# Patient Record
Sex: Female | Born: 1950 | Race: Black or African American | Hispanic: No | Smoking: Never smoker
Health system: Southern US, Community
[De-identification: ages and names within clinical notes are randomized; demographics above are authoritative.]

## PROBLEM LIST (undated history)

## (undated) DIAGNOSIS — I10 Essential (primary) hypertension: Secondary | ICD-10-CM

## (undated) DIAGNOSIS — K219 Gastro-esophageal reflux disease without esophagitis: Secondary | ICD-10-CM

## (undated) DIAGNOSIS — E210 Primary hyperparathyroidism: Secondary | ICD-10-CM

## (undated) DIAGNOSIS — H269 Unspecified cataract: Secondary | ICD-10-CM

## (undated) DIAGNOSIS — M810 Age-related osteoporosis without current pathological fracture: Secondary | ICD-10-CM

## (undated) DIAGNOSIS — C801 Malignant (primary) neoplasm, unspecified: Secondary | ICD-10-CM

## (undated) DIAGNOSIS — G459 Transient cerebral ischemic attack, unspecified: Secondary | ICD-10-CM

## (undated) HISTORY — DX: Essential (primary) hypertension: I10

## (undated) HISTORY — PX: ABDOMINAL HYSTERECTOMY: SHX81

## (undated) HISTORY — DX: Gastro-esophageal reflux disease without esophagitis: K21.9

## (undated) HISTORY — DX: Unspecified cataract: H26.9

---

## 1989-09-01 HISTORY — PX: PARTIAL HYSTERECTOMY: SHX80

## 1997-09-01 DIAGNOSIS — C801 Malignant (primary) neoplasm, unspecified: Secondary | ICD-10-CM

## 1997-09-01 HISTORY — PX: BREAST LUMPECTOMY: SHX2

## 1997-09-01 HISTORY — DX: Malignant (primary) neoplasm, unspecified: C80.1

## 2000-12-26 DIAGNOSIS — G459 Transient cerebral ischemic attack, unspecified: Secondary | ICD-10-CM

## 2000-12-26 HISTORY — DX: Transient cerebral ischemic attack, unspecified: G45.9

## 2006-08-10 ENCOUNTER — Ambulatory Visit (HOSPITAL_COMMUNITY): Admission: RE | Admit: 2006-08-10 | Discharge: 2006-08-10 | Payer: Self-pay | Admitting: Ophthalmology

## 2006-08-17 ENCOUNTER — Ambulatory Visit (HOSPITAL_COMMUNITY): Admission: RE | Admit: 2006-08-17 | Discharge: 2006-08-17 | Payer: Self-pay | Admitting: Ophthalmology

## 2013-03-03 ENCOUNTER — Encounter (HOSPITAL_COMMUNITY): Payer: Self-pay

## 2013-03-03 ENCOUNTER — Ambulatory Visit (HOSPITAL_COMMUNITY)
Admission: RE | Admit: 2013-03-03 | Discharge: 2013-03-03 | Disposition: A | Discharge status: Home / Self Care | Payer: 59 | Source: Ambulatory Visit | Attending: Endocrinology | Admitting: Endocrinology

## 2013-03-03 HISTORY — DX: Age-related osteoporosis without current pathological fracture: M81.0

## 2013-03-03 HISTORY — DX: Malignant (primary) neoplasm, unspecified: C80.1

## 2013-03-03 HISTORY — DX: Transient cerebral ischemic attack, unspecified: G45.9

## 2013-03-03 MED ORDER — SODIUM CHLORIDE 0.9 % IV SOLN
Freq: Once | INTRAVENOUS | Status: AC
  Administered 2013-03-03: 14:00:00 via INTRAVENOUS

## 2013-03-03 MED ORDER — ZOLEDRONIC ACID 4 MG/5ML IV CONC
4.0000 mg | Freq: Once | INTRAVENOUS | Status: AC
  Administered 2013-03-03: 4 mg via INTRAVENOUS
  Filled 2013-03-03: qty 5

## 2013-03-09 ENCOUNTER — Other Ambulatory Visit (HOSPITAL_COMMUNITY): Payer: Self-pay | Admitting: Endocrinology

## 2013-03-11 ENCOUNTER — Emergency Department (HOSPITAL_COMMUNITY)
Admission: EM | Admit: 2013-03-11 | Discharge: 2013-03-11 | Disposition: A | Discharge status: Home / Self Care | Payer: 59 | Source: Home / Self Care | Attending: Emergency Medicine | Admitting: Emergency Medicine

## 2013-03-11 ENCOUNTER — Encounter (HOSPITAL_COMMUNITY): Payer: Self-pay | Admitting: Emergency Medicine

## 2013-03-11 DIAGNOSIS — M79609 Pain in unspecified limb: Secondary | ICD-10-CM

## 2013-03-11 DIAGNOSIS — T50905A Adverse effect of unspecified drugs, medicaments and biological substances, initial encounter: Secondary | ICD-10-CM

## 2013-03-11 DIAGNOSIS — M79605 Pain in left leg: Secondary | ICD-10-CM

## 2013-03-11 DIAGNOSIS — T887XXA Unspecified adverse effect of drug or medicament, initial encounter: Secondary | ICD-10-CM

## 2013-03-11 DIAGNOSIS — M79604 Pain in right leg: Secondary | ICD-10-CM

## 2013-03-11 LAB — MAGNESIUM: Magnesium: 2.6 mg/dL — ABNORMAL HIGH (ref 1.5–2.5)

## 2013-03-11 MED ORDER — ACETAMINOPHEN-CODEINE #3 300-30 MG PO TABS: 1.0000 | ORAL_TABLET | Freq: Four times a day (QID) | ORAL | Status: DC | PRN

## 2013-03-11 NOTE — ED Notes (Signed)
Pt reports pain that starts on left shin and radiates to thigh; has noticed a bump/raise on shin that she's concerned about ... Also has similar sxs on right side but are mild... Reports she was given Zoledronic Acid IV for high calcium on 03/03/13 and was told she would be experiencing joint pain; she's been taking ibup w/temp relief and tyle w/no relief... She is alert w/no signs of acute distress.

## 2013-03-11 NOTE — ED Notes (Signed)
Phone number verified.

## 2013-03-11 NOTE — ED Provider Notes (Addendum)
History    CSN: [id number] Arrival date & time 03/11/13  1910  First MD Initiated Contact with Patient 03/11/13 1930     Chief Complaint  Patient presents with  . Leg Pain   (Consider location/radiation/quality/duration/timing/severity/associated sxs/prior Treatment) HPI Comments: Patient presents urgent care this evening complaining of ongoing muscular pains that have somewhat subsided since Monday with some residual pain on her right lower leg but predominantly hurting the most in her left lower leg. Patient received IV Zoledronic for Hypercalcemia this past Monday. She was told by both the nurse and her Dr. that she was going to probably experience some significant pain. She has taken some ibuprofen some partial relief most of the other areas that have been hurting have improved except for her legs and at this point most pronounced in her left lower extremity.   Patient denies any swelling, any recent trauma or injury.  " I also noticed a little bump ( nontender ) on the anterior aspect of my left lower leg." (Patient points to pretibial region).   Patient is a 62 y.o. female presenting with leg pain. The history is provided by the patient.  Leg Pain Location:  Leg Injury: no   Leg location:  R leg and L leg Pain details:    Quality:  Aching and shooting   Severity:  Moderate   Onset quality:  Sudden   Duration:  5 days   Timing:  Constant Chronicity:  New Dislocation: no   Worsened by:  Bearing weight and activity Associated symptoms: no back pain, no decreased ROM, no fever, no muscle weakness, no numbness, no stiffness, no swelling and no tingling   Risk factors: recent illness    Past Medical History  Diagnosis Date  . 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 TIA (transient ischemic attack)   . Osteoporosis    Past Surgical History  Procedure Laterality Date  . Partial hysterectomy  1991  . Breast lumpectomy  1999    left  . Abdominal hysterectomy     History reviewed.  No pertinent family history. History  Substance Use Topics  . Smoking status: Never Smoker   . Smokeless tobacco: Not on file  . Alcohol Use: No   OB History   Grav Para Term Preterm Abortions TAB SAB Ect Mult Living                 Review of Systems  Constitutional: Negative for fever and chills.  Musculoskeletal: Positive for myalgias and arthralgias. Negative for back pain, joint swelling and stiffness.  Skin: Negative for color change, rash and wound.  Neurological: Negative for dizziness, facial asymmetry and numbness.  Hematological: Negative for adenopathy. Does not bruise/bleed easily.    Allergies  Review of patient's allergies indicates no known allergies.  Home Medications   Current Outpatient Rx  Name  Route  Sig  Dispense  Refill  . Ascorbic Acid (VITAMIN C) 1000 MG tablet   Oral   Take 1,000 mg by mouth daily.         . B Complex-C (B-COMPLEX WITH VITAMIN C) tablet   Oral   Take 1 tablet by mouth daily.         Marland Kitchen BIOTIN 5000 PO               . cholecalciferol (VITAMIN D) 1000 UNITS tablet   Oral   Take 2,000 Units by mouth daily.         . vitamin  B-12 (CYANOCOBALAMIN) 250 MCG tablet   Oral   Take 250 mcg by mouth daily.         . vitamin E (VITAMIN E) 400 UNIT capsule   Oral   Take 400 Units by mouth daily.         Marland Kitchen acetaminophen-codeine (TYLENOL #3) 300-30 MG per tablet   Oral   Take 1-2 tablets by mouth every 6 (six) hours as needed for pain.   15 tablet   0    BP 168/84  Pulse 103  Temp(Src) 99.8 F (37.7 C) (Oral)  Resp 16  SpO2 100% Physical Exam  Nursing note and vitals reviewed. Constitutional: She appears well-developed and well-nourished.  Non-toxic appearance. She does not have a sickly appearance. She does not appear ill. No distress.  Musculoskeletal: She exhibits no edema and no tenderness.       Legs: Neurological: She is alert.  Skin: No rash noted. No erythema.    ED Course  Procedures (including  critical care time) Labs Reviewed  MAGNESIUM - Abnormal; Notable for the following:    Magnesium 2.6 (*)    All other components within normal limits   No results found. 1. Leg pain, left   2. Bilateral leg pain   3. Medication side effect, initial encounter     Renal function and rest of electrolytes within normal Calcium 1.43 Magnesium 2.6 MDM  Bilateral lower extremity pain- in the setting of subsiding myalgias/arthralgias after administration of Zoledronic acid-   Current symptoms and exam were most consistent with a adverse event related to the recently administered Zoledronic Acid- have discussed with patient, that this is the most likely explanation for her ongoing discomfort.  Tonight we are screening her for hypomagnesemia- or other electrolyte disturbances including/ excluding a renal function, disturbance.   Have encouraged patient to continue with ibuprofen- and Tylenol #3 for pain management and to contact her physician if pain continues to persist to further discuss pain management.   Have also instructed patient about symptoms that should warrant her evaluation in the emergency department such as swelling, redness fevers or unilateral lower extremity swelling-we discussed symptoms of a deep vein thrombosis. Patient agrees with treatment plan and followup care with her PCP or the emergency department if symptoms worsen or new symptoms.  Patient has been otherwise if she will be contacted if abnormal test results  Jimmie Molly, MD 03/11/13 1610  Jimmie Molly, MD 03/11/13 2123

## 2013-03-15 LAB — POCT I-STAT, CHEM 8
BUN: 24 mg/dL — ABNORMAL HIGH (ref 6–23)
Creatinine, Ser: 1 mg/dL (ref 0.50–1.10)
HCT: 42 % (ref 36.0–46.0)

## 2013-03-16 ENCOUNTER — Encounter (HOSPITAL_COMMUNITY)
Admission: RE | Admit: 2013-03-16 | Discharge: 2013-03-16 | Disposition: A | Discharge status: Home / Self Care | Payer: 59 | Source: Ambulatory Visit | Attending: Endocrinology | Admitting: Endocrinology

## 2013-03-16 ENCOUNTER — Encounter (HOSPITAL_COMMUNITY): Payer: Self-pay

## 2013-03-16 MED ORDER — TECHNETIUM TC 99M SESTAMIBI GENERIC - CARDIOLITE
25.0000 | Freq: Once | INTRAVENOUS | Status: AC | PRN
  Administered 2013-03-16: 25 via INTRAVENOUS

## 2013-04-22 ENCOUNTER — Encounter (INDEPENDENT_AMBULATORY_CARE_PROVIDER_SITE_OTHER): Payer: Self-pay | Admitting: Surgery

## 2013-04-22 ENCOUNTER — Ambulatory Visit (INDEPENDENT_AMBULATORY_CARE_PROVIDER_SITE_OTHER): Payer: 59 | Admitting: Surgery

## 2013-04-22 VITALS — BP 128/76 | HR 64 | Temp 96.7°F | Resp 14 | Ht 64.0 in | Wt 143.6 lb

## 2013-04-22 DIAGNOSIS — E210 Primary hyperparathyroidism: Secondary | ICD-10-CM | POA: Insufficient documentation

## 2013-04-22 NOTE — Patient Instructions (Signed)

## 2013-04-22 NOTE — Progress Notes (Signed)
General Surgery [facility]  Chief Complaint  Patient presents with  . New Evaluation    primary hyperparathyroidism - referral from Dr. Dorisann Frames    HISTORY: Patient is a 62 year old female referred by her endocrinologist for significant hypercalcemia do to a suspected left sided parathyroid adenoma. Patient had been noted with borderline hypercalcemia for the past 3 years. She presented with extremely high levels greater than 13. She required hospitalization and was treated with intravenous Zometa. Patient underwent nuclear medicine parathyroid scan which localized a left superior parathyroid adenoma. Intact PTH level is elevated at 85.  Patient has no prior history of head or neck surgery. There is no family history of endocrine disease. There is no family history of endocrine neoplasm.  Symptomatically the patient has been diagnosed with osteoporosis and has experienced fatigue.  Past Medical History  Diagnosis Date  . 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 TIA (transient ischemic attack)   . Osteoporosis     Current Outpatient Prescriptions  Medication Sig Dispense Refill  . Ascorbic Acid (VITAMIN C) 1000 MG tablet Take 1,000 mg by mouth daily.      . B Complex-C (B-COMPLEX WITH VITAMIN C) tablet Take 1 tablet by mouth daily.      Marland Kitchen BIOTIN 5000 PO       . cholecalciferol (VITAMIN D) 1000 UNITS tablet Take 2,000 Units by mouth daily.      . vitamin B-12 (CYANOCOBALAMIN) 250 MCG tablet Take 250 mcg by mouth daily.      . vitamin E (VITAMIN E) 400 UNIT capsule Take 400 Units by mouth daily.       No current facility-administered medications for this visit.    No Known Allergies  Family History  Problem Relation Age of Onset  . Cancer Father     prostate    History   Social History  . Marital Status: Divorced    Spouse Name: N/A    Number of Children: N/A  . Years of Education: N/A   Social History Main Topics  . Smoking status: Never Smoker   .  Smokeless tobacco: Never Used  . Alcohol Use: No  . Drug Use: No  . Sexual Activity: None   Other Topics Concern  . None   Social History Narrative  . None    REVIEW OF SYSTEMS - PERTINENT POSITIVES ONLY: Denies tremor. Denies palpitation. Denies compressive symptoms. Notes chronic fatigue. History of osteoporosis. History of bone and joint pain.  EXAM: Filed Vitals:   04/22/13 0926  BP: 128/76  Pulse: 64  Temp: 96.7 F (35.9 C)  Resp: 14    HEENT: normocephalic; pupils equal and reactive; sclerae clear; dentition good; mucous membranes moist NECK:  Palpable soft nodule left thyroid bed, approximately 2 cm; right thyroid bed without palpable abnormality; symmetric on extension; no palpable anterior or posterior cervical lymphadenopathy; no supraclavicular masses; no tenderness CHEST: clear to auscultation bilaterally without rales, rhonchi, or wheezes CARDIAC: regular rate and rhythm without significant murmur; peripheral pulses are full EXT:  non-tender without edema; no deformity NEURO: no gross focal deficits; no sign of tremor   LABORATORY RESULTS: See [facility] (CHL-Epic) for most recent results  RADIOLOGY RESULTS: See [facility] (CHL-Epic) for most recent results  IMPRESSION: Primary hyperparathyroidism, 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 We discussed parathyroid surgery. I have recommended that she undergo parathyroidectomy as an outpatient minimally invasive procedure. We have discussed  the risk and benefits of the procedure. We have discussed the possibility of a second gland adenoma. We have discussed her hospital stay and her postoperative recovery and return to work. She understands and wishes to proceed. I have provided her with written literature to review at home regarding her procedure.  The risks and benefits of the procedure have been discussed at length with the patient.  The patient  understands the proposed procedure, potential alternative treatments, and the course of recovery to be expected.  All of the patient's questions have been answered at this time.  The patient wishes to proceed with surgery.  Velora Heckler, MD, FACS General & Endocrine Surgery [facility]  Primary Care Physician: Alva Garnet., MD

## 2013-04-26 ENCOUNTER — Encounter (HOSPITAL_COMMUNITY): Payer: Self-pay | Admitting: Pharmacy Technician

## 2013-04-29 ENCOUNTER — Encounter (HOSPITAL_COMMUNITY)
Admission: RE | Admit: 2013-04-29 | Discharge: 2013-04-29 | Disposition: A | Discharge status: Home / Self Care | Payer: 59 | Source: Ambulatory Visit | Attending: Surgery | Admitting: Surgery

## 2013-04-29 ENCOUNTER — Encounter (HOSPITAL_COMMUNITY): Payer: Self-pay

## 2013-04-29 DIAGNOSIS — Z01812 Encounter for preprocedural laboratory examination: Secondary | ICD-10-CM | POA: Insufficient documentation

## 2013-04-29 DIAGNOSIS — E210 Primary hyperparathyroidism: Secondary | ICD-10-CM | POA: Insufficient documentation

## 2013-04-29 DIAGNOSIS — Z01810 Encounter for preprocedural cardiovascular examination: Secondary | ICD-10-CM | POA: Insufficient documentation

## 2013-04-29 HISTORY — DX: Primary hyperparathyroidism: E21.0

## 2013-04-29 LAB — CBC
HCT: 41.8 % (ref 36.0–46.0)
Hemoglobin: 14.3 g/dL (ref 12.0–15.0)
MCHC: 34.2 g/dL (ref 30.0–36.0)
MCV: 87.8 fL (ref 78.0–100.0)
Platelets: 185 10*3/uL (ref 150–400)
RDW: 14.6 % (ref 11.5–15.5)

## 2013-04-29 LAB — BASIC METABOLIC PANEL
BUN: 30 mg/dL — ABNORMAL HIGH (ref 6–23)
Calcium: 12.4 mg/dL — ABNORMAL HIGH (ref 8.4–10.5)
Creatinine, Ser: 0.69 mg/dL (ref 0.50–1.10)
Glucose, Bld: 81 mg/dL (ref 70–99)
Sodium: 141 mEq/L (ref 135–145)

## 2013-04-29 NOTE — Pre-Procedure Instructions (Signed)
EKG WAS DONE TODAY PREOP AT [facility]. 

## 2013-04-29 NOTE — Pre-Procedure Instructions (Signed)
PT'S PREOP BUN 30 - BMET REPORT ROUTED TO DR. GERKIN'S EPIC IN [place].

## 2013-04-29 NOTE — Patient Instructions (Signed)
YOUR SURGERY IS SCHEDULED AT [facility]  ON:  Friday  September 5th  REPORT TO [place] SHORT STAY CENTER AT:  11:30 AM      PHONE # FOR SHORT STAY IS [phone]  DO NOT EAT  ANYTHING AFTER MIDNIGHT THE NIGHT BEFORE YOUR SURGERY.  NO FOOD, NO CHEWING GUM, NO MINTS, NO CANDIES, NO CHEWING TOBACCO. YOU MAY HAVE CLEAR LIQUIDS TO DRINK FROM MIDNIGHT UNTIL 7:30 AM DAY OF SURGERY - LIKE WATER.   NOTHING TO DRINK AFTER 7:30 AM DAY OF SURGERY  PLEASE TAKE THE FOLLOWING MEDICATIONS THE AM OF YOUR SURGERY WITH A FEW SIPS OF WATER:  NO MEDS TO TAKE.    DO NOT BRING VALUABLES, MONEY, CREDIT CARDS.  DO NOT WEAR JEWELRY, MAKE-UP, NAIL POLISH AND NO METAL PINS OR CLIPS IN YOUR HAIR. CONTACT LENS, DENTURES / PARTIALS, GLASSES SHOULD NOT BE WORN TO SURGERY AND IN MOST CASES-HEARING AIDS WILL NEED TO BE REMOVED.  BRING YOUR GLASSES CASE, ANY EQUIPMENT NEEDED FOR YOUR CONTACT LENS. FOR PATIENTS ADMITTED TO THE HOSPITAL--CHECK OUT TIME THE DAY OF DISCHARGE IS 11:00 AM.  ALL INPATIENT ROOMS ARE PRIVATE - WITH BATHROOM, TELEPHONE, TELEVISION AND WIFI INTERNET.  IF YOU ARE BEING DISCHARGED THE SAME DAY OF YOUR SURGERY--YOU CAN NOT DRIVE YOURSELF HOME--AND SHOULD NOT GO HOME ALONE BY TAXI OR BUS.  NO DRIVING OR OPERATING MACHINERY FOR 24 HOURS FOLLOWING ANESTHESIA / PAIN MEDICATIONS.  PLEASE MAKE ARRANGEMENTS FOR SOMEONE TO BE WITH YOU AT HOME THE FIRST 24 HOURS AFTER SURGERY. RESPONSIBLE DRIVER'S NAME  JACKIE CLARKE  - PT'S SISTER                                               PHONE #  [phone]                              FAILURE TO FOLLOW THESE INSTRUCTIONS MAY RESULT IN THE CANCELLATION OF YOUR SURGERY.   PATIENT SIGNATURE_________________________________

## 2013-05-06 ENCOUNTER — Encounter (HOSPITAL_COMMUNITY): Payer: Self-pay | Admitting: *Deleted

## 2013-05-06 ENCOUNTER — Ambulatory Visit (HOSPITAL_COMMUNITY)
Admission: RE | Admit: 2013-05-06 | Discharge: 2013-05-06 | Disposition: A | Discharge status: Home / Self Care | Payer: 59 | Source: Ambulatory Visit | Attending: Surgery | Admitting: Surgery

## 2013-05-06 ENCOUNTER — Ambulatory Visit (HOSPITAL_COMMUNITY): Payer: 59 | Admitting: Anesthesiology

## 2013-05-06 ENCOUNTER — Encounter (HOSPITAL_COMMUNITY): Payer: Self-pay | Admitting: Anesthesiology

## 2013-05-06 ENCOUNTER — Encounter (HOSPITAL_COMMUNITY)
Admission: RE | Disposition: A | Discharge status: Home / Self Care | Payer: Self-pay | Source: Ambulatory Visit | Attending: Surgery

## 2013-05-06 DIAGNOSIS — E210 Primary hyperparathyroidism: Secondary | ICD-10-CM | POA: Diagnosis present

## 2013-05-06 DIAGNOSIS — D351 Benign neoplasm of parathyroid gland: Secondary | ICD-10-CM | POA: Insufficient documentation

## 2013-05-06 DIAGNOSIS — G459 Transient cerebral ischemic attack, unspecified: Secondary | ICD-10-CM | POA: Insufficient documentation

## 2013-05-06 DIAGNOSIS — M810 Age-related osteoporosis without current pathological fracture: Secondary | ICD-10-CM | POA: Insufficient documentation

## 2013-05-06 HISTORY — PX: PARATHYROIDECTOMY: SHX19

## 2013-05-06 SURGERY — PARATHYROIDECTOMY
Anesthesia: General | Site: Neck | Wound class: Clean

## 2013-05-06 MED ORDER — FENTANYL CITRATE 0.05 MG/ML IJ SOLN
INTRAMUSCULAR | Status: DC | PRN
  Administered 2013-05-06 (×2): 100 ug via INTRAVENOUS
  Administered 2013-05-06: 50 ug via INTRAVENOUS

## 2013-05-06 MED ORDER — LACTATED RINGERS IV SOLN
INTRAVENOUS | Status: DC
  Administered 2013-05-06: 1000 mL via INTRAVENOUS

## 2013-05-06 MED ORDER — ROCURONIUM BROMIDE 100 MG/10ML IV SOLN
INTRAVENOUS | Status: DC | PRN
  Administered 2013-05-06: 50 mg via INTRAVENOUS

## 2013-05-06 MED ORDER — 0.9 % SODIUM CHLORIDE (POUR BTL) OPTIME
TOPICAL | Status: DC | PRN
  Administered 2013-05-06: 1000 mL

## 2013-05-06 MED ORDER — ONDANSETRON HCL 4 MG/2ML IJ SOLN
INTRAMUSCULAR | Status: DC | PRN
  Administered 2013-05-06: 4 mg via INTRAVENOUS

## 2013-05-06 MED ORDER — HYDROCODONE-ACETAMINOPHEN 5-325 MG PO TABS: 1.0000 | ORAL_TABLET | ORAL | Status: DC | PRN

## 2013-05-06 MED ORDER — PROMETHAZINE HCL 25 MG/ML IJ SOLN
6.2500 mg | INTRAMUSCULAR | Status: DC | PRN
  Administered 2013-05-06: 6.25 mg via INTRAVENOUS
  Filled 2013-05-06: qty 1

## 2013-05-06 MED ORDER — BUPIVACAINE HCL (PF) 0.5 % IJ SOLN
INTRAMUSCULAR | Status: AC
  Filled 2013-05-06: qty 30

## 2013-05-06 MED ORDER — LIDOCAINE HCL (PF) 2 % IJ SOLN
INTRAMUSCULAR | Status: DC | PRN
  Administered 2013-05-06: 75 mg

## 2013-05-06 MED ORDER — NEOSTIGMINE METHYLSULFATE 1 MG/ML IJ SOLN
INTRAMUSCULAR | Status: DC | PRN
  Administered 2013-05-06: 4 mg via INTRAVENOUS

## 2013-05-06 MED ORDER — MIDAZOLAM HCL 5 MG/5ML IJ SOLN
INTRAMUSCULAR | Status: DC | PRN
  Administered 2013-05-06: 2 mg via INTRAVENOUS

## 2013-05-06 MED ORDER — PROPOFOL 10 MG/ML IV BOLUS
INTRAVENOUS | Status: DC | PRN
  Administered 2013-05-06: 150 mg via INTRAVENOUS

## 2013-05-06 MED ORDER — GLYCOPYRROLATE 0.2 MG/ML IJ SOLN
INTRAMUSCULAR | Status: DC | PRN
  Administered 2013-05-06: 0.6 mg via INTRAVENOUS

## 2013-05-06 MED ORDER — CEFAZOLIN SODIUM-DEXTROSE 2-3 GM-% IV SOLR
2.0000 g | INTRAVENOUS | Status: AC
  Administered 2013-05-06: 2 g via INTRAVENOUS

## 2013-05-06 MED ORDER — HYDROMORPHONE HCL PF 1 MG/ML IJ SOLN
INTRAMUSCULAR | Status: AC
  Filled 2013-05-06: qty 1

## 2013-05-06 MED ORDER — BUPIVACAINE HCL (PF) 0.5 % IJ SOLN
INTRAMUSCULAR | Status: DC | PRN
  Administered 2013-05-06: 10 mL

## 2013-05-06 MED ORDER — CEFAZOLIN SODIUM-DEXTROSE 2-3 GM-% IV SOLR
INTRAVENOUS | Status: AC
  Filled 2013-05-06: qty 50

## 2013-05-06 MED ORDER — HYDROMORPHONE HCL PF 1 MG/ML IJ SOLN
0.2500 mg | INTRAMUSCULAR | Status: DC | PRN
  Administered 2013-05-06 (×2): 0.5 mg via INTRAVENOUS

## 2013-05-06 SURGICAL SUPPLY — 38 items
APL SKNCLS STERI-STRIP NONHPOA (GAUZE/BANDAGES/DRESSINGS) ×1
ATTRACTOMAT 16X20 MAGNETIC DRP (DRAPES) ×2 IMPLANT
BENZOIN TINCTURE PRP APPL 2/3 (GAUZE/BANDAGES/DRESSINGS) ×2 IMPLANT
BLADE HEX COATED 2.75 (ELECTRODE) ×2 IMPLANT
BLADE SURG 15 STRL LF DISP TIS (BLADE) ×1 IMPLANT
BLADE SURG 15 STRL SS (BLADE) ×2
CANISTER SUCTION 2500CC (MISCELLANEOUS) ×2 IMPLANT
CHLORAPREP W/TINT 10.5 ML (MISCELLANEOUS) ×2 IMPLANT
CLIP TI MEDIUM 6 (CLIP) ×4 IMPLANT
CLIP TI WIDE RED SMALL 6 (CLIP) ×4 IMPLANT
CLOTH BEACON ORANGE TIMEOUT ST (SAFETY) ×2 IMPLANT
DRAPE PED LAPAROTOMY (DRAPES) ×2 IMPLANT
DRESSING SURGICEL FIBRLLR 1X2 (HEMOSTASIS) ×1 IMPLANT
DRSG SURGICEL FIBRILLAR 1X2 (HEMOSTASIS) ×2
ELECT REM PT RETURN 9FT ADLT (ELECTROSURGICAL) ×2
ELECTRODE REM PT RTRN 9FT ADLT (ELECTROSURGICAL) ×1 IMPLANT
GAUZE SPONGE 4X4 16PLY XRAY LF (GAUZE/BANDAGES/DRESSINGS) ×2 IMPLANT
GLOVE SURG ORTHO 8.0 STRL STRW (GLOVE) ×2 IMPLANT
GOWN STRL REIN XL XLG (GOWN DISPOSABLE) ×6 IMPLANT
KIT BASIN OR (CUSTOM PROCEDURE TRAY) ×2 IMPLANT
NDL HYPO 25X1 1.5 SAFETY (NEEDLE) ×1 IMPLANT
NEEDLE HYPO 25X1 1.5 SAFETY (NEEDLE) ×2 IMPLANT
NS IRRIG 1000ML POUR BTL (IV SOLUTION) ×1 IMPLANT
PACK BASIC VI WITH GOWN DISP (CUSTOM PROCEDURE TRAY) ×2 IMPLANT
PENCIL BUTTON HOLSTER BLD 10FT (ELECTRODE) ×2 IMPLANT
STAPLER VISISTAT 35W (STAPLE) ×1 IMPLANT
STRIP CLOSURE SKIN 1/2X4 (GAUZE/BANDAGES/DRESSINGS) ×2 IMPLANT
SUT MNCRL AB 4-0 PS2 18 (SUTURE) ×2 IMPLANT
SUT SILK 2 0 (SUTURE)
SUT SILK 2-0 18XBRD TIE 12 (SUTURE) IMPLANT
SUT SILK 3 0 (SUTURE)
SUT SILK 3-0 18XBRD TIE 12 (SUTURE) IMPLANT
SUT VIC AB 3-0 SH 18 (SUTURE) ×2 IMPLANT
SYR BULB IRRIGATION 50ML (SYRINGE) ×2 IMPLANT
SYR CONTROL 10ML LL (SYRINGE) ×2 IMPLANT
TOWEL OR 17X26 10 PK STRL BLUE (TOWEL DISPOSABLE) ×2 IMPLANT
TOWEL OR NON WOVEN STRL DISP B (DISPOSABLE) ×2 IMPLANT
YANKAUER SUCT BULB TIP 10FT TU (MISCELLANEOUS) ×2 IMPLANT

## 2013-05-06 NOTE — Op Note (Signed)
OPERATIVE REPORT - PARATHYROIDECTOMY  Preoperative diagnosis: Primary hyperparathyroidism  Postop diagnosis: Same  Procedure: Left superior minimally invasive parathyroidectomy  Surgeon:  Velora Heckler, MD, FACS  Anesthesia: Gen. endotracheal  Estimated blood loss: Minimal  Preparation: ChloraPrep  Indications: Patient is a 62 yo BF with elevated calcium and PTH levels.  Nuclear med scan localizes to the left superior position.  Procedure: Patient was prepared in the holding area. He was brought to operating room and placed in a supine position on the operating room table. Following administration of general anesthesia, the patient was positioned and then prepped and draped in the usual strict aseptic fashion. After ascertaining that an adequate level of anesthesia been achieved, a neck incision was made with a #15 blade. Dissection was carried through subcutaneous tissues and platysma. Hemostasis was obtained with the electrocautery. Skin flaps were developed circumferentially and a Weitlander retractor was placed for exposure.  Strap muscles were incised in the midline. Strap muscles were reflected exposing the thyroid lobe. A markedly enlarged parathyroid gland was encountered in the left superior position.  It was gently mobilized. Vascular structures were divided between small and medium ligaclips. The parathyroid gland was completely excised. It was submitted to pathology where frozen section confirmed hypercellular parathyroid tissue consistent with adenoma weighing over 5 grams.  Neck was irrigated with warm saline and good hemostasis was noted. Fibrillar was placed in the operative field. Strap muscles were reapproximated in the midline with interrupted 3-0 Vicryl sutures. Platysma was closed with interrupted 3-0 Vicryl sutures. Skin was closed with a running 4-0 Monocryl subcuticular suture. Marcaine was infiltrated circumferentially. Wound was washed and dried and benzoin and  Steri-Strips were applied. Sterile gauze dressings were applied. Patient was awakened from anesthesia and brought to the recovery room. The patient tolerated the procedure well.   Velora Heckler, MD, FACS General & Endocrine Surgery [facility]

## 2013-05-06 NOTE — Interval H&P Note (Signed)
History and Physical Interval Note:  05/06/2013 1:19 PM  Adrienne Lopez  has presented today for surgery, with the diagnosis of Primary hyperparathyroidism.  The various methods of treatment have been discussed with the patient and family. After consideration of risks, benefits and other options for treatment, the patient has consented to    Procedure(s): PARATHYROIDECTOMY (N/A) as a surgical intervention .    The patient's history has been reviewed, patient examined, no change in status, stable for surgery.  I have reviewed the patient's chart and labs.  Questions were answered to the patient's satisfaction.    Velora Heckler, MD, [facility] Office: [phone]    Yu Cragun Judie Petit

## 2013-05-06 NOTE — Anesthesia Postprocedure Evaluation (Signed)
  Anesthesia Post-op Note  Patient: Adrienne Lopez  Procedure(s) Performed: Procedure(s) (LRB): PARATHYROIDECTOMY (N/A)  Patient Location: PACU  Anesthesia Type: General  Level of Consciousness: awake and alert   Airway and Oxygen Therapy: Patient Spontanous Breathing  Post-op Pain: mild  Post-op Assessment: Post-op Vital signs reviewed, Patient's Cardiovascular Status Stable, Respiratory Function Stable, Patent Airway and No signs of Nausea or vomiting  Last Vitals:  Filed Vitals:   05/06/13 1545  BP: 145/78  Pulse: 52  Temp: 36.4 C  Resp: 11    Post-op Vital Signs: stable   Complications: No apparent anesthesia complications

## 2013-05-06 NOTE — Transfer of Care (Signed)
Immediate Anesthesia Transfer of Care Note  Patient: Adrienne Lopez  Procedure(s) Performed: Procedure(s): PARATHYROIDECTOMY (N/A)  Patient Location: PACU  Anesthesia Type:General  Level of Consciousness: awake, sedated, patient cooperative and responds to stimulation  Airway & Oxygen Therapy: Patient Spontanous Breathing and Patient connected to face mask oxygen  Post-op Assessment: Report given to PACU RN and Post -op Vital signs reviewed and stable  Post vital signs: Reviewed and stable  Complications: No apparent anesthesia complications

## 2013-05-06 NOTE — H&P (View-Only) (Signed)
General Surgery - [facility]  Chief Complaint  Patient presents with  . New Evaluation    primary hyperparathyroidism - referral from Dr. Bindubal Balan    HISTORY: Patient is a 62-year-old female referred by her endocrinologist for significant hypercalcemia do to a suspected left sided parathyroid adenoma. Patient had been noted with borderline hypercalcemia for the past 3 years. She presented with extremely high levels greater than 13. She required hospitalization and was treated with intravenous Zometa. Patient underwent nuclear medicine parathyroid scan which localized a left superior parathyroid adenoma. Intact PTH level is elevated at 85.  Patient has no prior history of head or neck surgery. There is no family history of endocrine disease. There is no family history of endocrine neoplasm.  Symptomatically the patient has been diagnosed with osteoporosis and has experienced fatigue.  Past Medical History  Diagnosis Date  . 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 TIA (transient ischemic attack)   . Osteoporosis     Current Outpatient Prescriptions  Medication Sig Dispense Refill  . Ascorbic Acid (VITAMIN C) 1000 MG tablet Take 1,000 mg by mouth daily.      . B Complex-C (B-COMPLEX WITH VITAMIN C) tablet Take 1 tablet by mouth daily.      . BIOTIN 5000 PO       . cholecalciferol (VITAMIN D) 1000 UNITS tablet Take 2,000 Units by mouth daily.      . vitamin B-12 (CYANOCOBALAMIN) 250 MCG tablet Take 250 mcg by mouth daily.      . vitamin E (VITAMIN E) 400 UNIT capsule Take 400 Units by mouth daily.       No current facility-administered medications for this visit.    No Known Allergies  Family History  Problem Relation Age of Onset  . Cancer Father     prostate    History   Social History  . Marital Status: Divorced    Spouse Name: N/A    Number of Children: N/A  . Years of Education: N/A   Social History Main Topics  . Smoking status: Never Smoker   .  Smokeless tobacco: Never Used  . Alcohol Use: No  . Drug Use: No  . Sexual Activity: None   Other Topics Concern  . None   Social History Narrative  . None    REVIEW OF SYSTEMS - PERTINENT POSITIVES ONLY: Denies tremor. Denies palpitation. Denies compressive symptoms. Notes chronic fatigue. History of osteoporosis. History of bone and joint pain.  EXAM: Filed Vitals:   04/22/13 0926  BP: 128/76  Pulse: 64  Temp: 96.7 F (35.9 C)  Resp: 14    HEENT: normocephalic; pupils equal and reactive; sclerae clear; dentition good; mucous membranes moist NECK:  Palpable soft nodule left thyroid bed, approximately 2 cm; right thyroid bed without palpable abnormality; symmetric on extension; no palpable anterior or posterior cervical lymphadenopathy; no supraclavicular masses; no tenderness CHEST: clear to auscultation bilaterally without rales, rhonchi, or wheezes CARDIAC: regular rate and rhythm without significant murmur; peripheral pulses are full EXT:  non-tender without edema; no deformity NEURO: no gross focal deficits; no sign of tremor   LABORATORY RESULTS: See [facility] (CHL-Epic) for most recent results  RADIOLOGY RESULTS: See [facility] (CHL-Epic) for most recent results  IMPRESSION: Primary hyperparathyroidism, 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 We discussed parathyroid surgery. I have recommended that she undergo parathyroidectomy as an outpatient minimally invasive procedure. We have discussed   the risk and benefits of the procedure. We have discussed the possibility of a second gland adenoma. We have discussed her hospital stay and her postoperative recovery and return to work. She understands and wishes to proceed. I have provided her with written literature to review at home regarding her procedure.  The risks and benefits of the procedure have been discussed at length with the patient.  The patient  understands the proposed procedure, potential alternative treatments, and the course of recovery to be expected.  All of the patient's questions have been answered at this time.  The patient wishes to proceed with surgery.  Mame Twombly M. Jenalee Trevizo, MD, FACS General & Endocrine Surgery [facility]  Primary Care Physician: SHELTON,KIMBERLY R., MD   

## 2013-05-06 NOTE — Anesthesia Preprocedure Evaluation (Signed)
Anesthesia Evaluation  Patient identified by MRN, date of birth, ID band Patient awake    Reviewed: Allergy & Precautions, H&P , NPO status , Patient's Chart, lab work & pertinent test results  Airway Mallampati: II TM Distance: >3 FB Neck ROM: Full    Dental no notable dental hx.    Pulmonary neg pulmonary ROS,  breath sounds clear to auscultation  Pulmonary exam normal       Cardiovascular Exercise Tolerance: Good negative cardio ROS  Rhythm:Regular Rate:Normal     Neuro/Psych TIAnegative psych ROS   GI/Hepatic negative GI ROS, Neg liver ROS,   Endo/Other  negative endocrine ROS  Renal/GU negative Renal ROS  negative genitourinary   Musculoskeletal negative musculoskeletal ROS (+)   Abdominal   Peds negative pediatric ROS (+)  Hematology negative hematology ROS (+)   Anesthesia Other Findings   Reproductive/Obstetrics negative OB ROS                           Anesthesia Physical Anesthesia Plan  ASA: II  Anesthesia Plan: General   Post-op Pain Management:    Induction: Intravenous  Airway Management Planned: Oral ETT  Additional Equipment:   Intra-op Plan:   Post-operative Plan: Extubation in OR  Informed Consent: I have reviewed the patients History and Physical, chart, labs and discussed the procedure including the risks, benefits and alternatives for the proposed anesthesia with the patient or authorized representative who has indicated his/her understanding and acceptance.   Dental advisory given  Plan Discussed with: CRNA  Anesthesia Plan Comments:         Anesthesia Quick Evaluation

## 2013-05-09 ENCOUNTER — Encounter (HOSPITAL_COMMUNITY): Payer: Self-pay | Admitting: Surgery

## 2013-05-12 ENCOUNTER — Telehealth (INDEPENDENT_AMBULATORY_CARE_PROVIDER_SITE_OTHER): Payer: Self-pay

## 2013-05-12 NOTE — Telephone Encounter (Signed)
LMOM for pt to call. Pt will need labs prior to 9-23 ov. Lab slip mailed to pt for [facility] on 09-19.

## 2013-05-12 NOTE — Telephone Encounter (Signed)
Patient called back and was told that she will have a letter mailed to her home to tell her to do lab on 05-20-13 before her office visit on 05-24-13

## 2013-05-24 ENCOUNTER — Ambulatory Visit (INDEPENDENT_AMBULATORY_CARE_PROVIDER_SITE_OTHER): Payer: 59 | Admitting: Surgery

## 2013-05-24 ENCOUNTER — Encounter (INDEPENDENT_AMBULATORY_CARE_PROVIDER_SITE_OTHER): Payer: Self-pay | Admitting: Surgery

## 2013-05-24 VITALS — BP 128/76 | HR 72 | Temp 97.4°F | Resp 14 | Ht 64.5 in | Wt 145.8 lb

## 2013-05-24 DIAGNOSIS — E210 Primary hyperparathyroidism: Secondary | ICD-10-CM

## 2013-05-24 NOTE — Progress Notes (Signed)
General Surgery [facility]  Chief Complaint  Patient presents with  . Routine Post Op    parathyroidectomy 05/06/2013    HISTORY: Patient is a 62 year old female who underwent minimally invasive parathyroidectomy on 05/06/2013. Final pathology shows a very large parathyroid adenoma weighing 5.5 g and measuring 3.0 cm in greatest dimension. Postop calcium level has returned to normal at 9.0 on her most recent laboratory studies.  EXAM: Surgical incision is healing nicely. A small suture tag is removed from the medial end of the incision. No sign of seroma. No sign of infection. Voice quality is normal.  IMPRESSION: Status post minimally invasive parathyroidectomy  PLAN: Patient will begin applying topical creams to her incision. She will return for a final wound check in 6 weeks. We will check an intact PTH level and serum calcium level prior to that office visit.  Velora Heckler, MD, FACS General & Endocrine Surgery [facility]   Visit Diagnoses: 1. Hyperparathyroidism, primary

## 2013-05-24 NOTE — Patient Instructions (Signed)
  COCOA BUTTER & VITAMIN E CREAM  ([employer] or other brand)  Apply cocoa butter/vitamin E cream to your incision 2 - 3 times daily.  Massage cream into incision for one minute with each application.  Use sunscreen (50 SPF or higher) for first 6 months after surgery if area is exposed to sun.  You may substitute Mederma or other scar reducing creams as desired.   

## 2013-06-10 ENCOUNTER — Telehealth (INDEPENDENT_AMBULATORY_CARE_PROVIDER_SITE_OTHER): Payer: Self-pay | Admitting: *Deleted

## 2013-06-10 NOTE — Telephone Encounter (Signed)
Below reviewed:  Consuelo Pandy, RN at 06/10/2013 8:36 AM   Status: Signed            Patient called this morning to state that for about the last week she has been having heart palpitations. Patient had a parathyroidectomy on 05/06/13. Explained to patient that I would send a message to Dr. Gerrit Friends to find out what the best plan would be then give her a call back as soon as I know. Patient states understanding.    Patient has already been seen post op and has been doing well from her surgery.  Post op calcium level is normal at 9.0.  If patient has developed new symptoms (palpitations), she should contact her primary physician for evaluation.  Velora Heckler, MD, [facility] Office: [phone]

## 2013-06-10 NOTE — Telephone Encounter (Signed)
Patient called this morning to state that for about the last week she has been having heart palpitations.  Patient had a parathyroidectomy on 05/06/13.  Explained to patient that I would send a message to Dr. Gerrit Friends to find out what the best plan would be then give her a call back as soon as I know.  Patient states understanding.

## 2013-06-10 NOTE — Telephone Encounter (Signed)
Patient updated with Dr. Ardine Eng advise at this time.  Patient states understanding and agreeable at this time.

## 2013-06-30 ENCOUNTER — Encounter: Payer: Self-pay | Admitting: Internal Medicine

## 2013-06-30 ENCOUNTER — Ambulatory Visit (INDEPENDENT_AMBULATORY_CARE_PROVIDER_SITE_OTHER): Payer: 59 | Admitting: Internal Medicine

## 2013-06-30 VITALS — BP 138/62 | HR 81 | Ht 64.5 in | Wt 147.6 lb

## 2013-06-30 DIAGNOSIS — R0609 Other forms of dyspnea: Secondary | ICD-10-CM

## 2013-06-30 DIAGNOSIS — R002 Palpitations: Secondary | ICD-10-CM

## 2013-06-30 DIAGNOSIS — R0600 Dyspnea, unspecified: Secondary | ICD-10-CM | POA: Insufficient documentation

## 2013-06-30 NOTE — Progress Notes (Signed)
OFFICE NOTE  Chief Complaint:  Palpitations, mild dyspnea  Primary Care Physician: Adrienne Lopez., MD  HPI:  Adrienne Lopez is a pleasant 62 year old female from [place]. She was noted to be hypercalcemic and ultimately diagnosed with hyperparathyroidism. She underwent parathyroid surgery by Dr. Gerrit Lopez in September. She did well since then however subsequently developed palpitations. She describes the palpitations as a hard thumping in her chest and neck were occurring several times a day after surgery. The symptoms have improved somewhat and it seems to be coincidental with her taking aspirin.  She does have a history of TIA in the past, but the episode was unclear as she "blacked out" when she was taking medication for breast cancer, specifically tamoxifen. She was taken off of that at that time. She did have a history of breast cancer and underwent chest wall radiation in 1999 but has been free of recurrence. Otherwise she has no other medical history and is currently not on any medications other than aspirin. He recently had blood work through Dr. Theora Lopez office which indicates her thyroid function is low normal but her parathyroid is within normal limits and her calcium is now 9.3. She reports her palpitations occur a couple times a day and feel like a strong beating of the heart that causes her some mild shortness of breath. She denies any chest pain or anginal symptoms. There is no significant family history of heart disease.   PMHx:  Past Medical History  Diagnosis Date  . 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 TIA (transient ischemic attack) 12/26/2000    NO RESIDUAL PROBLEM  . Osteoporosis   . Hyperparathyroidism, primary     Past Surgical History  Procedure Laterality Date  . Partial hysterectomy  1991  . Abdominal hysterectomy    . Breast lumpectomy  1999    left  . Parathyroidectomy N/A 05/06/2013    Procedure: PARATHYROIDECTOMY;  Surgeon: Adrienne Heckler, MD;  Location: [facility];   Service: General;  Laterality: N/A;    FAMHx:  Family History  Problem Relation Age of Onset  . Prostate cancer Father   . Heart attack Maternal Grandmother   . Cancer Maternal Grandfather     oral cancer  . Hypertension Mother     SOCHx:   reports that she has never smoked. She has never used smokeless tobacco. She reports that she does not drink alcohol or use illicit drugs.  ALLERGIES:  No Known Allergies  ROS: A comprehensive review of systems was negative except for: Constitutional: positive for fatigue Cardiovascular: positive for dyspnea and palpitations  HOME MEDS: Current Outpatient Prescriptions  Medication Sig Dispense Refill  . Ascorbic Acid (VITAMIN C) 1000 MG tablet Take 1,000 mg by mouth daily.      Marland Kitchen aspirin 81 MG tablet Take 81 mg by mouth daily.      . B Complex-C (B-COMPLEX WITH VITAMIN C) tablet Take 1 tablet by mouth daily.      Marland Kitchen BIOTIN 5000 PO Take 1 tablet by mouth daily.       . cholecalciferol (VITAMIN D) 1000 UNITS tablet Take 2,000 Units by mouth daily.      . vitamin B-12 (CYANOCOBALAMIN) 250 MCG tablet Take 2,500 mcg by mouth daily.       . vitamin E (VITAMIN E) 400 UNIT capsule Take 400 Units by mouth daily.       No current facility-administered medications for this visit.    LABS/IMAGING: No results found for this  or any previous visit (from the past 48 hour(s)). No results found.  VITALS: BP 138/62  Pulse 81  Ht 5' 4.5" (1.638 m)  Wt 147 lb 9.6 oz (66.951 kg)  BMI 24.95 kg/m2  EXAM: General appearance: alert and no distress Neck: no carotid bruit and no JVD Lungs: clear to auscultation bilaterally Heart: regular rate and rhythm, S1, S2 normal, no murmur, click, rub or gallop Abdomen: soft, non-tender; bowel sounds normal; no masses,  no organomegaly Extremities: extremities normal, atraumatic, no cyanosis or edema Pulses: 2+ and symmetric Skin: Skin color, texture, turgor normal. No rashes or lesions Neurologic: Grossly  normal Psych: Mood, affect normal  EKG: Normal sinus rhythm at 81, no evidence for preexcitation  ASSESSMENT: 1. Palpitations 2. Mild dyspnea associated with palpitations 3. Recent hyperparathyroidism status post surgery 4. History of TIA 5. History of Breast CA s/p chemotherapy and chest wall radiation  PLAN: 1.   Adrienne Lopez is describing palpitations that sound descriptive of PVCs. Her symptoms were much more significant after surgery, possibly related to the release of parathyroid hormone. Her calcium has normalized and she equates this with starting aspirin, however I cannot see a clear mechanism and lesser was ischemia. She really does not describe any ischemic symptoms however and her EKG is nonischemic. I would like to check an echocardiogram as there is some associated shortness of breath and to look for any valvular abnormalities, cardiomyopathy or anything related to her prior chemotherapy and/or chest wall radiation. In addition, like to spit her for a 48 hour Holter monitor to get some idea what the palpitations are. She will keep a journal of her symptoms in addition to this. We did discuss caffeine and she is actually very infrequent caffeine user. She does do drink herbal tea regularly, but reports it does not contain caffeine.  Plan to see her back in a few weeks to discuss results of her echo and her Holter monitor.  Thank you again for the kind referral.  Chrystie Nose, MD, [facility] Attending Cardiologist [facility]  Freddie Dymek C 06/30/2013, 10:37 AM

## 2013-06-30 NOTE — Patient Instructions (Signed)
Your physician has requested that you have an echocardiogram. Echocardiography is a painless test that uses sound waves to create images of your heart. It provides your doctor with information about the size and shape of your heart and how well your heart's chambers and valves are working. This procedure takes approximately one hour. There are no restrictions for this procedure.  You will wear a Holter monitor for 48 hours - this provides a continuous recording of your heart rate & rhythm.   Please schedule a follow up visit with Dr. Rennis Golden after your echocardiogram.

## 2013-07-07 ENCOUNTER — Other Ambulatory Visit: Payer: Self-pay

## 2013-07-07 ENCOUNTER — Encounter (INDEPENDENT_AMBULATORY_CARE_PROVIDER_SITE_OTHER): Payer: 59 | Admitting: Surgery

## 2013-07-08 ENCOUNTER — Emergency Department (HOSPITAL_COMMUNITY): Payer: 59

## 2013-07-08 ENCOUNTER — Observation Stay (HOSPITAL_COMMUNITY)
Admission: EM | Admit: 2013-07-08 | Discharge: 2013-07-09 | Disposition: A | Discharge status: Home / Self Care | Payer: 59 | Attending: Internal Medicine | Admitting: Internal Medicine

## 2013-07-08 DIAGNOSIS — Z923 Personal history of irradiation: Secondary | ICD-10-CM | POA: Insufficient documentation

## 2013-07-08 DIAGNOSIS — Z79899 Other long term (current) drug therapy: Secondary | ICD-10-CM | POA: Insufficient documentation

## 2013-07-08 DIAGNOSIS — Z7982 Long term (current) use of aspirin: Secondary | ICD-10-CM | POA: Insufficient documentation

## 2013-07-08 DIAGNOSIS — Z8673 Personal history of transient ischemic attack (TIA), and cerebral infarction without residual deficits: Secondary | ICD-10-CM | POA: Insufficient documentation

## 2013-07-08 DIAGNOSIS — R0600 Dyspnea, unspecified: Secondary | ICD-10-CM

## 2013-07-08 DIAGNOSIS — M810 Age-related osteoporosis without current pathological fracture: Secondary | ICD-10-CM | POA: Insufficient documentation

## 2013-07-08 DIAGNOSIS — R9431 Abnormal electrocardiogram [ECG] [EKG]: Secondary | ICD-10-CM | POA: Insufficient documentation

## 2013-07-08 DIAGNOSIS — R079 Chest pain, unspecified: Principal | ICD-10-CM

## 2013-07-08 DIAGNOSIS — Z853 Personal history of malignant neoplasm of breast: Secondary | ICD-10-CM | POA: Insufficient documentation

## 2013-07-08 DIAGNOSIS — R002 Palpitations: Secondary | ICD-10-CM

## 2013-07-08 DIAGNOSIS — E210 Primary hyperparathyroidism: Secondary | ICD-10-CM

## 2013-07-08 LAB — CBC WITH DIFFERENTIAL/PLATELET
Eosinophils Absolute: 0.2 10*3/uL (ref 0.0–0.7)
Eosinophils Relative: 2 % (ref 0–5)
HCT: 41.2 % (ref 36.0–46.0)
Hemoglobin: 13.5 g/dL (ref 12.0–15.0)
Lymphs Abs: 2.2 10*3/uL (ref 0.7–4.0)
MCH: 28.5 pg (ref 26.0–34.0)
MCV: 87.1 fL (ref 78.0–100.0)
Monocytes Relative: 14 % — ABNORMAL HIGH (ref 3–12)
RBC: 4.73 MIL/uL (ref 3.87–5.11)

## 2013-07-08 LAB — BASIC METABOLIC PANEL
BUN: 26 mg/dL — ABNORMAL HIGH (ref 6–23)
Calcium: 9.8 mg/dL (ref 8.4–10.5)
GFR calc non Af Amer: 88 mL/min — ABNORMAL LOW (ref >90)
Glucose, Bld: 107 mg/dL — ABNORMAL HIGH (ref 70–99)
Potassium: 4.4 mEq/L (ref 3.5–5.1)

## 2013-07-08 LAB — POCT I-STAT TROPONIN I: Troponin i, poc: 0.01 ng/mL (ref 0.00–0.08)

## 2013-07-08 LAB — HEPATIC FUNCTION PANEL
AST: 38 U/L — ABNORMAL HIGH (ref 0–37)
Bilirubin, Direct: <0.1 mg/dL (ref 0.0–0.3)
Total Bilirubin: 0.2 mg/dL — ABNORMAL LOW (ref 0.3–1.2)

## 2013-07-08 MED ORDER — SODIUM CHLORIDE 0.9 % IJ SOLN: 3.0000 mL | INTRAMUSCULAR | Status: DC | PRN

## 2013-07-08 MED ORDER — HYDROCODONE-ACETAMINOPHEN 5-325 MG PO TABS: 1.0000 | ORAL_TABLET | ORAL | Status: DC | PRN

## 2013-07-08 MED ORDER — ASPIRIN 81 MG PO CHEW
324.0000 mg | CHEWABLE_TABLET | Freq: Once | ORAL | Status: AC
  Administered 2013-07-08: 324 mg via ORAL
  Filled 2013-07-08: qty 4

## 2013-07-08 MED ORDER — SODIUM CHLORIDE 0.9 % IJ SOLN: 3.0000 mL | Freq: Two times a day (BID) | INTRAMUSCULAR | Status: DC

## 2013-07-08 MED ORDER — ENOXAPARIN SODIUM 40 MG/0.4ML ~~LOC~~ SOLN
40.0000 mg | SUBCUTANEOUS | Status: DC
  Filled 2013-07-08 (×2): qty 0.4

## 2013-07-08 MED ORDER — PANTOPRAZOLE SODIUM 40 MG PO TBEC
40.0000 mg | DELAYED_RELEASE_TABLET | Freq: Two times a day (BID) | ORAL | Status: DC
  Administered 2013-07-09: 40 mg via ORAL
  Filled 2013-07-08 (×3): qty 1

## 2013-07-08 MED ORDER — ONDANSETRON HCL 4 MG PO TABS: 4.0000 mg | ORAL_TABLET | Freq: Four times a day (QID) | ORAL | Status: DC | PRN

## 2013-07-08 MED ORDER — ALUM & MAG HYDROXIDE-SIMETH 200-200-20 MG/5ML PO SUSP: 30.0000 mL | Freq: Four times a day (QID) | ORAL | Status: DC | PRN

## 2013-07-08 MED ORDER — ALBUTEROL SULFATE (5 MG/ML) 0.5% IN NEBU: 2.5000 mg | INHALATION_SOLUTION | RESPIRATORY_TRACT | Status: DC | PRN

## 2013-07-08 MED ORDER — ACETAMINOPHEN 325 MG PO TABS: 650.0000 mg | ORAL_TABLET | Freq: Four times a day (QID) | ORAL | Status: DC | PRN

## 2013-07-08 MED ORDER — ACETAMINOPHEN 650 MG RE SUPP: 650.0000 mg | Freq: Four times a day (QID) | RECTAL | Status: DC | PRN

## 2013-07-08 MED ORDER — SODIUM CHLORIDE 0.9 % IV SOLN: 250.0000 mL | INTRAVENOUS | Status: DC | PRN

## 2013-07-08 MED ORDER — ONDANSETRON HCL 4 MG/2ML IJ SOLN: 4.0000 mg | Freq: Four times a day (QID) | INTRAMUSCULAR | Status: DC | PRN

## 2013-07-08 MED ORDER — ASPIRIN EC 325 MG PO TBEC
325.0000 mg | DELAYED_RELEASE_TABLET | Freq: Every day | ORAL | Status: DC
  Filled 2013-07-08 (×2): qty 1

## 2013-07-08 NOTE — ED Provider Notes (Signed)
CSN: [id number]     Arrival date & time 07/08/13  9147 History   First MD Initiated Contact with Patient 07/08/13 1856     Chief Complaint  Patient presents with  . Chest Pain   (Consider location/radiation/quality/duration/timing/severity/associated sxs/prior Treatment) HPI Comments: 62 year old female presents with intermittent chest pain since this morning. States it occurred about 17 hours ago. At 2 AM it woke her up out of sleep feeling like a burning pain. It has come and gone, but she does not know inciting or relieving factors. States is not a pleuritic pain. Has not seemed to get worse with exertion or food. Denies any nausea or vomiting. Is not worse with sitting up or lying back. Denies any shortness of breath. She has seen a cardiologist, Dr. Rennis Golden, recently for palpitations. The palpitations have continued and she's not know the cause. Palpitations do not seem to correlate with this chest pain. The pain is currently resolved. Most recently the pain is felt as both a burning and a tightness.   Past Medical History  Diagnosis Date  . 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 TIA (transient ischemic attack) 12/26/2000    NO RESIDUAL PROBLEM  . Osteoporosis   . Hyperparathyroidism, primary    Past Surgical History  Procedure Laterality Date  . Partial hysterectomy  1991  . Abdominal hysterectomy    . Breast lumpectomy  1999    left  . Parathyroidectomy N/A 05/06/2013    Procedure: PARATHYROIDECTOMY;  Surgeon: Velora Heckler, MD;  Location: [facility];  Service: General;  Laterality: N/A;   Family History  Problem Relation Age of Onset  . Prostate cancer Father   . Heart attack Maternal Grandmother   . Cancer Maternal Grandfather     oral cancer  . Hypertension Mother    History  Substance Use Topics  . Smoking status: Never Smoker   . Smokeless tobacco: Never Used  . Alcohol Use: No   OB History   Grav Para Term Preterm Abortions TAB SAB Ect Mult Living                 Review of  Systems  Constitutional: Negative for fever.  Respiratory: Positive for chest tightness. Negative for shortness of breath.   Cardiovascular: Positive for chest pain and palpitations. Negative for leg swelling.  Gastrointestinal: Negative for nausea, vomiting and abdominal pain.  All other systems reviewed and are negative.    Allergies  Review of patient's allergies indicates no known allergies.  Home Medications   Current Outpatient Rx  Name  Route  Sig  Dispense  Refill  . Ascorbic Acid (VITAMIN C) 1000 MG tablet   Oral   Take 1,000 mg by mouth daily.         Marland Kitchen aspirin 81 MG tablet   Oral   Take 81 mg by mouth daily.         . B Complex-C (B-COMPLEX WITH VITAMIN C) tablet   Oral   Take 1 tablet by mouth daily.         Marland Kitchen BIOTIN 5000 PO   Oral   Take 1 tablet by mouth daily.          . cholecalciferol (VITAMIN D) 1000 UNITS tablet   Oral   Take 2,000 Units by mouth daily.         . vitamin B-12 (CYANOCOBALAMIN) 250 MCG tablet   Oral   Take 2,500 mcg by mouth daily.          Marland Kitchen  vitamin E (VITAMIN E) 400 UNIT capsule   Oral   Take 400 Units by mouth daily.          BP 159/88  Pulse 78  Temp(Src) 97.5 F (36.4 C) (Oral)  Resp 18  SpO2 100% Physical Exam  Nursing note and vitals reviewed. Constitutional: She is oriented to person, place, and time. She appears well-developed and well-nourished.  HENT:  Head: Normocephalic and atraumatic.  Right Ear: External ear normal.  Left Ear: External ear normal.  Nose: Nose normal.  Eyes: Right eye exhibits no discharge. Left eye exhibits no discharge.  Cardiovascular: Normal rate, regular rhythm and normal heart sounds.   Pulmonary/Chest: Effort normal and breath sounds normal. She has no wheezes. She exhibits no tenderness.  Abdominal: Soft. There is no tenderness.  Musculoskeletal: She exhibits no edema and no tenderness.  Neurological: She is alert and oriented to person, place, and time.  Skin: Skin  is warm and dry.    ED Course  Procedures (including critical care time) Labs Review Labs Reviewed  CBC WITH DIFFERENTIAL - Abnormal; Notable for the following:    Monocytes Relative 14 (*)    All other components within normal limits  BASIC METABOLIC PANEL - Abnormal; Notable for the following:    Glucose, Bld 107 (*)    BUN 26 (*)    GFR calc non Af Amer 88 (*)    All other components within normal limits  HEPATIC FUNCTION PANEL - Abnormal; Notable for the following:    AST 38 (*)    Total Bilirubin 0.2 (*)    All other components within normal limits  D-DIMER, QUANTITATIVE  POCT I-STAT TROPONIN I   Imaging Review Dg Chest 2 View  07/08/2013   CLINICAL DATA:  Chest pain  EXAM: CHEST  2 VIEW  COMPARISON:  None.  FINDINGS: Cardiomediastinal silhouette is unremarkable. No acute infiltrate or pleural effusion. No pulmonary edema. Bony thorax is unremarkable. Bilateral nodular nipple shadow is noted.  IMPRESSION: No active cardiopulmonary disease.   Electronically Signed   By: Natasha Mead M.D.   On: 07/08/2013 20:26    EKG Interpretation     Ventricular Rate:  69 PR Interval:  154 QRS Duration: 88 QT Interval:  372 QTC Calculation: 398 R Axis:   70 Text Interpretation:  Sinus rhythm Biatrial enlargement Nonspecific ST abnormality            MDM   1. Chest pain    Patient with atypical chest symptoms waxing and waning all day. EKG non-specific, first troponin negative. Low risk for PE, combined with neg ddimer I feel she does not need further w/u. Given that she had sx that woke her up with no prior GERD or other sx in past, I feel she would benefit from hospital observation and serial enzymes with possible stress imaging.    Audree Camel, MD 07/08/13 2245

## 2013-07-08 NOTE — ED Notes (Signed)
Pt present to ED with c/o recurrent centralized chest pain and burning.  Pt denies radiation.  Pt reports "been going on for a couple of days and I saw my doctor and EKG was normal.  I was scheduled for ECHO on 18th but pain got worse at 2am today."  Pt deneis pain at this time.  Pt reports palpitations intermittently.

## 2013-07-08 NOTE — H&P (Signed)
PATIENT DETAILS Name: Adrienne Lopez Age: 62 y.o. Sex: female Date of Birth: [birth year] Admit Date: 07/08/2013 ZOX:[id number] R., MD   CHIEF COMPLAINT:  Chest pain  HPI: Adrienne Lopez is a 62 y.o. female with a Past Medical History of primary hyperparathyroidism status post parathyroidectomy approximately 2 months ago, history of left breast cancer status post lumpectomy, radiation and tamoxifen approximately 12 years ago who presents today with the above noted complaint. Per patient, she has been having intermittent palpitations for the past one month. However  last night, she woke up with left-sided chest pain which she describes as burning sensation. The whole day today while at work she continued to have intermittent burning sensation in her chest. Because the symptoms she presented to the emergency room for further evaluation and treatment. I was asked to admit this patient. Patient denies any nausea vomiting. Denies any abdominal pain. She denies any shortness of breath, diaphoresis. She is now being admitted for further evaluation and treatment.   ALLERGIES:  No Known Allergies  PAST MEDICAL HISTORY: Past Medical History  Diagnosis Date  . 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 TIA (transient ischemic attack) 12/26/2000    NO RESIDUAL PROBLEM  . Osteoporosis   . Hyperparathyroidism, primary     PAST SURGICAL HISTORY: Past Surgical History  Procedure Laterality Date  . Partial hysterectomy  1991  . Abdominal hysterectomy    . Breast lumpectomy  1999    left  . Parathyroidectomy N/A 05/06/2013    Procedure: PARATHYROIDECTOMY;  Surgeon: Velora Heckler, MD;  Location: [facility];  Service: General;  Laterality: N/A;    MEDICATIONS AT HOME: Prior to Admission medications   Medication Sig Start Date End Date Taking? Authorizing Provider  Ascorbic Acid (VITAMIN C) 1000 MG tablet Take 1,000 mg by mouth daily.   Yes Historical Provider, MD  aspirin 81 MG tablet Take 81 mg by mouth daily.    Yes Historical Provider, MD  B Complex-C (B-COMPLEX WITH VITAMIN C) tablet Take 1 tablet by mouth daily.   Yes Historical Provider, MD  BIOTIN 5000 PO Take 1 tablet by mouth daily.    Yes Historical Provider, MD  cholecalciferol (VITAMIN D) 1000 UNITS tablet Take 2,000 Units by mouth daily.   Yes Historical Provider, MD  vitamin B-12 (CYANOCOBALAMIN) 250 MCG tablet Take 2,500 mcg by mouth daily.    Yes Historical Provider, MD  vitamin E (VITAMIN E) 400 UNIT capsule Take 400 Units by mouth daily.   Yes Historical Provider, MD    FAMILY HISTORY: Family History  Problem Relation Age of Onset  . Prostate cancer Father   . Heart attack Maternal Grandmother   . Cancer Maternal Grandfather     oral cancer  . Hypertension Mother     SOCIAL HISTORY:  reports that she has never smoked. She has never used smokeless tobacco. She reports that she does not drink alcohol or use illicit drugs.  REVIEW OF SYSTEMS:  Constitutional:   No  weight loss, night sweats,  Fevers, chills, fatigue.  HEENT:    No headaches, Difficulty swallowing,Tooth/dental problems,Sore throat,  No sneezing, itching, ear ache, nasal congestion, post nasal drip,   Cardio-vascular: No  Orthopnea, PND, swelling in lower extremities, anasarca, dizziness, palpitations  GI:  No abdominal pain, nausea, vomiting, diarrhea, change in bowel habits, loss of appetite  Resp: No shortness of breath with exertion or at rest.  No excess mucus, no productive cough, No non-productive cough,  No coughing up of  blood.No change in color of mucus.No wheezing.No chest wall deformity  Skin:  no rash or lesions.  GU:  no dysuria, change in color of urine, no urgency or frequency.  No flank pain.  Musculoskeletal: No joint pain or swelling.  No decreased range of motion.  No back pain.  Psych: No change in mood or affect. No depression or anxiety.  No memory loss.   PHYSICAL EXAM: Blood pressure 159/88, pulse 78, temperature 97.5  F (36.4 C), temperature source Oral, resp. rate 18, SpO2 100.00%.  General appearance :Awake, alert, not in any distress. Speech Clear. Not toxic Looking HEENT: Atraumatic and Normocephalic, pupils equally reactive to light and accomodation Neck: supple, no JVD. No cervical lymphadenopathy.  Chest:Good air entry bilaterally, no added sounds  CVS: S1 S2 regular, no murmurs.  Abdomen: Bowel sounds present, Non tender and not distended with no gaurding, rigidity or rebound. Extremities: B/L Lower Ext shows no edema, both legs are warm to touch Neurology: Awake alert, and oriented X 3, CN II-XII intact, Non focal Skin:No Rash Wounds:N/A  LABS ON ADMISSION:   Recent Labs  07/08/13 2015  NA 135  K 4.4  CL 101  CO2 24  GLUCOSE 107*  BUN 26*  CREATININE 0.74  CALCIUM 9.8    Recent Labs  07/08/13 2015  AST 38*  ALT 28  ALKPHOS 69  BILITOT 0.2*  PROT 8.0  ALBUMIN 3.6   No results found for this basename: LIPASE, AMYLASE,  in the last 72 hours  Recent Labs  07/08/13 2015  WBC 7.0  NEUTROABS 3.7  HGB 13.5  HCT 41.2  MCV 87.1  PLT 163   No results found for this basename: CKTOTAL, CKMB, CKMBINDEX, TROPONINI,  in the last 72 hours  Recent Labs  07/08/13 2015  DDIMER <0.27   No components found with this basename: POCBNP,    RADIOLOGIC STUDIES ON ADMISSION: Dg Chest 2 View  07/08/2013   CLINICAL DATA:  Chest pain  EXAM: CHEST  2 VIEW  COMPARISON:  None.  FINDINGS: Cardiomediastinal silhouette is unremarkable. No acute infiltrate or pleural effusion. No pulmonary edema. Bony thorax is unremarkable. Bilateral nodular nipple shadow is noted.  IMPRESSION: No active cardiopulmonary disease.   Electronically Signed   By: Natasha Mead M.D.   On: 07/08/2013 20:26     EKG: Independently reviewed. Normal sinus rhythm  ASSESSMENT AND PLAN: Present on Admission:  . Chest pain  - Highly atypical, suspect this is gastroesophageal reflux disease. Cardiac enzymes and d-dimer  is so far negative.  -She'll be admitted as overnight observation, cardiac enzymes will be cycled, she'll be monitored on telemetry.She will be placed on aspirin, PPI and as needed antacids.If she rules out, I suspect she can be discharged with planned outpatient stress test. However for now, we will see how her clinical progress as.   . Hyperparathyroidism, primary-status post parathyroidectomy - Calcium levels are currently normal  - Patient is monitored and followed in the outpatient setting by her endocrinologist-Dr. Talmage Nap  . Palpitations - Patient has a long-standing history of palpitations for the past one month. This is intermittent. - She will be monitored on telemetry. Echocardiogram will be ordered.  Marland Kitchen History of breast cancer - Status post left lumpectomy, radiation and tamoxifen approximately 12 years ago  Further plan will depend as patient's clinical course evolves and further radiologic and laboratory data become available. Patient will be monitored closely.   DVT Prophylaxis: Prophylactic Lovenox   Code Status: Full Code  Total time spent for admission equals 45 minutes.  [facility] Pager [phone]  If 7PM-7AM, please contact night-coverage [email or web address] Password [facility] 07/08/2013, 10:57 PM

## 2013-07-09 ENCOUNTER — Encounter (HOSPITAL_COMMUNITY): Payer: Self-pay | Admitting: *Deleted

## 2013-07-09 DIAGNOSIS — R0609 Other forms of dyspnea: Secondary | ICD-10-CM

## 2013-07-09 LAB — CBC
MCH: 29.8 pg (ref 26.0–34.0)
MCHC: 34.7 g/dL (ref 30.0–36.0)
MCV: 86 fL (ref 78.0–100.0)
Platelets: 178 10*3/uL (ref 150–400)
RBC: 4.56 MIL/uL (ref 3.87–5.11)
RDW: 14.2 % (ref 11.5–15.5)
WBC: 5.8 10*3/uL (ref 4.0–10.5)

## 2013-07-09 LAB — BASIC METABOLIC PANEL
CO2: 29 mEq/L (ref 19–32)
Calcium: 9.8 mg/dL (ref 8.4–10.5)
Chloride: 103 mEq/L (ref 96–112)
Creatinine, Ser: 0.88 mg/dL (ref 0.50–1.10)
GFR calc non Af Amer: 68 mL/min — ABNORMAL LOW (ref >90)
Sodium: 139 mEq/L (ref 135–145)

## 2013-07-09 LAB — TROPONIN I: Troponin I: <0.3 ng/mL (ref <0.30)

## 2013-07-09 NOTE — Discharge Summary (Signed)
Physician Discharge Summary  Adrienne Lopez ZOX:[id number] DOB: 1951-01-24 DOA: 07/08/2013  PCP: Adrienne Lopez., MD  Admit date: 07/08/2013 Discharge date: 07/09/2013  Recommendations for Outpatient Follow-up:  1. Pt will need to follow up with PCP in 2-3 weeks post discharge 2. Please obtain BMP to evaluate electrolytes and kidney function 3. Please also check CBC to evaluate Hg and Hct levels 4. Pt advised to follow up with her cardiologist as scheduled  Discharge Diagnoses: Chest pain Principal Problem:   Chest pain Active Problems:   Hyperparathyroidism, primary   Palpitations  Discharge Condition: Stable  Diet recommendation: Heart healthy diet discussed in details   History of present illness:  62 y.o. female with a Past Medical History of primary hyperparathyroidism status post parathyroidectomy approximately 2 months ago, history of left breast cancer status post lumpectomy, radiation and tamoxifen approximately 12 years ago who presented with sudden onset chest pain that woke her up form sleep, sharp and lasting 10 minutes, in the mid sternal area and associated with palpitations, no fevers, chills, no shortness of breath. Pt denies similar event sin the past and denies any specific radiating symptoms. She denies nausea or vomiting, no specific abdominal or urinary concerns.   Hospital Course:  Principal Problem:   Chest pain - ACS ruled out, no event son telemetry and troponin negative - now resolved and chest pain free this AM, pt wants to go home today   - possibly musculoskeletal in etiology - pt advised to follow up with cardiologist as scheduled  Active Problems:   Hyperparathyroidism, primary - follows with surgery    Palpitations - possibly related to principal problem - now resolved, no events on telemetry   Procedures/Studies: Dg Chest 2 View   07/08/2013   No active cardiopulmonary disease.     Consultations:  None  Antibiotics:  None  Discharge  Exam: Filed Vitals:   07/09/13 0546  BP: 142/85  Pulse: 66  Temp: 97.7 F (36.5 C)  Resp: 14   Filed Vitals:   07/08/13 1904 07/08/13 2342 07/09/13 0546  BP: 159/88 160/88 142/85  Pulse: 78 71 66  Temp: 97.5 F (36.4 C) 98.2 F (36.8 C) 97.7 F (36.5 C)  TempSrc: Oral Oral Oral  Resp: 18 16 14   Height:  5' 4.5" (1.638 m)   Weight:  65.7 kg (144 lb 13.5 oz)   SpO2: 100% 100% 100%    General: Pt is alert, follows commands appropriately, not in acute distress Cardiovascular: Regular rate and rhythm, S1/S2 +, no murmurs, no rubs, no gallops Respiratory: Clear to auscultation bilaterally, no wheezing, no crackles, no rhonchi Abdominal: Soft, non tender, non distended, bowel sounds +, no guarding Extremities: no edema, no cyanosis, pulses palpable bilaterally DP and PT Neuro: Grossly nonfocal  Discharge Instructions  Discharge Orders   Future Appointments Provider Department Dept Phone   07/11/2013 3:30 PM Adrienne Heckler, MD [facility], [place] [phone]   07/19/2013 9:00 AM [facility] Rm 1 [place] [facility] [address] [phone]   07/25/2013 9:45 AM Chrystie Nose, MD [facility] [phone]   Future Orders Complete By Expires   Diet - low sodium heart healthy  As directed    Increase activity slowly  As directed        Medication List         aspirin 81 MG tablet  Take 81 mg by mouth daily.     B-complex with vitamin C tablet  Take 1 tablet by mouth daily.  BIOTIN 5000 PO  Take 1 tablet by mouth daily.     cholecalciferol 1000 UNITS tablet  Commonly known as:  VITAMIN D  Take 2,000 Units by mouth daily.     vitamin B-12 250 MCG tablet  Commonly known as:  CYANOCOBALAMIN  Take 2,500 mcg by mouth daily.     vitamin C 1000 MG tablet  Take 1,000 mg by mouth daily.     vitamin E 400 UNIT capsule  Generic drug:  vitamin E  Take 400 Units by mouth daily.           Follow-up Information   Follow up  with Adrienne Lopez., MD In 2 weeks.   Specialty:  Internal Medicine   Contact information:   [address] [place] [address] [phone]       Follow up with Chrystie Nose, MD. (as scheduled )    Specialty:  Cardiology   Contact information:   [address] [place] [address] [place] [address] [phone]       Follow up with Adrienne Presto, MD. (As needed if symptoms worsen call my cell phone [phone])    Specialty:  Internal Medicine   Contact information:   [address] [place] [address] [phone]        The results of significant diagnostics from this hospitalization (including imaging, microbiology, ancillary and laboratory) are listed below for reference.     Microbiology: No results found for this or any previous visit (from the past 240 hour(s)).   Labs: Basic Metabolic Panel:  Recent Labs Lab 07/05/13 0805 07/08/13 2015 07/09/13 0325  NA  --  135 139  K  --  4.4 3.7  CL  --  101 103  CO2  --  24 29  GLUCOSE  --  107* 84  BUN  --  26* 22  CREATININE  --  0.74 0.88  CALCIUM 9.2 9.8 9.8   Liver Function Tests:  Recent Labs Lab 07/08/13 2015  AST 38*  ALT 28  ALKPHOS 69  BILITOT 0.2*  PROT 8.0  ALBUMIN 3.6   CBC:  Recent Labs Lab 07/08/13 2015 07/09/13 0325  WBC 7.0 5.8  NEUTROABS 3.7  --   HGB 13.5 13.6  HCT 41.2 39.2  MCV 87.1 86.0  PLT 163 178   Cardiac Enzymes:  Recent Labs Lab 07/09/13 0325  TROPONINI <0.30   SIGNED: Time coordinating discharge: Over 30 minutes  Adrienne Presto, MD  [facility] 07/09/2013, 7:46 AM Pager [phone]  If 7PM-7AM, please contact night-coverage [email or web address] Password [email or web address]

## 2013-07-11 ENCOUNTER — Encounter (INDEPENDENT_AMBULATORY_CARE_PROVIDER_SITE_OTHER): Payer: Self-pay | Admitting: Surgery

## 2013-07-11 ENCOUNTER — Ambulatory Visit (INDEPENDENT_AMBULATORY_CARE_PROVIDER_SITE_OTHER): Payer: 59 | Admitting: Surgery

## 2013-07-11 VITALS — BP 128/64 | HR 76 | Resp 16 | Ht 64.5 in | Wt 146.8 lb

## 2013-07-11 DIAGNOSIS — E210 Primary hyperparathyroidism: Secondary | ICD-10-CM

## 2013-07-11 NOTE — Progress Notes (Signed)
General Surgery [facility]  Chief Complaint  Patient presents with  . Routine Post Op    parathyroidectomy 05/06/2013    HISTORY: Patient is a 62 year old female who underwent minimally invasive parathyroidectomy on 05/06/2013. Laboratory studies are now normal with a calcium level of 9.2 and a near normal intact PTH level of 82.  EXAM: Surgical incision is well-healed with a good cosmetic result. No sign of infection. Voice quality is normal.  IMPRESSION: Status post minimally invasive parathyroidectomy  PLAN: I have advised the patient to continue taking a daily vitamin D supplement. I suspect that her intact PTH level will continue to return to the normal range over the next several months. She may also resume daily calcium supplements for bone health.  Patient will return for surgical care as needed.  Velora Heckler, MD, FACS General & Endocrine Surgery [facility]   Visit Diagnoses: 1. Hyperparathyroidism, primary

## 2013-07-11 NOTE — Patient Instructions (Signed)
  COCOA BUTTER & VITAMIN E CREAM  ([employer] or other brand)  Apply cocoa butter/vitamin E cream to your incision 2 - 3 times daily.  Massage cream into incision for one minute with each application.  Use sunscreen (50 SPF or higher) for first 6 months after surgery if area is exposed to sun.  You may substitute Mederma or other scar reducing creams as desired.   

## 2013-07-12 ENCOUNTER — Other Ambulatory Visit: Payer: Self-pay | Admitting: *Deleted

## 2013-07-12 DIAGNOSIS — R002 Palpitations: Secondary | ICD-10-CM

## 2013-07-19 ENCOUNTER — Ambulatory Visit (HOSPITAL_COMMUNITY)
Admission: RE | Admit: 2013-07-19 | Discharge: 2013-07-19 | Disposition: A | Discharge status: Home / Self Care | Payer: 59 | Source: Ambulatory Visit | Attending: Internal Medicine | Admitting: Internal Medicine

## 2013-07-19 DIAGNOSIS — Z853 Personal history of malignant neoplasm of breast: Secondary | ICD-10-CM | POA: Insufficient documentation

## 2013-07-19 DIAGNOSIS — R0989 Other specified symptoms and signs involving the circulatory and respiratory systems: Secondary | ICD-10-CM | POA: Insufficient documentation

## 2013-07-19 DIAGNOSIS — R002 Palpitations: Secondary | ICD-10-CM

## 2013-07-19 DIAGNOSIS — R0609 Other forms of dyspnea: Secondary | ICD-10-CM | POA: Insufficient documentation

## 2013-07-19 NOTE — Progress Notes (Signed)
2D Echo Performed 07/19/2013    Clearence Ped, RCS

## 2013-07-25 ENCOUNTER — Ambulatory Visit (INDEPENDENT_AMBULATORY_CARE_PROVIDER_SITE_OTHER): Payer: 59 | Admitting: Internal Medicine

## 2013-07-25 ENCOUNTER — Encounter: Payer: Self-pay | Admitting: Internal Medicine

## 2013-07-25 VITALS — BP 120/76 | HR 60 | Ht 64.5 in | Wt 149.3 lb

## 2013-07-25 DIAGNOSIS — I4949 Other premature depolarization: Secondary | ICD-10-CM

## 2013-07-25 DIAGNOSIS — R079 Chest pain, unspecified: Secondary | ICD-10-CM

## 2013-07-25 DIAGNOSIS — I491 Atrial premature depolarization: Secondary | ICD-10-CM | POA: Insufficient documentation

## 2013-07-25 DIAGNOSIS — R9431 Abnormal electrocardiogram [ECG] [EKG]: Secondary | ICD-10-CM

## 2013-07-25 DIAGNOSIS — R002 Palpitations: Secondary | ICD-10-CM

## 2013-07-25 DIAGNOSIS — I493 Ventricular premature depolarization: Secondary | ICD-10-CM | POA: Insufficient documentation

## 2013-07-25 MED ORDER — ATENOLOL 25 MG PO TABS: 25.0000 mg | ORAL_TABLET | Freq: Every day | ORAL | Status: DC

## 2013-07-25 NOTE — Progress Notes (Signed)
OFFICE NOTE  Chief Complaint:  Palpitations, mild dyspnea  Primary Care Physician: Alva Garnet., MD  HPI:  Adrienne Lopez is a pleasant 62 year old female from [place]. She was noted to be hypercalcemic and ultimately diagnosed with hyperparathyroidism. She underwent parathyroid surgery by Dr. Gerrit Friends in September. She did well since then however subsequently developed palpitations. She describes the palpitations as a hard thumping in her chest and neck were occurring several times a day after surgery. The symptoms have improved somewhat and it seems to be coincidental with her taking aspirin.  She does have a history of TIA in the past, but the episode was unclear as she "blacked out" when she was taking medication for breast cancer, specifically tamoxifen. She was taken off of that at that time. She did have a history of breast cancer and underwent chest wall radiation in 1999 but has been free of recurrence. Otherwise she has no other medical history and is currently not on any medications other than aspirin. He recently had blood work through Dr. Theora Gianotti office which indicates her thyroid function is low normal but her parathyroid is within normal limits and her calcium is now 9.3. She reports her palpitations occur a couple times a day and feel like a strong beating of the heart that causes her some mild shortness of breath. She denies any chest pain or anginal symptoms. There is no significant family history of heart disease.   At her last visit I recommended an echocardiogram and 48 hour monitor. She did wear the monitor for 48 hours which reported actually a number of both ventricular and atrial ectopic beats. Infectious thousand and 26 bigeminal ventricular beats. There was no significant nonsustained VT but there was some sustained supraventricular tachycardia.  She underwent an echocardiogram which was essentially normal except for some mitral regurgitation, but no wall motion  abnormalities and preserved EF of 55-60%.  Subsequent to this, she had an episode of chest pain which awoke her up at night on November 7. She went to the emergency department with symptoms of chest tightness and left arm pain. The symptoms resolved fairly quickly and she ruled out for MI. She did report some palpitations associated with this event.  In addition to her parathyroid problems, there is a question about thyroid abnormality as well. She is currently undergoing testing for this. She says all of these symptoms have come on since her surgery for the parathyroid glands.  PMHx:  Past Medical History  Diagnosis Date  . 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 TIA (transient ischemic attack) 12/26/2000    NO RESIDUAL PROBLEM  . Osteoporosis   . Hyperparathyroidism, primary     Past Surgical History  Procedure Laterality Date  . Partial hysterectomy  1991  . Abdominal hysterectomy    . Breast lumpectomy  1999    left  . Parathyroidectomy N/A 05/06/2013    Procedure: PARATHYROIDECTOMY;  Surgeon: Velora Heckler, MD;  Location: [facility];  Service: General;  Laterality: N/A;    FAMHx:  Family History  Problem Relation Age of Onset  . Prostate cancer Father   . Heart attack Maternal Grandmother   . Cancer Maternal Grandfather     oral cancer  . Hypertension Mother     SOCHx:   reports that she has never smoked. She has never used smokeless tobacco. She reports that she does not drink alcohol or use illicit drugs.  ALLERGIES:  No Known Allergies  ROS: A comprehensive  review of systems was negative except for: Constitutional: positive for fatigue Cardiovascular: positive for chest pain, dyspnea and palpitations  HOME MEDS: Current Outpatient Prescriptions  Medication Sig Dispense Refill  . Ascorbic Acid (VITAMIN C) 1000 MG tablet Take 1,000 mg by mouth daily.      Marland Kitchen aspirin 81 MG tablet Take 81 mg by mouth daily.      Marland Kitchen atenolol (TENORMIN) 25 MG tablet Take 1 tablet (25 mg total) by  mouth daily.  30 tablet  6  . B Complex-C (B-COMPLEX WITH VITAMIN C) tablet Take 1 tablet by mouth daily.      Marland Kitchen BIOTIN 5000 PO Take 1 tablet by mouth daily.       . cholecalciferol (VITAMIN D) 1000 UNITS tablet Take 2,000 Units by mouth daily.      . vitamin B-12 (CYANOCOBALAMIN) 250 MCG tablet Take 2,500 mcg by mouth daily.       . vitamin E (VITAMIN E) 400 UNIT capsule Take 400 Units by mouth daily.       No current facility-administered medications for this visit.    LABS/IMAGING: No results found for this or any previous visit (from the past 48 hour(s)). No results found.  VITALS: BP 120/76  Pulse 60  Ht 5' 4.5" (1.638 m)  Wt 149 lb 4.8 oz (67.722 kg)  BMI 25.24 kg/m2  EXAM: General appearance: alert and no distress Neck: no carotid bruit and no JVD Lungs: clear to auscultation bilaterally Heart: regular rate and rhythm, S1, S2 normal, no murmur, click, rub or gallop Abdomen: soft, non-tender; bowel sounds normal; no masses,  no organomegaly Extremities: extremities normal, atraumatic, no cyanosis or edema Pulses: 2+ and symmetric Skin: Skin color, texture, turgor normal. No rashes or lesions Neurologic: Grossly normal Psych: Mood, affect normal  EKG: deferred  ASSESSMENT: 1. Chest pain 2. Continued palpitations - PAC's and PVC;s with bigeminy 3. Mild dyspnea associated with palpitations 4. Recent hyperparathyroidism status post surgery 5. History of TIA 6. History of Breast CA s/p chemotherapy and chest wall radiation  PLAN: 1.   [name] continues to describe palpitations which are indeed PVCs and PACs, with bigeminy and some runs. She recently had chest pain as well which could be related to palpitations however is a little concerning for ischemia and the fact that it had awakened her at night. Is also unusual that these palpitations her palpitations started all of a sudden since her surgery. I do believe she will need an ischemia evaluation to rule out any  significant coronary disease. Given her abnormal EKG, Holter monitor findings and chest pain, or LexiScan nuclear stress test is indicated. I do not feel that she will be able to exercise, as she does virtually no exercise and has never been on the treadmill before.  In addition I recommend starting low-dose atenolol 25 mg daily. This should be continued on the day of stress testing.  Plan to see her back in 2-3 weeks to review results of her stress test and to see if her palpitations have improved.  Thank you again for the kind referral.  Chrystie Nose, MD, [facility] Attending Cardiologist [facility]  Jonathandavid Marlett C 07/25/2013, 11:37 AM

## 2013-07-25 NOTE — Patient Instructions (Signed)
Your physician has recommended you make the following change in your medication: START atenolol 25mg  once daily.  Your physician has requested that you have a lexiscan myoview. For further information please visit [email or web address] Please follow instruction sheet, as given.  Your physician recommends that you schedule a follow-up appointment after your stress test.

## 2013-08-04 ENCOUNTER — Ambulatory Visit (HOSPITAL_COMMUNITY)
Admission: RE | Admit: 2013-08-04 | Discharge: 2013-08-04 | Disposition: A | Discharge status: Home / Self Care | Payer: 59 | Source: Ambulatory Visit | Attending: Cardiovascular Disease | Admitting: Cardiovascular Disease

## 2013-08-04 DIAGNOSIS — R079 Chest pain, unspecified: Secondary | ICD-10-CM | POA: Insufficient documentation

## 2013-08-04 DIAGNOSIS — R0609 Other forms of dyspnea: Secondary | ICD-10-CM | POA: Insufficient documentation

## 2013-08-04 DIAGNOSIS — R9431 Abnormal electrocardiogram [ECG] [EKG]: Secondary | ICD-10-CM

## 2013-08-04 DIAGNOSIS — I491 Atrial premature depolarization: Secondary | ICD-10-CM

## 2013-08-04 DIAGNOSIS — R5381 Other malaise: Secondary | ICD-10-CM | POA: Insufficient documentation

## 2013-08-04 DIAGNOSIS — I4949 Other premature depolarization: Secondary | ICD-10-CM

## 2013-08-04 DIAGNOSIS — R002 Palpitations: Secondary | ICD-10-CM | POA: Insufficient documentation

## 2013-08-04 DIAGNOSIS — I059 Rheumatic mitral valve disease, unspecified: Secondary | ICD-10-CM | POA: Insufficient documentation

## 2013-08-04 DIAGNOSIS — I493 Ventricular premature depolarization: Secondary | ICD-10-CM

## 2013-08-04 DIAGNOSIS — R0989 Other specified symptoms and signs involving the circulatory and respiratory systems: Secondary | ICD-10-CM | POA: Insufficient documentation

## 2013-08-04 MED ORDER — TECHNETIUM TC 99M SESTAMIBI GENERIC - CARDIOLITE
10.8000 | Freq: Once | INTRAVENOUS | Status: AC | PRN
  Administered 2013-08-04: 11 via INTRAVENOUS

## 2013-08-04 MED ORDER — REGADENOSON 0.4 MG/5ML IV SOLN
0.4000 mg | Freq: Once | INTRAVENOUS | Status: AC
  Administered 2013-08-04: 0.4 mg via INTRAVENOUS

## 2013-08-04 MED ORDER — AMINOPHYLLINE 25 MG/ML IV SOLN
75.0000 mg | Freq: Once | INTRAVENOUS | Status: AC
  Administered 2013-08-04: 75 mg via INTRAVENOUS

## 2013-08-04 MED ORDER — TECHNETIUM TC 99M SESTAMIBI GENERIC - CARDIOLITE
31.2000 | Freq: Once | INTRAVENOUS | Status: AC | PRN
  Administered 2013-08-04: 31.2 via INTRAVENOUS

## 2013-08-04 NOTE — Procedures (Addendum)
 [place] [facility] [address] [place] [address] [place] [address] [phone]  Cardiology Nuclear Med Study  Pinkey Mcjunkin is a 62 y.o. female     MRN : [id number]     DOB: Apr 09, 1951  Procedure Date: 08/04/2013  Nuclear Med Background Indication for Stress Test:  Evaluation for Ischemia and Post Hospital History:  MVR Cardiac Risk Factors: TIA  Symptoms:  Chest Pain, DOE, Fatigue, Palpitations and SOB   Nuclear Pre-Procedure Caffeine/Decaff Intake:  9:00pm NPO After: 7:00am   IV Site: R Hand  IV 0.9% NS with Angio Cath:  24g  Chest Size (in):  n/a IV Started by: Emmit Pomfret, RN  Height: 5' 4.5" (1.638 m)  Cup Size: B  BMI:  Body mass index is 25.19 kg/(m^2). Weight:  149 lb (67.586 kg)   Tech Comments:  n/a    Nuclear Med Study 1 or 2 day study: 1 day  Stress Test Type:  Lexiscan  Order Authorizing Provider:  Zoila Shutter, MD   Resting Radionuclide: Technetium 23m Sestamibi  Resting Radionuclide Dose: 10.8 mCi   Stress Radionuclide:  Technetium 17m Sestamibi  Stress Radionuclide Dose: 31.2 mCi           Stress Protocol Rest HR: 57 Stress HR: 86  Rest BP: 144/83 Stress BP: 162/96  Exercise Time (min): n/a METS: n/a          Dose of Adenosine (mg):  n/a Dose of Lexiscan: 0.4 mg  Dose of Atropine (mg): n/a Dose of Dobutamine: n/a mcg/kg/min (at max HR)  Stress Test Technologist: Ernestene Mention, CCT Nuclear Technologist: Gonzella Lex, CNMT   Rest Procedure:  Myocardial perfusion imaging was performed at rest 45 minutes following the intravenous administration of Technetium 61m Sestamibi. Stress Procedure:  The patient received IV Lexiscan 0.4 mg over 15-seconds.  Technetium 99m Sestamibi injected at 30-seconds.  There were no significant changes with Lexiscan.  Quantitative spect images were obtained after a 45 minute delay.  Transient Ischemic Dilatation (Normal <1.22):  1.07 Lung/Heart Ratio (Normal <0.45):  0.28 QGS EDV:  53  ml QGS ESV:  14 ml LV Ejection Fraction: 74%  Signed by       Rest ECG: NSR - Normal EKG  Stress ECG: No significant change from baseline ECG and There are scattered PVCs.  QPS Raw Data Images:  Normal; no motion artifact; normal heart/lung ratio. Stress Images:  Normal homogeneous uptake in all areas of the myocardium. Rest Images:  Normal homogeneous uptake in all areas of the myocardium. Subtraction (SDS):  No evidence of ischemia.  Impression Exercise Capacity:  Lexiscan with no exercise. BP Response:  Normal blood pressure response. Clinical Symptoms:  No significant symptoms noted. ECG Impression:  No significant ST segment change suggestive of ischemia. Comparison with Prior Nuclear Study: No previous nuclear study performed  Overall Impression:  Normal stress nuclear study.  LV Wall Motion:  NL LV Function; NL Wall Motion   Runell Gess, MD  08/04/2013 1:36 PM

## 2013-08-11 ENCOUNTER — Ambulatory Visit (INDEPENDENT_AMBULATORY_CARE_PROVIDER_SITE_OTHER): Payer: 59 | Admitting: Internal Medicine

## 2013-08-11 ENCOUNTER — Encounter: Payer: Self-pay | Admitting: Internal Medicine

## 2013-08-11 VITALS — BP 120/62 | HR 60 | Ht 64.5 in | Wt 149.0 lb

## 2013-08-11 DIAGNOSIS — I491 Atrial premature depolarization: Secondary | ICD-10-CM

## 2013-08-11 DIAGNOSIS — R002 Palpitations: Secondary | ICD-10-CM

## 2013-08-11 DIAGNOSIS — I493 Ventricular premature depolarization: Secondary | ICD-10-CM

## 2013-08-11 DIAGNOSIS — I4949 Other premature depolarization: Secondary | ICD-10-CM

## 2013-08-11 DIAGNOSIS — R079 Chest pain, unspecified: Secondary | ICD-10-CM

## 2013-08-11 MED ORDER — ATENOLOL 50 MG PO TABS: 50.0000 mg | ORAL_TABLET | Freq: Every day | ORAL | Status: DC

## 2013-08-11 NOTE — Progress Notes (Signed)
OFFICE NOTE  Chief Complaint:  Palpitations, mild dyspnea  Primary Care Physician: Alva Garnet., MD  HPI:  Adrienne Lopez is a pleasant 62 year old female from [place]. She was noted to be hypercalcemic and ultimately diagnosed with hyperparathyroidism. She underwent parathyroid surgery by Dr. Gerrit Friends in September. She did well since then however subsequently developed palpitations. She describes the palpitations as a hard thumping in her chest and neck were occurring several times a day after surgery. The symptoms have improved somewhat and it seems to be coincidental with her taking aspirin.  She does have a history of TIA in the past, but the episode was unclear as she "blacked out" when she was taking medication for breast cancer, specifically tamoxifen. She was taken off of that at that time. She did have a history of breast cancer and underwent chest wall radiation in 1999 but has been free of recurrence. Otherwise she has no other medical history and is currently not on any medications other than aspirin. He recently had blood work through Dr. Theora Gianotti office which indicates her thyroid function is low normal but her parathyroid is within normal limits and her calcium is now 9.3. She reports her palpitations occur a couple times a day and feel like a strong beating of the heart that causes her some mild shortness of breath. She denies any chest pain or anginal symptoms. There is no significant family history of heart disease.   At her last visit I recommended an echocardiogram and 48 hour monitor. She did wear the monitor for 48 hours which reported actually a number of both ventricular and atrial ectopic beats. Infectious thousand and 26 bigeminal ventricular beats. There was no significant nonsustained VT but there was some sustained supraventricular tachycardia.  She underwent an echocardiogram which was essentially normal except for some mitral regurgitation, but no wall motion  abnormalities and preserved EF of 55-60%.  Subsequent to this, she had an episode of chest pain which awoke her up at night on November 7. She went to the emergency department with symptoms of chest tightness and left arm pain. The symptoms resolved fairly quickly and she ruled out for MI. She did report some palpitations associated with this event.  In addition to her parathyroid problems, there is a question about thyroid abnormality as well. She is currently undergoing testing for this. She says all of these symptoms have come on since her surgery for the parathyroid glands.  Adrienne Lopez returns today and notes that she's had some improvement in her palpitations although they are still present. She reports they're not nearly as forceful as they had been. She did undergo nuclear stress testing which was negative for ischemia with a preserved EF. She also had an echocardiogram in the hospital which also showed normal systolic function. She denies any further chest pain.  PMHx:  Past Medical History  Diagnosis Date  . 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 TIA (transient ischemic attack) 12/26/2000    NO RESIDUAL PROBLEM  . Osteoporosis   . Hyperparathyroidism, primary     Past Surgical History  Procedure Laterality Date  . Partial hysterectomy  1991  . Abdominal hysterectomy    . Breast lumpectomy  1999    left  . Parathyroidectomy N/A 05/06/2013    Procedure: PARATHYROIDECTOMY;  Surgeon: Velora Heckler, MD;  Location: [facility];  Service: General;  Laterality: N/A;    FAMHx:  Family History  Problem Relation Age of Onset  .  Prostate cancer Father   . Heart attack Maternal Grandmother   . Cancer Maternal Grandfather     oral cancer  . Hypertension Mother     SOCHx:   reports that she has never smoked. She has never used smokeless tobacco. She reports that she does not drink alcohol or use illicit drugs.  ALLERGIES:  No Known Allergies  ROS: A comprehensive review of systems was negative  except for: Constitutional: positive for fatigue Cardiovascular: positive for chest pain, dyspnea and palpitations  HOME MEDS: Current Outpatient Prescriptions  Medication Sig Dispense Refill  . Ascorbic Acid (VITAMIN C) 1000 MG tablet Take 1,000 mg by mouth daily.      Marland Kitchen aspirin 81 MG tablet Take 81 mg by mouth daily.      Marland Kitchen atenolol (TENORMIN) 50 MG tablet Take 1 tablet (50 mg total) by mouth daily.  30 tablet  6  . B Complex-C (B-COMPLEX WITH VITAMIN C) tablet Take 1 tablet by mouth daily.      Marland Kitchen BIOTIN 5000 PO Take 1 tablet by mouth daily.       . cholecalciferol (VITAMIN D) 1000 UNITS tablet Take 2,000 Units by mouth daily.      . vitamin B-12 (CYANOCOBALAMIN) 250 MCG tablet Take 2,500 mcg by mouth daily.       . vitamin E (VITAMIN E) 400 UNIT capsule Take 400 Units by mouth daily.       No current facility-administered medications for this visit.    LABS/IMAGING: No results found for this or any previous visit (from the past 48 hour(s)). No results found.  VITALS: BP 120/62  Pulse 60  Ht 5' 4.5" (1.638 m)  Wt 149 lb (67.586 kg)  BMI 25.19 kg/m2  EXAM: deferred  EKG: deferred  ASSESSMENT: 1. Chest pain-resolved, low risk NST on 08/04/2013 2. Continued palpitations - PAC's and PVC;s with bigeminy 3. Recent hyperparathyroidism status post surgery 4. History of TIA 5. History of Breast CA s/p chemotherapy and chest wall radiation  PLAN: 1.   Ms. Chilton Lopez has had some improvement in her palpitations with atenolol, however, they still persist. There's been no noticeable decrease in blood pressure or heart rate with the medicine therefore think she would tolerate an increase to atenolol 50 mg daily. Hopefully this will help to suppress her remaining ectopy. Her stress test was low risk and I conferred that information to her today. Plan is to see her back in 6 months or sooner if she feels necessary.  Thank you again for allowing me to participate in her care.  Chrystie Nose, MD, [facility] Attending Cardiologist [facility]  Levar Fayson C 08/11/2013, 9:13 AM

## 2013-08-11 NOTE — Patient Instructions (Signed)
Your physician has recommended you make the following change in your medication take 50mg  atenolol once daily.  Your physician wants you to follow-up in: 6 months. You will receive a reminder letter in the mail two months in advance. If you don't receive a letter, please call our office to schedule the follow-up appointment.

## 2013-11-06 IMAGING — NM NM PARATHYROID W/ SPECT
1 series · 6 of 6 positions shown · non-contrast
Comparison: None.

CLINICAL DATA: Hypercalcemia.  Possible parathyroid adenoma.

NM PARATHYROID SCINTIGRAPHY AND SPECT IMAGING
TECHNIQUE: Following intravenous administration of
radiopharmaceutical, early and 2-hour delayed planar images were
obtained in the anterior projection.  Delayed triplanar SPECT
images were also obtained at 2 hours.
Radiopharmaceutical:  25 mCi 3c-SSm Sestamibi IV

[Series 1: pa parathyroid · 4.75mm/px · 6 of 128 frames shown]
[frame 11/128]
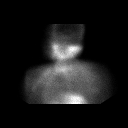
[frame 32/128]
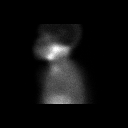
[frame 54/128]
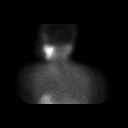
[frame 75/128]
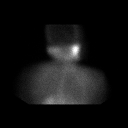
[frame 96/128]
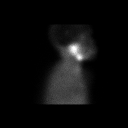
[frame 118/128]
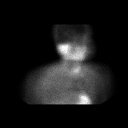

[6 of 6 positions shown; findings below may reference images not displayed]

FINDINGS: Initial planar images demonstrate mildly asymmetric
activity in the lower neck with increased activity in the region of
the left thyroid lobe.  On delayed planar images, most of the
thyroid activity washes out.  There is persistent asymmetric
activity on the left.

On the delayed SPECT imaging, this localizes to the left
paratracheal region.  Although less distinct than typically seen
with a parathyroid adenoma, this is relatively superiorly
positioned and suggests the possibility of a superior left
parathyroid adenoma.  There is no abnormal activity within the
superior mediastinum.
IMPRESSION: Persistent delayed activity in the superior left paratracheal area
suggesting a possible superior left parathyroid adenoma.

## 2013-11-15 ENCOUNTER — Encounter: Payer: Self-pay | Admitting: Cardiology

## 2013-11-15 ENCOUNTER — Ambulatory Visit (INDEPENDENT_AMBULATORY_CARE_PROVIDER_SITE_OTHER): Payer: 59 | Admitting: Cardiology

## 2013-11-15 VITALS — BP 120/60 | HR 55 | Ht 65.0 in | Wt 150.0 lb

## 2013-11-15 DIAGNOSIS — R002 Palpitations: Secondary | ICD-10-CM

## 2013-11-15 DIAGNOSIS — I493 Ventricular premature depolarization: Secondary | ICD-10-CM

## 2013-11-15 DIAGNOSIS — I491 Atrial premature depolarization: Secondary | ICD-10-CM

## 2013-11-15 DIAGNOSIS — I4949 Other premature depolarization: Secondary | ICD-10-CM

## 2013-11-15 DIAGNOSIS — E210 Primary hyperparathyroidism: Secondary | ICD-10-CM

## 2013-11-15 DIAGNOSIS — C50919 Malignant neoplasm of unspecified site of unspecified female breast: Secondary | ICD-10-CM | POA: Insufficient documentation

## 2013-11-15 NOTE — Patient Instructions (Signed)
Return as needed. Call if you wish a referral to EP.

## 2013-11-15 NOTE — Assessment & Plan Note (Signed)
S/P parathyroidectomy Sept 2014

## 2013-11-15 NOTE — Progress Notes (Signed)
11/15/2013 Adrienne Lopez   Feb 08, 1951  [id number]  Primary Physicia Adrienne Lopez., MD Primary Cardiologist: Dr Adrienne Lopez  HPI:  The patient is a pleasant 63 year old female from [place]. She was noted to be hypercalcemic and ultimately diagnosed with hyperparathyroidism and underwent parathyroid surgery by Dr. Harlow Lopez in September. She then subsequently developed palpitations. She describes the palpitations as a hard thumping in her chest and neck, and "hard heart beats".  She did wear a monitor for 48 hours which reported a number of both ventricular and atrial ectopic beats. There was no significant nonsustained VT but there was some sustained supraventricular tachycardia. She underwent an echocardiogram which was essentially normal except for some mitral regurgitation, but no wall motion abnormalities and preserved EF of 55-60%. Myoview was low risk. The symptoms improved with beta blocker therapy but have recurred and she is here for further evaluation. She admits her symptoms are usually at rest and not exertional. She denies any tachycardia.    Current Outpatient Prescriptions  Medication Sig Dispense Refill  . Ascorbic Acid (VITAMIN C) 1000 MG tablet Take 1,000 mg by mouth daily.      Marland Kitchen aspirin 81 MG tablet Take 81 mg by mouth daily.      Marland Kitchen atenolol (TENORMIN) 50 MG tablet Take 1 tablet (50 mg total) by mouth daily.  30 tablet  6  . B Complex-C (B-COMPLEX WITH VITAMIN C) tablet Take 1 tablet by mouth daily.      Marland Kitchen BIOTIN 5000 PO Take 1 tablet by mouth daily.       . cholecalciferol (VITAMIN D) 1000 UNITS tablet Take 1,000 Units by mouth daily. Taking 4000units daily      . vitamin B-12 (CYANOCOBALAMIN) 250 MCG tablet Take 2,500 mcg by mouth daily.       . vitamin E (VITAMIN E) 400 UNIT capsule Take 400 Units by mouth daily.       No current facility-administered medications for this visit.    No Known Allergies  History   Social History  . Marital Status: Divorced    Spouse  Name: N/A    Number of Children: N/A  . Years of Education: N/A   Occupational History  . Not on file.   Social History Main Topics  . Smoking status: Never Smoker   . Smokeless tobacco: Never Used  . Alcohol Use: No  . Drug Use: No  . Sexual Activity: Not on file   Other Topics Concern  . Not on file   Social History Narrative  . No narrative on file     Review of Systems: General: negative for chills, fever, night sweats or weight changes.  Cardiovascular: negative for chest pain, dyspnea on exertion, edema, orthopnea, palpitations, paroxysmal nocturnal dyspnea or shortness of breath Dermatological: negative for rash Respiratory: negative for cough or wheezing Urologic: negative for hematuria Abdominal: negative for nausea, vomiting, diarrhea, bright red blood per rectum, melena, or hematemesis Neurologic: negative for visual changes, syncope, or dizziness All other systems reviewed and are otherwise negative except as noted above.    Blood pressure 120/60, pulse 55, height 5\' 5"  (1.651 m), weight 150 lb (68.04 kg).  General appearance: alert, cooperative and no distress Lungs: clear to auscultation bilaterally Heart: regular rate and rhythm  EKG NSR, SB, one fusion beat  ASSESSMENT AND PLAN:   Palpitations She is here because of recurrent palpitations  PAC (premature atrial contraction) .  PVC (premature ventricular contraction) .  Hyperparathyroidism, primary S/P parathyroidectomy Sept 2014  Breast cancer S/P lumpectomy and radiation 1999   PLAN  I discussed Adrienne Lopez's case with Dr Adrienne Lopez. There is no room to increase her beta blocker. We have reassured her it does not appear to be a dangerous arrythmia. We did offer to refer her to EP for further evaluation but for now she will continue current Rx. "I can live with it as long as I know it's not dangerous".  I gave her a copy of her 48 hr monitor to take with her in case she wants to pursue further  evaluation.  [facility] KPA-C 11/15/2013 8:34 AM

## 2013-11-15 NOTE — Assessment & Plan Note (Signed)
She is here because of recurrent palpitations

## 2013-11-15 NOTE — Assessment & Plan Note (Signed)
S/P lumpectomy and radiation 1999

## 2014-02-28 IMAGING — CR DG CHEST 2V
2 series · 2 of 2 positions shown · non-contrast
Comparison: None.

CLINICAL DATA: Chest pain

EXAM:
CHEST  2 VIEW

[w chest pa]
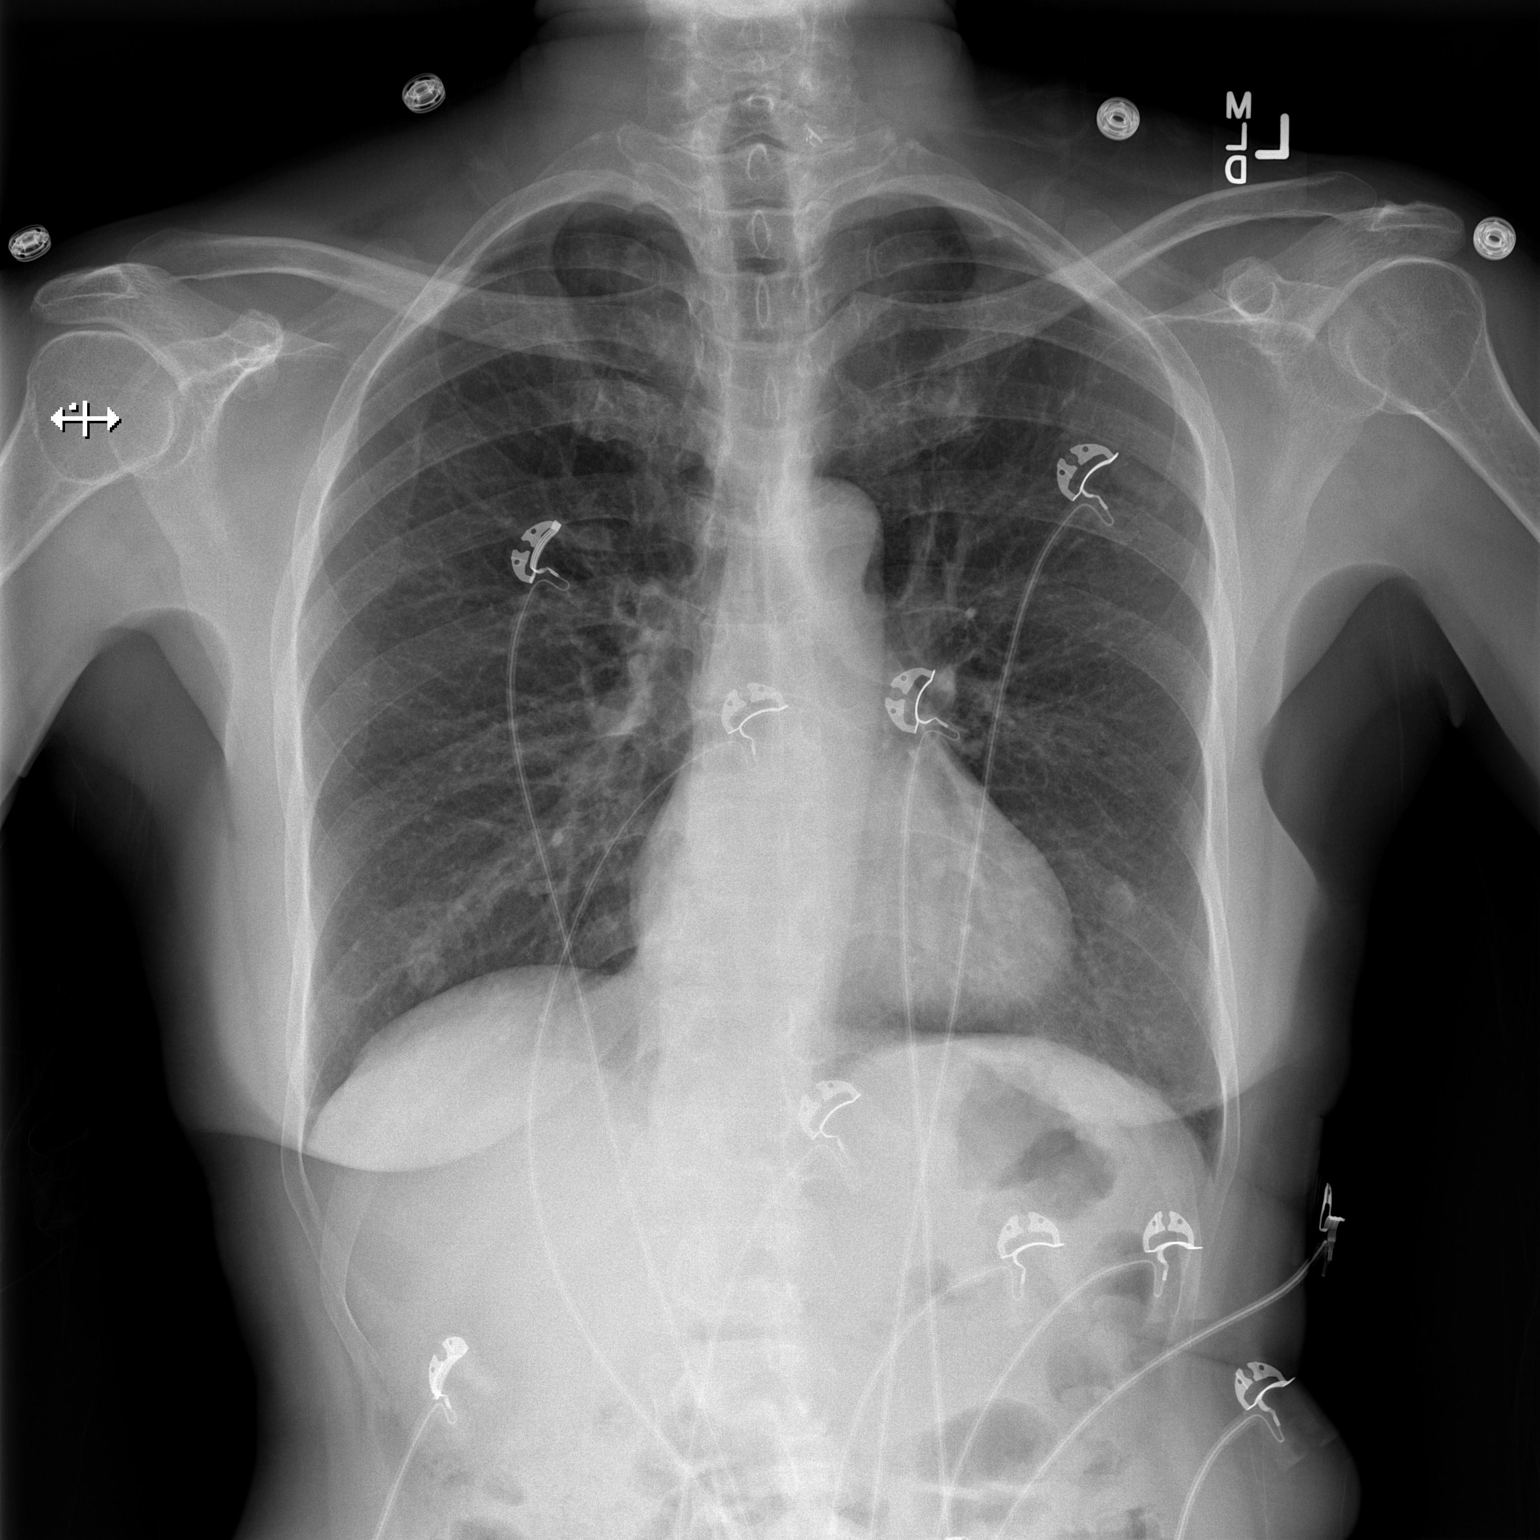

[w chest lat]
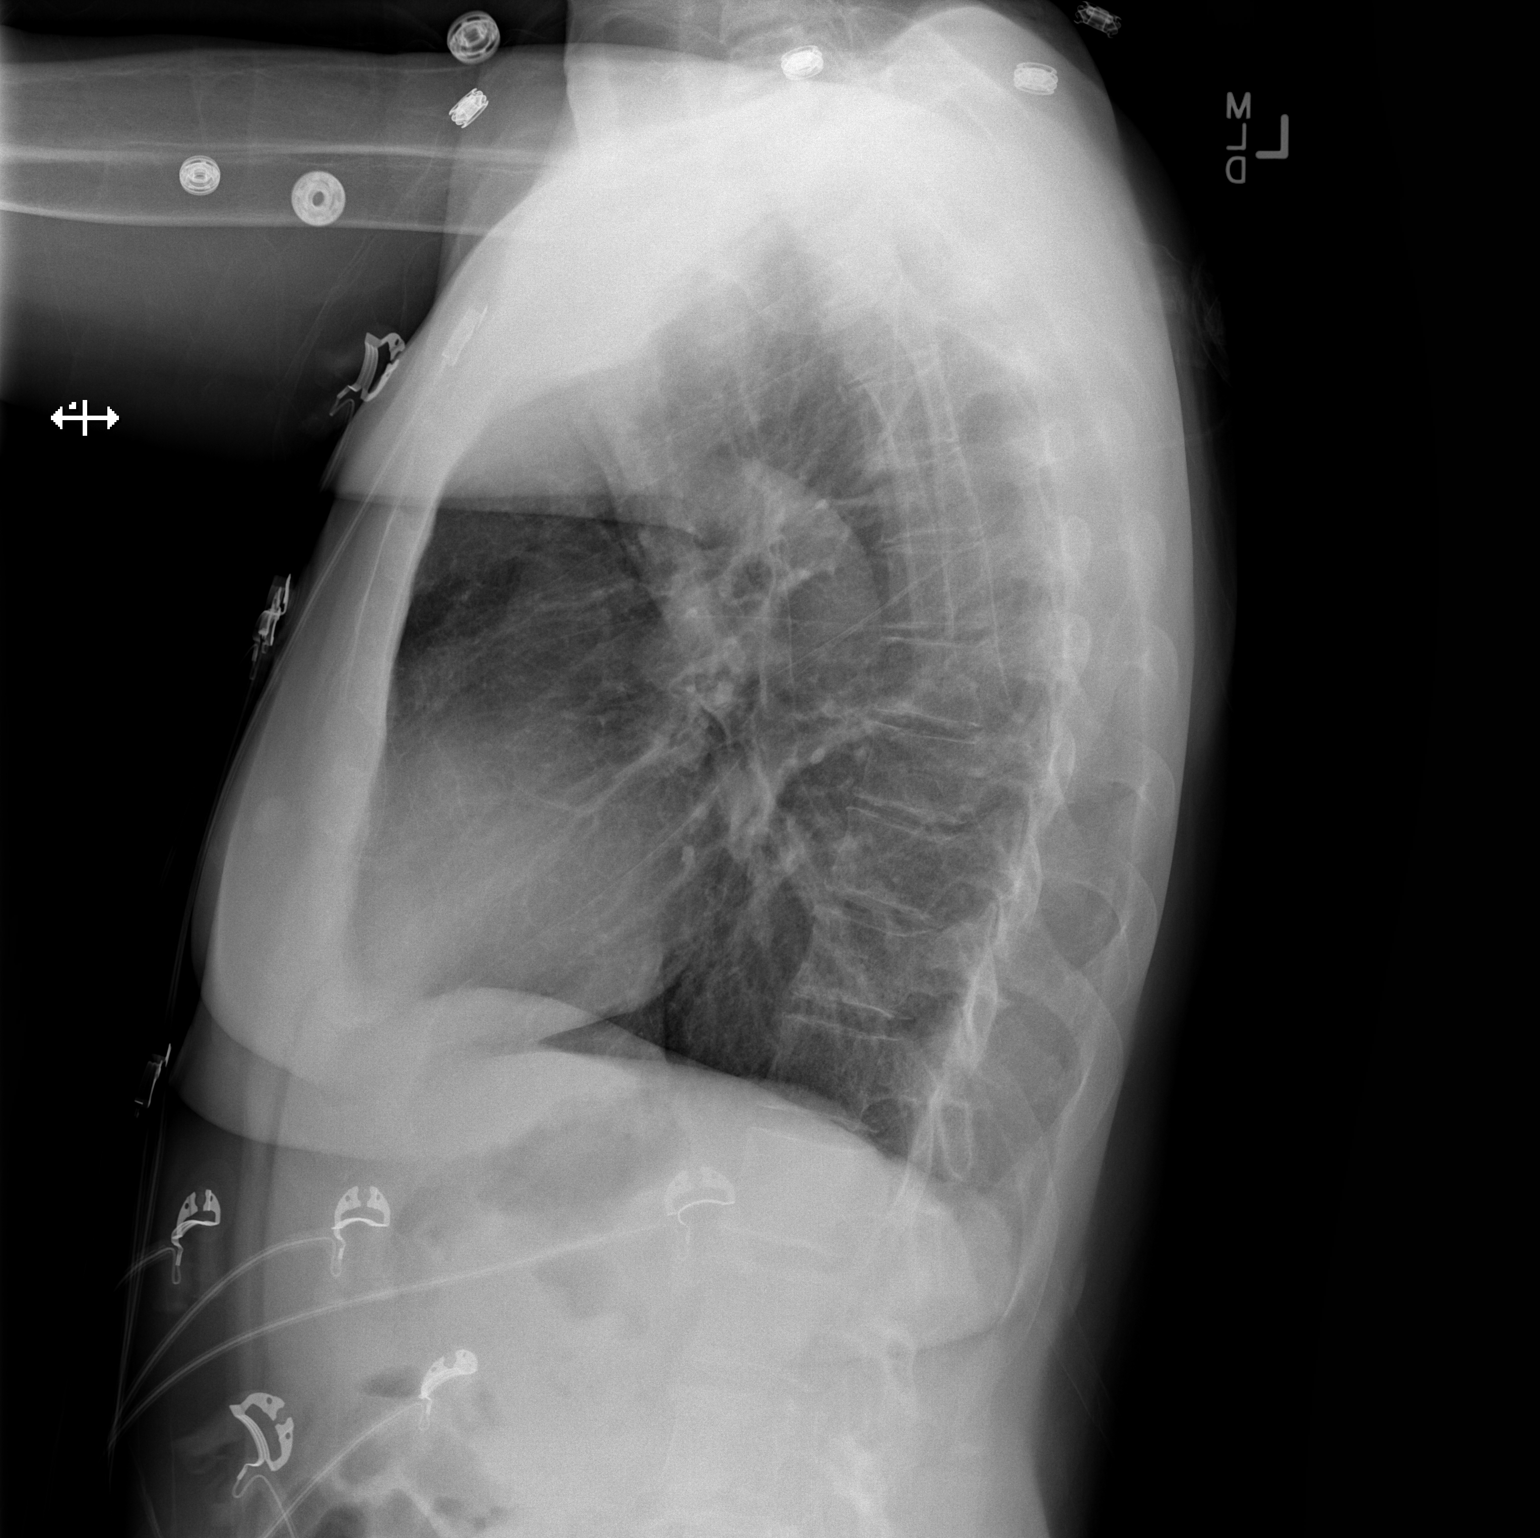

[2 of 2 positions shown; findings below may reference images not displayed]

FINDINGS: Cardiomediastinal silhouette is unremarkable. No acute infiltrate or
pleural effusion. No pulmonary edema. Bony thorax is unremarkable.
Bilateral nodular nipple shadow is noted.
IMPRESSION: No active cardiopulmonary disease.

## 2014-03-07 ENCOUNTER — Telehealth: Payer: Self-pay | Admitting: Internal Medicine

## 2014-03-07 MED ORDER — ATENOLOL 50 MG PO TABS: 50.0000 mg | ORAL_TABLET | Freq: Every day | ORAL | Status: DC

## 2014-03-07 NOTE — Telephone Encounter (Signed)
Rx refill sent to patient pharmacy   

## 2014-03-07 NOTE — Telephone Encounter (Signed)
Needs refill for Atenolol 50 mg  1 daily.  Would like a 6 month supply called to [facility]

## 2014-03-28 ENCOUNTER — Encounter: Payer: Self-pay | Admitting: Internal Medicine

## 2014-05-22 ENCOUNTER — Encounter: Payer: Self-pay | Admitting: Internal Medicine

## 2014-05-22 ENCOUNTER — Ambulatory Visit (INDEPENDENT_AMBULATORY_CARE_PROVIDER_SITE_OTHER): Payer: 59 | Admitting: Internal Medicine

## 2014-05-22 VITALS — BP 138/80 | HR 56 | Ht 64.5 in | Wt 149.8 lb

## 2014-05-22 DIAGNOSIS — C50919 Malignant neoplasm of unspecified site of unspecified female breast: Secondary | ICD-10-CM

## 2014-05-22 DIAGNOSIS — G458 Other transient cerebral ischemic attacks and related syndromes: Secondary | ICD-10-CM

## 2014-05-22 DIAGNOSIS — R002 Palpitations: Secondary | ICD-10-CM

## 2014-05-22 DIAGNOSIS — I491 Atrial premature depolarization: Secondary | ICD-10-CM

## 2014-05-22 NOTE — Patient Instructions (Signed)
Your physician wants you to follow-up in:  6 months. You will receive a reminder letter in the mail two months in advance. If you don't receive a letter, please call our office to schedule the follow-up appointment.   

## 2014-05-23 ENCOUNTER — Encounter: Payer: Self-pay | Admitting: Internal Medicine

## 2014-05-23 DIAGNOSIS — Z8673 Personal history of transient ischemic attack (TIA), and cerebral infarction without residual deficits: Secondary | ICD-10-CM | POA: Insufficient documentation

## 2014-05-23 NOTE — Progress Notes (Signed)
OFFICE NOTE  Chief Complaint:  Palpitations, mild dyspnea  Primary Care Physician: Maximino Greenland, MD  HPI:  Adrienne Lopez is a pleasant 62 year old female from [place]. She was noted to be hypercalcemic and ultimately diagnosed with hyperparathyroidism. She underwent parathyroid surgery by Dr. Harlow Asa in September. She did well since then however subsequently developed palpitations. She describes the palpitations as a hard thumping in her chest and neck were occurring several times a day after surgery. The symptoms have improved somewhat and it seems to be coincidental with her taking aspirin.  She does have a history of TIA in the past, but the episode was unclear as she "blacked out" when she was taking medication for breast cancer, specifically tamoxifen. She was taken off of that at that time. She did have a history of breast cancer and underwent chest wall radiation in 1999 but has been free of recurrence. Otherwise she has no other medical history and is currently not on any medications other than aspirin. He recently had blood work through Dr. Saul Fordyce office which indicates her thyroid function is low normal but her parathyroid is within normal limits and her calcium is now 9.3. She reports her palpitations occur a couple times a day and feel like a strong beating of the heart that causes her some mild shortness of breath. She denies any chest pain or anginal symptoms. There is no significant family history of heart disease.   At her last visit I recommended an echocardiogram and 48 hour monitor. She did wear the monitor for 48 hours which reported actually a number of both ventricular and atrial ectopic beats. Infectious thousand and 26 bigeminal ventricular beats. There was no significant nonsustained VT but there was some sustained supraventricular tachycardia.  She underwent an echocardiogram which was essentially normal except for some mitral regurgitation, but no wall motion  abnormalities and preserved EF of 55-60%.  Subsequent to this, she had an episode of chest pain which awoke her up at night on November 7. She went to the emergency department with symptoms of chest tightness and left arm pain. The symptoms resolved fairly quickly and she ruled out for MI. She did report some palpitations associated with this event.  In addition to her parathyroid problems, there is a question about thyroid abnormality as well. She is currently undergoing testing for this. She says all of these symptoms have come on since her surgery for the parathyroid glands.  Adrienne Lopez returns today and reports feeling well. She continues to have palpitations however less infrequently on increased dose atenolol.   PMHx:  Past Medical History  Diagnosis Date  . 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 TIA (transient ischemic attack) 12/26/2000    NO RESIDUAL PROBLEM  . Osteoporosis   . Hyperparathyroidism, primary     Past Surgical History  Procedure Laterality Date  . Partial hysterectomy  1991  . Abdominal hysterectomy    . Breast lumpectomy  1999    left  . Parathyroidectomy N/A 05/06/2013    Procedure: PARATHYROIDECTOMY;  Surgeon: Earnstine Regal, MD;  Location: [facility];  Service: General;  Laterality: N/A;    FAMHx:  Family History  Problem Relation Age of Onset  . Prostate cancer Father   . Heart attack Maternal Grandmother   . Cancer Maternal Grandfather     oral cancer  . Hypertension Mother     SOCHx:   reports that she has never smoked. She has never used smokeless  tobacco. She reports that she does not drink alcohol or use illicit drugs.  ALLERGIES:  No Known Allergies  ROS: A comprehensive review of systems was negative except for: Cardiovascular: positive for palpitations  HOME MEDS: Current Outpatient Prescriptions  Medication Sig Dispense Refill  . Ascorbic Acid (VITAMIN C) 1000 MG tablet Take 1,000 mg by mouth daily.      Marland Kitchen atenolol (TENORMIN) 50 MG tablet Take 1  tablet (50 mg total) by mouth daily.  30 tablet  6  . B Complex-C (B-COMPLEX WITH VITAMIN C) tablet Take 1 tablet by mouth daily. 100mg       . BIOTIN 5000 PO Take 1 tablet by mouth daily.       . cholecalciferol (VITAMIN D) 1000 UNITS tablet Take 5,000 Units by mouth daily.       . Multiple Vitamin (MULTIVITAMIN) capsule Take 1 capsule by mouth daily.      . vitamin E (VITAMIN E) 400 UNIT capsule Take 400 Units by mouth daily.       No current facility-administered medications for this visit.    LABS/IMAGING: No results found for this or any previous visit (from the past 48 hour(s)). No results found.  VITALS: BP 138/80  Pulse 56  Ht 5' 4.5" (1.638 m)  Wt 149 lb 12.8 oz (67.949 kg)  BMI 25.33 kg/m2  EXAM: GEN: Awake,NAD HEENT: PERLLA, EOMI LUNGS: CLEAR CV: RRR, S1.s2 ABD: S/NT EXT: No edema NEURO: Grossly intact PSYCH: Anxious  EKG: Sinus bradycardia at 56  ASSESSMENT: 1. Chest pain-resolved, low risk NST on 08/04/2013 2. Continued palpitations - PAC's and PVC;s with bigeminy 3. Recent hyperparathyroidism status post surgery 4. History of TIA 5. History of Breast CA s/p chemotherapy and chest wall radiation  PLAN: 1.   [name] has had some improvement in her palpitations with atenolol, however, they still persist. Overall she's doing well. She denies any chest pain. For some reason she has stopped taking aspirin. She does have a history of TIA therefore would recommend she continue that medication.  Thank you again for allowing me to participate in her care.  Pixie Casino, MD, [facility] Attending Cardiologist [facility]  Seichi Kaufhold C 05/23/2014, 6:06 PM

## 2014-10-04 ENCOUNTER — Other Ambulatory Visit: Payer: Self-pay | Admitting: Internal Medicine

## 2014-10-05 NOTE — Telephone Encounter (Signed)
Rx(s) sent to pharmacy electronically.  

## 2015-02-26 ENCOUNTER — Other Ambulatory Visit: Payer: Self-pay

## 2015-04-06 ENCOUNTER — Other Ambulatory Visit: Payer: Self-pay | Admitting: Internal Medicine

## 2015-04-06 NOTE — Telephone Encounter (Signed)
Rx(s) sent to pharmacy electronically.  

## 2015-06-05 ENCOUNTER — Other Ambulatory Visit: Payer: Self-pay | Admitting: Internal Medicine

## 2015-06-06 NOTE — Telephone Encounter (Signed)
REFILL 

## 2015-07-08 ENCOUNTER — Other Ambulatory Visit: Payer: Self-pay | Admitting: Internal Medicine

## 2015-09-04 ENCOUNTER — Other Ambulatory Visit: Payer: Self-pay | Admitting: Internal Medicine

## 2015-09-07 ENCOUNTER — Other Ambulatory Visit: Payer: Self-pay | Admitting: Internal Medicine

## 2015-09-24 ENCOUNTER — Other Ambulatory Visit: Payer: Self-pay | Admitting: Internal Medicine

## 2015-09-24 NOTE — Telephone Encounter (Signed)
Rx request sent to pharmacy.  

## 2015-10-10 ENCOUNTER — Ambulatory Visit (INDEPENDENT_AMBULATORY_CARE_PROVIDER_SITE_OTHER): Payer: 59 | Admitting: Internal Medicine

## 2015-10-10 ENCOUNTER — Encounter: Payer: Self-pay | Admitting: Internal Medicine

## 2015-10-10 VITALS — BP 126/82 | HR 59 | Ht 65.0 in | Wt 159.0 lb

## 2015-10-10 DIAGNOSIS — I493 Ventricular premature depolarization: Secondary | ICD-10-CM

## 2015-10-10 DIAGNOSIS — Z8673 Personal history of transient ischemic attack (TIA), and cerebral infarction without residual deficits: Secondary | ICD-10-CM | POA: Diagnosis not present

## 2015-10-10 DIAGNOSIS — I491 Atrial premature depolarization: Secondary | ICD-10-CM | POA: Diagnosis not present

## 2015-10-10 NOTE — Patient Instructions (Signed)
Your physician wants you to follow-up in: 1 year or sooner if needed. You will receive a reminder letter in the mail two months in advance. If you don't receive a letter, please call our office to schedule the follow-up appointment.   If you need a refill on your cardiac medications before your next appointment, please call your pharmacy.   

## 2015-10-10 NOTE — Progress Notes (Signed)
OFFICE NOTE  Chief Complaint:  Palpitations  Primary Care Physician: Maximino Greenland, MD  HPI:  Adrienne Lopez is a pleasant 65 year old female from [place]. She was noted to be hypercalcemic and ultimately diagnosed with hyperparathyroidism. She underwent parathyroid surgery by Dr. Harlow Asa in September. She did well since then however subsequently developed palpitations. She describes the palpitations as a hard thumping in her chest and neck were occurring several times a day after surgery. The symptoms have improved somewhat and it seems to be coincidental with her taking aspirin.  She does have a history of TIA in the past, but the episode was unclear as she "blacked out" when she was taking medication for breast cancer, specifically tamoxifen. She was taken off of that at that time. She did have a history of breast cancer and underwent chest wall radiation in 1999 but has been free of recurrence. Otherwise she has no other medical history and is currently not on any medications other than aspirin. He recently had blood work through Dr. Saul Fordyce office which indicates her thyroid function is low normal but her parathyroid is within normal limits and her calcium is now 9.3. She reports her palpitations occur a couple times a day and feel like a strong beating of the heart that causes her some mild shortness of breath. She denies any chest pain or anginal symptoms. There is no significant family history of heart disease.   At her last visit I recommended an echocardiogram and 48 hour monitor. She did wear the monitor for 48 hours which reported actually a number of both ventricular and atrial ectopic beats. Infectious thousand and 26 bigeminal ventricular beats. There was no significant nonsustained VT but there was some sustained supraventricular tachycardia.  She underwent an echocardiogram which was essentially normal except for some mitral regurgitation, but no wall motion abnormalities and  preserved EF of 55-60%.  Subsequent to this, she had an episode of chest pain which awoke her up at night on November 7. She went to the emergency department with symptoms of chest tightness and left arm pain. The symptoms resolved fairly quickly and she ruled out for MI. She did report some palpitations associated with this event.  In addition to her parathyroid problems, there is a question about thyroid abnormality as well. She is currently undergoing testing for this. She says all of these symptoms have come on since her surgery for the parathyroid glands.  Adrienne Lopez returns today and reports feeling well. She continues to have infrequent palpitations, despite atenolol.  She denies any chest pain or worsening shortness of breath. She's had no further TIA events. She's been compliant with daily aspirin.  PMHx:  Past Medical History  Diagnosis Date  . Cancer [facility]) 1999    breast, left  . TIA (transient ischemic attack) 12/26/2000    NO RESIDUAL PROBLEM  . Osteoporosis   . Hyperparathyroidism, primary [facility])     Past Surgical History  Procedure Laterality Date  . Partial hysterectomy  1991  . Abdominal hysterectomy    . Breast lumpectomy  1999    left  . Parathyroidectomy N/A 05/06/2013    Procedure: PARATHYROIDECTOMY;  Surgeon: Earnstine Regal, MD;  Location: [facility];  Service: General;  Laterality: N/A;    FAMHx:  Family History  Problem Relation Age of Onset  . Prostate cancer Father   . Heart attack Maternal Grandmother   . Cancer Maternal Grandfather     oral cancer  . Hypertension Mother  SOCHx:   reports that she has never smoked. She has never used smokeless tobacco. She reports that she does not drink alcohol or use illicit drugs.  ALLERGIES:  No Known Allergies  ROS: A comprehensive review of systems was negative except for: Cardiovascular: positive for palpitations  HOME MEDS: Current Outpatient Prescriptions  Medication Sig Dispense Refill  . Ascorbic Acid  (VITAMIN C) 1000 MG tablet Take 1,000 mg by mouth daily.    Marland Kitchen aspirin 81 MG tablet Take 81 mg by mouth daily.    Marland Kitchen atenolol (TENORMIN) 50 MG tablet Take 1 tablet (50 mg total) by mouth daily. 20 tablet 1  . B Complex-C (B-COMPLEX WITH VITAMIN C) tablet Take 1 tablet by mouth daily. 100mg     . BIOTIN 5000 PO Take 1 tablet by mouth daily.     . cholecalciferol (VITAMIN D) 1000 UNITS tablet Take 5,000 Units by mouth daily.     . Cyanocobalamin (VITAMIN B-12 PO) Take 2,500 mg by mouth daily.    . Multiple Vitamin (MULTIVITAMIN) capsule Take 1 capsule by mouth daily.    . vitamin E (VITAMIN E) 400 UNIT capsule Take 400 Units by mouth daily.     No current facility-administered medications for this visit.    LABS/IMAGING: No results found for this or any previous visit (from the past 48 hour(s)). No results found.  VITALS: BP 126/82 mmHg  Pulse 59  Ht 5\' 5"  (1.651 m)  Wt 159 lb (72.122 kg)  BMI 26.46 kg/m2  EXAM: GEN: Awake,NAD HEENT: PERLLA, EOMI LUNGS: CLEAR CV: RRR, S1.s2 ABD: S/NT EXT: No edema NEURO: Grossly intact PSYCH: Anxious  EKG: Sinus bradycardia at 59  ASSESSMENT: 1. Chest pain-resolved, low risk NST on 08/04/2013 2. Continued palpitations - PAC's and PVC;s with bigeminy 3. Recent hyperparathyroidism status post surgery 4. History of TIA 5. History of Breast CA s/p chemotherapy and chest wall radiation  PLAN: 1.   Adrienne Lopez has had some improvement in her palpitations with atenolol, however, they still persist. Overall she's doing well. She denies any chest pain. She is on daily aspirin for history of TIA. She is followed by Dr. Chalmers Cater for her hyperparathyroidism and has an appointment this week.  Overall she's doing well we'll plan to see her back in early or sooner as necessary.   Pixie Casino, MD, [facility] Attending Cardiologist [place] [facility] 10/10/2015, 9:29 AM

## 2015-11-04 ENCOUNTER — Other Ambulatory Visit: Payer: Self-pay | Admitting: Internal Medicine

## 2015-11-05 NOTE — Telephone Encounter (Signed)
Rx(s) sent to pharmacy electronically.  

## 2016-05-12 ENCOUNTER — Other Ambulatory Visit: Payer: Self-pay

## 2016-05-12 MED ORDER — ATENOLOL 50 MG PO TABS: 50.0000 mg | ORAL_TABLET | Freq: Every day | ORAL | 11 refills | Status: DC

## 2016-05-13 ENCOUNTER — Telehealth: Payer: Self-pay | Admitting: *Deleted

## 2016-05-13 NOTE — Telephone Encounter (Signed)
Patient calling because she only has 2 tablets of Atenolol left and wants someone to call her on what to do about her Atenolol. She would prefer to speak with Dr. Debara Pickett or his nurse. I let patient know e have sent the message to Dr. Debara Pickett, when makes the decision of what medication to switch to we will let her know and send the prescription in. She wants to here from someone today.

## 2016-05-13 NOTE — Telephone Encounter (Signed)
Attempt call to cell phone-no answer, lmtcb. Spoke to [employer] on backorder.  Routed to pharmacist for recommendations.

## 2016-05-13 NOTE — Telephone Encounter (Signed)
Attempt to return call-no answer, lmtcb. 

## 2016-05-14 MED ORDER — METOPROLOL SUCCINATE ER 50 MG PO TB24: 50.0000 mg | ORAL_TABLET | Freq: Every day | ORAL | 3 refills | Status: DC

## 2016-05-14 NOTE — Telephone Encounter (Signed)
error 

## 2016-05-14 NOTE — Telephone Encounter (Signed)
Spoke to patient will change to metoprolol XL 50mg  daily. She will call if medication does not control symptoms or if she has any issues with the medication.  She states understanding and appreciation for phone call.

## 2016-05-14 NOTE — Telephone Encounter (Signed)
Received call from patient-pt unable to get atenolol from pharmacy and only has one tablet left.  Advised I have routed to pharmacist to help with medication change.  Verbalized understanding and asked we call her back on her cell # [phone]

## 2016-05-18 NOTE — Telephone Encounter (Signed)
Could switch atenolol to Toprol XL 50 mg daily or metoprolol tartrate 25 mg BID.  DR. Lemmie Evens

## 2016-12-02 ENCOUNTER — Ambulatory Visit (INDEPENDENT_AMBULATORY_CARE_PROVIDER_SITE_OTHER): Payer: BLUE CROSS/BLUE SHIELD | Admitting: Internal Medicine

## 2016-12-02 ENCOUNTER — Other Ambulatory Visit: Payer: Self-pay | Admitting: Internal Medicine

## 2016-12-02 ENCOUNTER — Encounter: Payer: Self-pay | Admitting: Internal Medicine

## 2016-12-02 VITALS — BP 158/86 | HR 56 | Ht 65.0 in | Wt 147.0 lb

## 2016-12-02 DIAGNOSIS — I491 Atrial premature depolarization: Secondary | ICD-10-CM

## 2016-12-02 DIAGNOSIS — I493 Ventricular premature depolarization: Secondary | ICD-10-CM | POA: Diagnosis not present

## 2016-12-02 MED ORDER — ATENOLOL 50 MG PO TABS: 50.0000 mg | ORAL_TABLET | Freq: Every day | ORAL | 3 refills | Status: DC

## 2016-12-02 NOTE — Progress Notes (Signed)
OFFICE NOTE  Chief Complaint:  No complaints  Primary Care Physician: Maximino Greenland, MD  HPI:  Adrienne Lopez is a pleasant 66 year old female from [place]. She was noted to be hypercalcemic and ultimately diagnosed with hyperparathyroidism. She underwent parathyroid surgery by Dr. Harlow Asa in September. She did well since then however subsequently developed palpitations. She describes the palpitations as a hard thumping in her chest and neck were occurring several times a day after surgery. The symptoms have improved somewhat and it seems to be coincidental with her taking aspirin.  She does have a history of TIA in the past, but the episode was unclear as she "blacked out" when she was taking medication for breast cancer, specifically tamoxifen. She was taken off of that at that time. She did have a history of breast cancer and underwent chest wall radiation in 1999 but has been free of recurrence. Otherwise she has no other medical history and is currently not on any medications other than aspirin. He recently had blood work through Dr. Saul Fordyce office which indicates her thyroid function is low normal but her parathyroid is within normal limits and her calcium is now 9.3. She reports her palpitations occur a couple times a day and feel like a strong beating of the heart that causes her some mild shortness of breath. She denies any chest pain or anginal symptoms. There is no significant family history of heart disease.   At her last visit I recommended an echocardiogram and 48 hour monitor. She did wear the monitor for 48 hours which reported actually a number of both ventricular and atrial ectopic beats. Infectious thousand and 26 bigeminal ventricular beats. There was no significant nonsustained VT but there was some sustained supraventricular tachycardia.  She underwent an echocardiogram which was essentially normal except for some mitral regurgitation, but no wall motion abnormalities and  preserved EF of 55-60%.  Subsequent to this, she had an episode of chest pain which awoke her up at night on November 7. She went to the emergency department with symptoms of chest tightness and left arm pain. The symptoms resolved fairly quickly and she ruled out for MI. She did report some palpitations associated with this event.  In addition to her parathyroid problems, there is a question about thyroid abnormality as well. She is currently undergoing testing for this. She says all of these symptoms have come on since her surgery for the parathyroid glands.  Adrienne Lopez returns today and reports feeling well. She continues to have infrequent palpitations, despite atenolol.  She denies any chest pain or worsening shortness of breath. She's had no further TIA events. She's been compliant with daily aspirin.  12/02/2016  Adrienne Lopez returns for follow-up. Over the past year she has done well without complaints. She denies any palpitations. She followed up with Dr. Chalmers Cater and was noted to have elevated blood sugars - she was told to lose 10 pounds and she was able to successfully. BP was mildly elevated today, but came down to 134/86 after a re-check.  PMHx:  Past Medical History:  Diagnosis Date  . Cancer [facility]) 1999   breast, left  . Hyperparathyroidism, primary ([place])   . Osteoporosis   . TIA (transient ischemic attack) 12/26/2000   NO RESIDUAL PROBLEM    Past Surgical History:  Procedure Laterality Date  . ABDOMINAL HYSTERECTOMY    . BREAST LUMPECTOMY  1999   left  . PARATHYROIDECTOMY N/A 05/06/2013   Procedure: PARATHYROIDECTOMY;  Surgeon:  Earnstine Regal, MD;  Location: [facility];  Service: General;  Laterality: N/A;  . PARTIAL HYSTERECTOMY  1991    FAMHx:  Family History  Problem Relation Age of Onset  . Prostate cancer Father   . Heart attack Maternal Grandmother   . Cancer Maternal Grandfather     oral cancer  . Hypertension Mother     SOCHx:   reports that she has never smoked.  She has never used smokeless tobacco. She reports that she does not drink alcohol or use drugs.  ALLERGIES:  No Known Allergies  ROS: Pertinent items noted in HPI and remainder of comprehensive ROS otherwise negative.  HOME MEDS: Current Outpatient Prescriptions  Medication Sig Dispense Refill  . Ascorbic Acid (VITAMIN C) 1000 MG tablet Take 1,000 mg by mouth daily.    Marland Kitchen aspirin 81 MG tablet Take 81 mg by mouth daily.    Marland Kitchen atenolol (TENORMIN) 50 MG tablet Take 1 tablet (50 mg total) by mouth daily. 30 tablet 11  . B Complex-C (B-COMPLEX WITH VITAMIN C) tablet Take 1 tablet by mouth daily. 100mg     . BIOTIN 5000 PO Take 1 tablet by mouth daily.     . cholecalciferol (VITAMIN D) 1000 UNITS tablet Take 5,000 Units by mouth daily.     . Cyanocobalamin (VITAMIN B-12 PO) Take 2,500 mg by mouth daily.    . Multiple Vitamin (MULTIVITAMIN) capsule Take 1 capsule by mouth daily.    . vitamin E (VITAMIN E) 400 UNIT capsule Take 400 Units by mouth daily.     No current facility-administered medications for this visit.     LABS/IMAGING: No results found for this or any previous visit (from the past 48 hour(s)). No results found.  VITALS: BP (!) 158/86   Pulse (!) 56   Ht 5\' 5"  (1.651 m)   Wt 147 lb (66.7 kg)   BMI 24.46 kg/m   EXAM: GEN: Awake,NAD HEENT: PERLLA, EOMI LUNGS: CLEAR CV: RRR, S1.s2 ABD: S/NT EXT: No edema NEURO: Grossly intact PSYCH: Anxious  EKG: Sinus bradycardia at 59  ASSESSMENT: 1. Chest pain-resolved, low risk NST on 08/04/2013 2. Continued palpitations - PAC's and PVC;s with bigeminy 3. Recent hyperparathyroidism status post surgery 4. History of TIA 5. History of Breast CA s/p chemotherapy and chest wall radiation  PLAN: 1.   Adrienne Lopez has very infrequent and tolerable palpitations on atenolol. No new symptoms over the past year. She lost 10 lbs and feels great and BG's have improved.  Follow-up with me annually or sooner as necessary.   Pixie Casino, MD, [facility] Attending Cardiologist [place] 12/02/2016, 8:59 AM

## 2016-12-02 NOTE — Patient Instructions (Signed)
Your physician recommends that you schedule a follow-up appointment in: 1 year with Dr. Hilty 

## 2017-03-12 DIAGNOSIS — Z79899 Other long term (current) drug therapy: Secondary | ICD-10-CM | POA: Diagnosis not present

## 2017-03-12 DIAGNOSIS — Z0000 Encounter for general adult medical examination without abnormal findings: Secondary | ICD-10-CM | POA: Diagnosis not present

## 2017-03-12 DIAGNOSIS — R7309 Other abnormal glucose: Secondary | ICD-10-CM | POA: Diagnosis not present

## 2017-03-19 DIAGNOSIS — Z0000 Encounter for general adult medical examination without abnormal findings: Secondary | ICD-10-CM | POA: Diagnosis not present

## 2017-03-19 DIAGNOSIS — R002 Palpitations: Secondary | ICD-10-CM | POA: Diagnosis not present

## 2017-03-19 DIAGNOSIS — Z712 Person consulting for explanation of examination or test findings: Secondary | ICD-10-CM | POA: Diagnosis not present

## 2017-07-28 DIAGNOSIS — H2513 Age-related nuclear cataract, bilateral: Secondary | ICD-10-CM | POA: Diagnosis not present

## 2017-08-21 DIAGNOSIS — Z1231 Encounter for screening mammogram for malignant neoplasm of breast: Secondary | ICD-10-CM | POA: Diagnosis not present

## 2017-08-21 DIAGNOSIS — Z853 Personal history of malignant neoplasm of breast: Secondary | ICD-10-CM | POA: Diagnosis not present

## 2017-11-18 ENCOUNTER — Other Ambulatory Visit: Payer: Self-pay | Admitting: Internal Medicine

## 2018-01-05 ENCOUNTER — Encounter: Payer: Self-pay | Admitting: Internal Medicine

## 2018-01-05 ENCOUNTER — Ambulatory Visit: Payer: BLUE CROSS/BLUE SHIELD | Admitting: Internal Medicine

## 2018-01-05 VITALS — BP 118/62 | HR 53 | Ht 65.0 in | Wt 143.2 lb

## 2018-01-05 DIAGNOSIS — I491 Atrial premature depolarization: Secondary | ICD-10-CM

## 2018-01-05 DIAGNOSIS — I493 Ventricular premature depolarization: Secondary | ICD-10-CM

## 2018-01-05 NOTE — Patient Instructions (Signed)
Your physician wants you to follow-up in: ONE YEAR with Dr. Hilty. You will receive a reminder letter in the mail two months in advance. If you don't receive a letter, please call our office to schedule the follow-up appointment.  

## 2018-01-06 ENCOUNTER — Encounter: Payer: Self-pay | Admitting: Internal Medicine

## 2018-01-06 NOTE — Progress Notes (Signed)
OFFICE NOTE  Chief Complaint:  No complaints  Primary Care Physician: Glendale Chard, MD  HPI:  Adrienne Lopez is a pleasant 67 year old female from [place]. She was noted to be hypercalcemic and ultimately diagnosed with hyperparathyroidism. She underwent parathyroid surgery by Dr. Harlow Asa in September. She did well since then however subsequently developed palpitations. She describes the palpitations as a hard thumping in her chest and neck were occurring several times a day after surgery. The symptoms have improved somewhat and it seems to be coincidental with her taking aspirin.  She does have a history of TIA in the past, but the episode was unclear as she "blacked out" when she was taking medication for breast cancer, specifically tamoxifen. She was taken off of that at that time. She did have a history of breast cancer and underwent chest wall radiation in 1999 but has been free of recurrence. Otherwise she has no other medical history and is currently not on any medications other than aspirin. He recently had blood work through Dr. Saul Fordyce office which indicates her thyroid function is low normal but her parathyroid is within normal limits and her calcium is now 9.3. She reports her palpitations occur a couple times a day and feel like a strong beating of the heart that causes her some mild shortness of breath. She denies any chest pain or anginal symptoms. There is no significant family history of heart disease.   At her last visit I recommended an echocardiogram and 48 hour monitor. She did wear the monitor for 48 hours which reported actually a number of both ventricular and atrial ectopic beats. Infectious thousand and 26 bigeminal ventricular beats. There was no significant nonsustained VT but there was some sustained supraventricular tachycardia.  She underwent an echocardiogram which was essentially normal except for some mitral regurgitation, but no wall motion abnormalities and  preserved EF of 55-60%.  Subsequent to this, she had an episode of chest pain which awoke her up at night on November 7. She went to the emergency department with symptoms of chest tightness and left arm pain. The symptoms resolved fairly quickly and she ruled out for MI. She did report some palpitations associated with this event.  In addition to her parathyroid problems, there is a question about thyroid abnormality as well. She is currently undergoing testing for this. She says all of these symptoms have come on since her surgery for the parathyroid glands.  Adrienne Lopez returns today and reports feeling well. She continues to have infrequent palpitations, despite atenolol.  She denies any chest pain or worsening shortness of breath. She's had no further TIA events. She's been compliant with daily aspirin.  12/02/2016  Adrienne Lopez returns for follow-up. Over the past year she has done well without complaints. She denies any palpitations. She followed up with Dr. Chalmers Cater and was noted to have elevated blood sugars - she was told to lose 10 pounds and she was able to successfully. BP was mildly elevated today, but came down to 134/86 after a re-check.  01/05/2018  Adrienne Lopez was seen today in follow-up.  Again she continues to do well.  She denies chest pain or worsening shortness of breath.  She has no palpitations.  Labs from July 2018 showed total cholesterol 195, HDL 66, LDL 111 and triglycerides 92.  Serum creatinine of 1.07.  Blood pressures been stable.  EKG today shows sinus bradycardia at 53.  PMHx:  Past Medical History:  Diagnosis Date  .  Cancer [facility]) 1999   breast, left  . Hyperparathyroidism, primary ([place])   . Osteoporosis   . TIA (transient ischemic attack) 12/26/2000   NO RESIDUAL PROBLEM    Past Surgical History:  Procedure Laterality Date  . ABDOMINAL HYSTERECTOMY    . BREAST LUMPECTOMY  1999   left  . PARATHYROIDECTOMY N/A 05/06/2013   Procedure: PARATHYROIDECTOMY;  Surgeon:  Earnstine Regal, MD;  Location: [facility];  Service: General;  Laterality: N/A;  . PARTIAL HYSTERECTOMY  1991    FAMHx:  Family History  Problem Relation Age of Onset  . Prostate cancer Father   . Heart attack Maternal Grandmother   . Cancer Maternal Grandfather        oral cancer  . Hypertension Mother     SOCHx:   reports that she has never smoked. She has never used smokeless tobacco. She reports that she does not drink alcohol or use drugs.  ALLERGIES:  No Known Allergies  ROS: Pertinent items noted in HPI and remainder of comprehensive ROS otherwise negative.  HOME MEDS: Current Outpatient Medications  Medication Sig Dispense Refill  . Ascorbic Acid (VITAMIN C) 1000 MG tablet Take 1,000 mg by mouth daily.    Marland Kitchen aspirin 81 MG tablet Take 81 mg by mouth daily.    Marland Kitchen atenolol (TENORMIN) 50 MG tablet TAKE 1 TABLET BY MOUTH ONCE DAILY 90 tablet 3  . B Complex-C (B-COMPLEX WITH VITAMIN C) tablet Take 1 tablet by mouth daily. 100mg     . BIOTIN 5000 PO Take 1 tablet by mouth daily.     . cholecalciferol (VITAMIN D) 1000 UNITS tablet Take 5,000 Units by mouth daily.     . Cyanocobalamin (VITAMIN B-12 PO) Take 2,500 mg by mouth daily.    . Multiple Vitamin (MULTIVITAMIN) capsule Take 1 capsule by mouth daily.    . vitamin E (VITAMIN E) 400 UNIT capsule Take 400 Units by mouth daily.     No current facility-administered medications for this visit.     LABS/IMAGING: No results found for this or any previous visit (from the past 48 hour(s)). No results found.  VITALS: BP 118/62   Pulse (!) 53   Ht 5\' 5"  (1.651 m)   Wt 143 lb 3.2 oz (65 kg)   BMI 23.83 kg/m   EXAM: General appearance: alert and no distress Neck: no carotid bruit, no JVD and thyroid not enlarged, symmetric, no tenderness/mass/nodules Lungs: clear to auscultation bilaterally Heart: regular rate and rhythm, S1, S2 normal, no murmur, click, rub or gallop Abdomen: soft, non-tender; bowel sounds normal; no masses,   no organomegaly Extremities: extremities normal, atraumatic, no cyanosis or edema Pulses: 2+ and symmetric Skin: Skin color, texture, turgor normal. No rashes or lesions Neurologic: Grossly normal Psych: Pleasant  EKG: Sinus bradycardia 53-personally reviewed  ASSESSMENT: 1. Chest pain-resolved, low risk NST on 08/04/2013 2. Continued palpitations - PAC's and PVC;s with bigeminy 3. Recent hyperparathyroidism status post surgery 4. History of TIA 5. History of Breast CA s/p chemotherapy and chest wall radiation  PLAN: 1.   Ms. Lopez continues to feel well without recurrent palpitations.  EKG shows sinus bradycardia today without ectopy.  She denies any chest pain or worsening shortness of breath.  I encouraged more activity and continued healthy diet.  She can follow-up with me annually or sooner as necessary.  Pixie Casino, MD, [facility], [place] Director of the Advanced Lipid Disorders &  Cardiovascular Risk Reduction Clinic  Diplomate of the [employer] of Clinical Lipidology Attending Cardiologist  Direct Dial: [phone]  Fax: [phone]  Website:  [email or web address] 01/06/2018, 6:41 PM

## 2018-03-17 DIAGNOSIS — Z0000 Encounter for general adult medical examination without abnormal findings: Secondary | ICD-10-CM | POA: Diagnosis not present

## 2018-03-17 DIAGNOSIS — Z131 Encounter for screening for diabetes mellitus: Secondary | ICD-10-CM | POA: Diagnosis not present

## 2018-03-17 DIAGNOSIS — Z13220 Encounter for screening for lipoid disorders: Secondary | ICD-10-CM | POA: Diagnosis not present

## 2018-03-25 DIAGNOSIS — Z0000 Encounter for general adult medical examination without abnormal findings: Secondary | ICD-10-CM | POA: Diagnosis not present

## 2018-03-25 DIAGNOSIS — R7309 Other abnormal glucose: Secondary | ICD-10-CM | POA: Diagnosis not present

## 2018-03-25 DIAGNOSIS — E785 Hyperlipidemia, unspecified: Secondary | ICD-10-CM | POA: Diagnosis not present

## 2018-04-16 DIAGNOSIS — R945 Abnormal results of liver function studies: Secondary | ICD-10-CM | POA: Diagnosis not present

## 2018-04-19 DIAGNOSIS — D126 Benign neoplasm of colon, unspecified: Secondary | ICD-10-CM | POA: Diagnosis not present

## 2018-04-19 DIAGNOSIS — K5730 Diverticulosis of large intestine without perforation or abscess without bleeding: Secondary | ICD-10-CM | POA: Diagnosis not present

## 2018-04-19 DIAGNOSIS — Z86010 Personal history of colonic polyps: Secondary | ICD-10-CM | POA: Diagnosis not present

## 2018-04-19 DIAGNOSIS — K649 Unspecified hemorrhoids: Secondary | ICD-10-CM | POA: Diagnosis not present

## 2018-04-19 LAB — HM COLONOSCOPY

## 2018-04-21 DIAGNOSIS — D126 Benign neoplasm of colon, unspecified: Secondary | ICD-10-CM | POA: Diagnosis not present

## 2018-08-10 DIAGNOSIS — H5213 Myopia, bilateral: Secondary | ICD-10-CM | POA: Diagnosis not present

## 2018-08-27 DIAGNOSIS — Z853 Personal history of malignant neoplasm of breast: Secondary | ICD-10-CM | POA: Diagnosis not present

## 2018-08-27 DIAGNOSIS — Z1231 Encounter for screening mammogram for malignant neoplasm of breast: Secondary | ICD-10-CM | POA: Diagnosis not present

## 2018-08-27 LAB — HM MAMMOGRAPHY: HM Mammogram: NORMAL (ref 0–4)

## 2018-11-08 ENCOUNTER — Other Ambulatory Visit: Payer: Self-pay | Admitting: Internal Medicine

## 2018-11-08 MED ORDER — ATENOLOL 50 MG PO TABS: 50.0000 mg | ORAL_TABLET | Freq: Every day | ORAL | 0 refills | Status: DC

## 2018-11-08 NOTE — Telephone Encounter (Signed)
°*  STAT* If patient is at the pharmacy, call can be transferred to refill team.   1. Which medications need to be refilled? (please list name of each medication and dose if known) atenolol (TENORMIN) 50 MG tablet  2. Which pharmacy/location (including street and city if local pharmacy) is medication to be sent to? [place], [place] - [address]  3. Do they need a 30 day or 90 day supply? 90 days

## 2019-01-06 ENCOUNTER — Telehealth: Payer: Self-pay | Admitting: Internal Medicine

## 2019-01-06 NOTE — Telephone Encounter (Signed)
Smartphone/consent/ my chart/ pre reg completed °

## 2019-01-07 ENCOUNTER — Encounter: Payer: Self-pay | Admitting: Internal Medicine

## 2019-01-07 ENCOUNTER — Telehealth (INDEPENDENT_AMBULATORY_CARE_PROVIDER_SITE_OTHER): Payer: BLUE CROSS/BLUE SHIELD | Admitting: Internal Medicine

## 2019-01-07 ENCOUNTER — Telehealth: Payer: Self-pay | Admitting: Internal Medicine

## 2019-01-07 VITALS — BP 174/92 | HR 61 | Temp 98.5°F | Ht 65.0 in | Wt 141.1 lb

## 2019-01-07 DIAGNOSIS — I493 Ventricular premature depolarization: Secondary | ICD-10-CM | POA: Diagnosis not present

## 2019-01-07 DIAGNOSIS — Z7189 Other specified counseling: Secondary | ICD-10-CM

## 2019-01-07 DIAGNOSIS — R030 Elevated blood-pressure reading, without diagnosis of hypertension: Secondary | ICD-10-CM

## 2019-01-07 NOTE — Patient Instructions (Addendum)
Medication Instructions:  Your physician recommends that you continue on your current medications as directed. Please refer to the Current Medication list given to you today.  If you need a refill on your cardiac medications before your next appointment, please call your pharmacy.   Lab work: None needed If you have labs (blood work) drawn today and your tests are completely normal, you will receive your results only by: Marland Kitchen MyChart Message (if you have MyChart) OR . A paper copy in the mail If you have any lab test that is abnormal or we need to change your treatment, we will call you to review the results.  Testing/Procedures: None needed  Follow-Up: At [facility], you and your health needs are our priority.  As part of our continuing mission to provide you with exceptional heart care, we have created designated Provider Care Teams.  These Care Teams include your primary Cardiologist (physician) and Advanced Practice Providers (APPs -  Physician Assistants and Nurse Practitioners) who all work together to provide you with the care you need, when you need it. You will need a follow up appointment in 3 months. You may see Dr. Debara Pickett or one of the following Advanced Practice Providers on your designated Care Team: Almyra Deforest, [place] . Fabian Sharp, PA-C  Any Other Special Instructions Will Be Listed Below (If Applicable).  Dr. Debara Pickett has requested that you monitor your BP at home twice daily. Please record your readings and call our office with these or send to the doctor via [place]:  Rest 5 minutes before taking your blood pressure.  Don't smoke or drink caffeinated beverages for at least 30 minutes before.  Take your blood pressure before (not after) you eat.  Sit comfortably with your back supported and both feet on the floor (don't cross your legs).  Elevate your arm to heart level on a table or a desk.  Use the proper sized cuff. It should fit  smoothly and snugly around your bare upper arm. There should be enough room to slip a fingertip under the cuff. The bottom edge of the cuff should be 1 inch above the crease of the elbow.  Ideally, take 3 measurements at one sitting and record the average.

## 2019-01-07 NOTE — Telephone Encounter (Signed)
Patient called to review telemedicine visit instructions. She will monitor BP at home and call with readings or send via MyChart next week. No further assistance needed at this time.

## 2019-01-07 NOTE — Progress Notes (Signed)
Virtual Visit via Video Note   This visit type was conducted due to national recommendations for restrictions regarding the COVID-19 Pandemic (e.g. social distancing) in an effort to limit this patient's exposure and mitigate transmission in our community.  Due to her co-morbid illnesses, this patient is at least at moderate risk for complications without adequate follow up.  This format is felt to be most appropriate for this patient at this time.  All issues noted in this document were discussed and addressed.  A limited physical exam was performed with this format.  Please refer to the patient's chart for her consent to telehealth for [facility].   Evaluation Performed:  Doximity video visit  Date:  01/07/2019   ID:  Adrienne Lopez, DOB 1951-04-22, MRN [id number]  Patient Location:  [address] [place] [address]  Provider location:   [address] [place], [place] [address]  PCP:  Glendale Chard, MD  Cardiologist:  No primary care provider on file. Electrophysiologist:  None   Chief Complaint:  No complaints  History of Present Illness:    Adrienne Lopez is a 68 y.o. female who presents via audio/video conferencing for a telehealth visit today.  Mrs. Adrienne Lopez was seen today for video follow-up.  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  There is also a remote TIA.  She has had no residual effects from that.  Overall she is feeling well.  She does not regularly monitor her blood pressure which was recorded as normal during her last office visit.  She is on atenolol for PVCs.  She did borrow a blood pressure cuff from her sister who was a nurse and got some initially high readings as high as 992 systolic and then 426/83.  She does not routinely monitor this, again and this was considered high.  She says she is been under a lot of stress, needing to meet some deadlines and working at home.  The patient does not have symptoms concerning  for COVID-19 infection (fever, chills, cough, or new SHORTNESS OF BREATH).    Prior CV studies:   The following studies were reviewed today:  Chart review  PMHx:  Past Medical History:  Diagnosis Date  . Cancer [facility]) 1999   breast, left  . Hyperparathyroidism, primary ([place])   . Osteoporosis   . TIA (transient ischemic attack) 12/26/2000   NO RESIDUAL PROBLEM    Past Surgical History:  Procedure Laterality Date  . ABDOMINAL HYSTERECTOMY    . BREAST LUMPECTOMY  1999   left  . PARATHYROIDECTOMY N/A 05/06/2013   Procedure: PARATHYROIDECTOMY;  Surgeon: Earnstine Regal, MD;  Location: [facility];  Service: General;  Laterality: N/A;  . PARTIAL HYSTERECTOMY  1991    FAMHx:  Family History  Problem Relation Age of Onset  . Prostate cancer Father   . Heart attack Maternal Grandmother   . Cancer Maternal Grandfather        oral cancer  . Hypertension Mother     SOCHx:   reports that she has never smoked. She has never used smokeless tobacco. She reports that she does not drink alcohol or use drugs.  ALLERGIES:  No Known Allergies  MEDS:  Current Meds  Medication Sig  . Ascorbic Acid (VITAMIN C) 1000 MG tablet Take 1,000 mg by mouth daily.  Marland Kitchen aspirin 81 MG tablet Take 81 mg by mouth daily.  Marland Kitchen atenolol (TENORMIN) 50 MG tablet Take 1 tablet (50  mg total) by mouth daily.  . B Complex-C (B-COMPLEX WITH VITAMIN C) tablet Take 1 tablet by mouth daily. 100mg   . BIOTIN 5000 PO Take 1 tablet by mouth daily.   . cholecalciferol (VITAMIN D) 1000 UNITS tablet Take 5,000 Units by mouth daily.   . Cyanocobalamin (VITAMIN B-12 PO) Take 2,500 mg by mouth daily.  . Multiple Vitamin (MULTIVITAMIN) capsule Take 1 capsule by mouth daily.  . vitamin E (VITAMIN E) 400 UNIT capsule Take 400 Units by mouth daily.     ROS: Pertinent items noted in HPI and remainder of comprehensive ROS otherwise negative.  Labs/Other Tests and Data Reviewed:    Recent Labs: No results found for requested labs  within last 8760 hours.   Recent Lipid Panel No results found for: CHOL, TRIG, HDL, CHOLHDL, LDLCALC, LDLDIRECT  Wt Readings from Last 3 Encounters:  01/07/19 141 lb 1.6 oz (64 kg)  01/05/18 143 lb 3.2 oz (65 kg)  12/02/16 147 lb (66.7 kg)     Exam:    Vital Signs:  BP (!) 174/92   Pulse 61   Temp 98.5 F (36.9 C)   Ht 5\' 5"  (1.651 m)   Wt 141 lb 1.6 oz (64 kg)   BMI 23.48 kg/m    General appearance: alert and no distress Lungs: no audible breathing difficulty Extremities: extremities normal, atraumatic, no cyanosis or edema Skin: Skin color, texture, turgor normal. No rashes or lesions Neurologic: Mental status: Alert, oriented, thought content appropriate Psych: Pleasant  ASSESSMENT & PLAN:    1. Elevated blood pressure-possible new hypertension 2. History of PVCs 3. Remote history of TIA  Mrs. Adrienne Lopez has an elevated blood pressure reading today which may represent new hypertension.  She has a history of PVCs which are well controlled on atenolol.  There is remote history of TIA.  Last office blood pressure reading was normal.  I would like her to continue to measure blood pressures twice daily for the next week and we will review those numbers.  She may need additional treatment for blood pressure.  COVID-19 Education: The signs and symptoms of COVID-19 were discussed with the patient and how to seek care for testing (follow up with PCP or arrange E-visit).  The importance of social distancing was discussed today.  Patient Risk:   After full review of this patients clinical status, I feel that they are at least moderate risk at this time.  Time:   Today, I have spent 25 minutes with the patient with telehealth technology discussing hypertension, PVC's, BP monitoring.     Medication Adjustments/Labs and Tests Ordered: Current medicines are reviewed at length with the patient today.  Concerns regarding medicines are outlined above.   Tests Ordered: No orders of the  defined types were placed in this encounter.   Medication Changes: No orders of the defined types were placed in this encounter.   Disposition:  in 3 month(s)  Pixie Casino, MD, [facility], [place] Director of the Advanced Lipid Disorders &  Cardiovascular Risk Reduction Clinic Diplomate of the [employer] Board of Clinical Lipidology Attending Cardiologist  Direct Dial: [phone]  Fax: [phone]  Website:  [email or web address]  Pixie Casino, MD  01/07/2019 8:54 AM

## 2019-01-14 NOTE — Telephone Encounter (Signed)
Routed to MD to review BP readings

## 2019-01-21 ENCOUNTER — Other Ambulatory Visit: Payer: Self-pay

## 2019-01-21 MED ORDER — ATENOLOL 25 MG PO TABS: 25.0000 mg | ORAL_TABLET | Freq: Two times a day (BID) | ORAL | 3 refills | Status: DC

## 2019-01-21 NOTE — Telephone Encounter (Signed)
Refill sent as requested. 

## 2019-03-08 ENCOUNTER — Other Ambulatory Visit: Payer: Self-pay | Admitting: Internal Medicine

## 2019-03-12 ENCOUNTER — Other Ambulatory Visit: Payer: Self-pay | Admitting: Internal Medicine

## 2019-03-14 MED ORDER — ATENOLOL 25 MG PO TABS: 25.0000 mg | ORAL_TABLET | Freq: Two times a day (BID) | ORAL | 0 refills | Status: DC

## 2019-03-16 ENCOUNTER — Encounter: Payer: Self-pay | Admitting: Internal Medicine

## 2019-03-16 DIAGNOSIS — R7309 Other abnormal glucose: Secondary | ICD-10-CM

## 2019-03-16 DIAGNOSIS — Z0000 Encounter for general adult medical examination without abnormal findings: Secondary | ICD-10-CM

## 2019-03-16 DIAGNOSIS — E210 Primary hyperparathyroidism: Secondary | ICD-10-CM

## 2019-03-16 DIAGNOSIS — E782 Mixed hyperlipidemia: Secondary | ICD-10-CM

## 2019-03-21 ENCOUNTER — Encounter: Payer: Self-pay | Admitting: Internal Medicine

## 2019-03-23 ENCOUNTER — Other Ambulatory Visit: Payer: Self-pay

## 2019-03-23 ENCOUNTER — Other Ambulatory Visit: Payer: BC Managed Care – PPO

## 2019-03-23 DIAGNOSIS — E782 Mixed hyperlipidemia: Secondary | ICD-10-CM | POA: Diagnosis not present

## 2019-03-23 DIAGNOSIS — Z0000 Encounter for general adult medical examination without abnormal findings: Secondary | ICD-10-CM

## 2019-03-23 DIAGNOSIS — R7309 Other abnormal glucose: Secondary | ICD-10-CM

## 2019-03-23 DIAGNOSIS — E210 Primary hyperparathyroidism: Secondary | ICD-10-CM | POA: Diagnosis not present

## 2019-03-24 ENCOUNTER — Other Ambulatory Visit: Payer: Self-pay | Admitting: Internal Medicine

## 2019-03-24 DIAGNOSIS — Z139 Encounter for screening, unspecified: Secondary | ICD-10-CM

## 2019-03-24 LAB — HEMOGLOBIN A1C
Est. average glucose Bld gHb Est-mCnc: 114 mg/dL
Hgb A1c MFr Bld: 5.6 % (ref 4.8–5.6)

## 2019-03-24 LAB — CMP14 + ANION GAP
ALT: 23 IU/L (ref 0–32)
AST: 21 IU/L (ref 0–40)
Albumin/Globulin Ratio: 1.4 (ref 1.2–2.2)
Albumin: 4.4 g/dL (ref 3.8–4.8)
Alkaline Phosphatase: 50 IU/L (ref 39–117)
Anion Gap: 14 mmol/L (ref 10.0–18.0)
BUN/Creatinine Ratio: 21 (ref 12–28)
BUN: 22 mg/dL (ref 8–27)
Bilirubin Total: 0.5 mg/dL (ref 0.0–1.2)
CO2: 25 mmol/L (ref 20–29)
Calcium: 9.8 mg/dL (ref 8.7–10.3)
Chloride: 101 mmol/L (ref 96–106)
Creatinine, Ser: 1.07 mg/dL — ABNORMAL HIGH (ref 0.57–1.00)
GFR calc Af Amer: 61 mL/min/{1.73_m2} (ref >59)
GFR calc non Af Amer: 53 mL/min/{1.73_m2} — ABNORMAL LOW (ref >59)
Globulin, Total: 3.2 g/dL (ref 1.5–4.5)
Glucose: 96 mg/dL (ref 65–99)
Potassium: 4 mmol/L (ref 3.5–5.2)
Sodium: 140 mmol/L (ref 134–144)
Total Protein: 7.6 g/dL (ref 6.0–8.5)

## 2019-03-24 LAB — LIPID PANEL
Chol/HDL Ratio: 2.8 ratio (ref 0.0–4.4)
Cholesterol, Total: 212 mg/dL — ABNORMAL HIGH (ref 100–199)
HDL: 75 mg/dL (ref >39)
LDL Calculated: 116 mg/dL — ABNORMAL HIGH (ref 0–99)
Triglycerides: 106 mg/dL (ref 0–149)
VLDL Cholesterol Cal: 21 mg/dL (ref 5–40)

## 2019-03-24 LAB — T4, FREE: Free T4: 1.17 ng/dL (ref 0.82–1.77)

## 2019-03-24 LAB — CBC
Hematocrit: 43.9 % (ref 34.0–46.6)
Hemoglobin: 14.6 g/dL (ref 11.1–15.9)
MCH: 30.5 pg (ref 26.6–33.0)
MCHC: 33.3 g/dL (ref 31.5–35.7)
MCV: 92 fL (ref 79–97)
Platelets: 152 10*3/uL (ref 150–450)
RBC: 4.78 x10E6/uL (ref 3.77–5.28)
RDW: 13.9 % (ref 11.7–15.4)
WBC: 4.6 10*3/uL (ref 3.4–10.8)

## 2019-03-24 LAB — T3, FREE: T3, Free: 2.8 pg/mL (ref 2.0–4.4)

## 2019-03-24 LAB — TSH: TSH: 3.52 u[IU]/mL (ref 0.450–4.500)

## 2019-03-24 NOTE — Progress Notes (Signed)
Blood type ordered

## 2019-03-31 ENCOUNTER — Ambulatory Visit (INDEPENDENT_AMBULATORY_CARE_PROVIDER_SITE_OTHER): Payer: BC Managed Care – PPO | Admitting: Internal Medicine

## 2019-03-31 ENCOUNTER — Encounter: Payer: Self-pay | Admitting: Internal Medicine

## 2019-03-31 ENCOUNTER — Other Ambulatory Visit: Payer: Self-pay

## 2019-03-31 VITALS — BP 152/80 | HR 70 | Temp 98.3°F | Ht 63.2 in | Wt 144.4 lb

## 2019-03-31 DIAGNOSIS — L989 Disorder of the skin and subcutaneous tissue, unspecified: Secondary | ICD-10-CM

## 2019-03-31 DIAGNOSIS — Z0000 Encounter for general adult medical examination without abnormal findings: Secondary | ICD-10-CM

## 2019-03-31 DIAGNOSIS — H2589 Other age-related cataract: Secondary | ICD-10-CM

## 2019-03-31 DIAGNOSIS — R030 Elevated blood-pressure reading, without diagnosis of hypertension: Secondary | ICD-10-CM

## 2019-03-31 DIAGNOSIS — Z8679 Personal history of other diseases of the circulatory system: Secondary | ICD-10-CM

## 2019-03-31 LAB — POCT URINALYSIS DIPSTICK
Bilirubin, UA: NEGATIVE
Blood, UA: NEGATIVE
Glucose, UA: NEGATIVE
Ketones, UA: NEGATIVE
Leukocytes, UA: NEGATIVE
Nitrite, UA: NEGATIVE
Protein, UA: NEGATIVE
Spec Grav, UA: 1.01 (ref 1.010–1.025)
Urobilinogen, UA: 0.2 E.U./dL
pH, UA: 7 (ref 5.0–8.0)

## 2019-03-31 LAB — POCT UA - MICROALBUMIN
Albumin/Creatinine Ratio, Urine, POC: <30
Creatinine, POC: 100 mg/dL
Microalbumin Ur, POC: 10 mg/L

## 2019-03-31 NOTE — Progress Notes (Addendum)
Subjective:     Patient ID: Adrienne Lopez , female    DOB: [birth year] , 68 y.o.   MRN: [id number]   Chief Complaint  Patient presents with  . Annual Exam    HPI Pt is here for annual exam.  L upper lower leg mole which she has had for years, and noticed itching and indurated x 2 month, and it is not painful, red or hot. Had virtual cardiology visit 12/2018, has face to face scheduled next month.    Past Medical History:  Diagnosis Date  . Cancer [facility]) 1999   breast, left  . Hyperparathyroidism, primary ([place])   . Osteoporosis   . TIA (transient ischemic attack) 12/26/2000   NO RESIDUAL PROBLEM     Family History  Problem Relation Age of Onset  . Hypertension Mother   . Prostate cancer Father   . Heart attack Maternal Grandmother   . Cancer Maternal Grandfather        oral cancer     Current Outpatient Medications:  .  Ascorbic Acid (VITAMIN C) 1000 MG tablet, Take 1,000 mg by mouth daily., Disp: , Rfl:  .  aspirin 81 MG tablet, Take 81 mg by mouth daily., Disp: , Rfl:  .  atenolol (TENORMIN) 25 MG tablet, Take 1 tablet (25 mg total) by mouth 2 (two) times daily., Disp: 180 tablet, Rfl: 0 .  B Complex-C (B-COMPLEX WITH VITAMIN C) tablet, Take 1 tablet by mouth daily. 100mg , Disp: , Rfl:  .  BIOTIN 5000 PO, Take 1 tablet by mouth daily. , Disp: , Rfl:  .  cholecalciferol (VITAMIN D) 1000 UNITS tablet, Take 5,000 Units by mouth daily. , Disp: , Rfl:  .  Cyanocobalamin (VITAMIN B-12 PO), Take 2,500 mg by mouth daily., Disp: , Rfl:  .  Multiple Vitamin (MULTIVITAMIN) capsule, Take 1 capsule by mouth daily., Disp: , Rfl:  .  vitamin E (VITAMIN E) 400 UNIT capsule, Take 400 Units by mouth daily., Disp: , Rfl:    No Known Allergies   Review of Systems  Sometimes gets stufy ears, and is mild today. Has hx of wax built. The rest is negative.  Today's Vitals   03/31/19 1022  BP: (!) 150/82  Pulse: 70  Temp: 98.3 F (36.8 C)  TempSrc: Oral  SpO2: 98%  Weight: 144 lb 6.4 oz  (65.5 kg)  Height: 5' 3.2" (1.605 m)   Body mass index is 25.42 kg/m.   Objective:  Physical Exam  BP (!) 150/82 (BP Location: Right Arm, Patient Position: Sitting, Cuff Size: Normal)   Pulse 70   Temp 98.3 F (36.8 C) (Oral)   Ht 5' 3.2" (1.605 m)   Wt 144 lb 6.4 oz (65.5 kg)   SpO2 98%   BMI 25.42 kg/m   General Appearance:    Alert, cooperative, no distress, appears stated age  Head:    Normocephalic, without obvious abnormality, atraumatic  Eyes:    PERRL, conjunctiva/corneas clear, EOM's intact, fundi    benign, both eyes  Ears:    Normal TM's and external ear canals, both ears  Nose:   Nares normal, septum midline, mucosa normal, no drainage    or sinus tenderness  Throat:   Lips, mucosa, and tongue normal; teeth and gums normal  Neck:   Supple, symmetrical, trachea midline, no adenopathy;    thyroid:  no enlargement/tenderness/nodules; no carotid   bruit or JVD  Back:     Asymmetric, R mid thorax muscular region is very prominent,  but I dont noticed curvature of her spine. This area is non tender. ROM normal.  Lungs:     Clear to auscultation bilaterally, respirations unlabored  Chest Wall:    No tenderness or deformity   Heart:    Regular rate and rhythm, S1 and S2 normal, no murmur, rub   or gallop  EKGBreast Exam:    No tenderness, masses, or nipple abnormality  Abdomen:     Soft, non-tender, bowel sounds active all four quadrants,    no masses, no organomegaly     Rectal:    declined  Extremities:   Extremities normal, atraumatic, no cyanosis or edema  Pulses:   2+ and symmetric all extremities  Skin:   Skin color, texture, turgor normal, no rashes or lesions  Lymph nodes:   Cervical, supraclavicular, and axillary nodes normal  Neurologic:   CNII-XII intact, normal strength, sensation and reflexes    throughout  EKG- shows sinus brady, R atrial enlargement. Borderline. No old EKG present in epic for me to compare. I will send this to Dr Debara Pickett  UA- neg. Urine  microalbumin- neg  Assessment And Plan:     1. Other age-related cataract of both eyes- mild. No action.  2. Skin lesion of left leg- itching x 2 months.   - Ambulatory referral to Dermatology  3. Routine medical exam- routine. FU 1 y       4. Elevated blood pressure reading    - POCT Urinalysis Dipstick (81002) - POCT UA - Microalbumin - EKG 12-Lead Advised to do BP diaries and take them to her cardiologist.  5- Hx of palpitations- will continue care with cardiologist.   [name], PA-C    THE PATIENT IS ENCOURAGED TO PRACTICE SOCIAL DISTANCING DUE TO THE COVID-19 PANDEMIC.

## 2019-03-31 NOTE — Patient Instructions (Signed)
Do blood pressure diaries and if top number  Is more than 140/80, send me those results as well as to your cardiologist.

## 2019-04-29 ENCOUNTER — Telehealth: Payer: BLUE CROSS/BLUE SHIELD | Admitting: Internal Medicine

## 2019-04-29 DIAGNOSIS — D239 Other benign neoplasm of skin, unspecified: Secondary | ICD-10-CM | POA: Diagnosis not present

## 2019-05-03 ENCOUNTER — Ambulatory Visit: Payer: BC Managed Care – PPO | Admitting: Internal Medicine

## 2019-05-03 ENCOUNTER — Encounter: Payer: Self-pay | Admitting: Internal Medicine

## 2019-05-03 ENCOUNTER — Other Ambulatory Visit: Payer: Self-pay

## 2019-05-03 VITALS — BP 158/82 | HR 82 | Ht 63.2 in | Wt 141.0 lb

## 2019-05-03 DIAGNOSIS — R030 Elevated blood-pressure reading, without diagnosis of hypertension: Secondary | ICD-10-CM | POA: Diagnosis not present

## 2019-05-03 DIAGNOSIS — Z79899 Other long term (current) drug therapy: Secondary | ICD-10-CM | POA: Diagnosis not present

## 2019-05-03 MED ORDER — CHLORTHALIDONE 25 MG PO TABS: 12.5000 mg | ORAL_TABLET | Freq: Every day | ORAL | 5 refills | Status: DC

## 2019-05-03 NOTE — Patient Instructions (Addendum)
Medication Instructions:  START chlorthalidone 12.5mg  daily (half of 25mg  tablet) - take in AM Continue other current medications  If you need a refill on your cardiac medications before your next appointment, please call your pharmacy.   Lab work: BMET in one week If you have labs (blood work) drawn today and your tests are completely normal, you will receive your results only by: Marland Kitchen MyChart Message (if you have MyChart) OR . A paper copy in the mail If you have any lab test that is abnormal or we need to change your treatment, we will call you to review the results.   Follow-Up: At [facility], you and your health needs are our priority.  As part of our continuing mission to provide you with exceptional heart care, we have created designated Provider Care Teams.  These Care Teams include your primary Cardiologist (physician) and Advanced Practice Providers (APPs -  Physician Assistants and Nurse Practitioners) who all work together to provide you with the care you need, when you need it. . You will need a follow up appointment in 1 month with Dr. Debara Pickett - OK to double book - in office appointment  Please check BP at home twice daily for 1 week and then once daily thereafter.

## 2019-05-03 NOTE — Progress Notes (Signed)
OFFICE NOTE  Chief Complaint:  Follow-up hypertension  Primary Care Physician: Shelby Mattocks, PA-C  HPI:  Adrienne Lopez is a pleasant 68 year old female from [place]. She was noted to be hypercalcemic and ultimately diagnosed with hyperparathyroidism. She underwent parathyroid surgery by Dr. Harlow Asa in September. She did well since then however subsequently developed palpitations. She describes the palpitations as a hard thumping in her chest and neck were occurring several times a day after surgery. The symptoms have improved somewhat and it seems to be coincidental with her taking aspirin.  She does have a history of TIA in the past, but the episode was unclear as she "blacked out" when she was taking medication for breast cancer, specifically tamoxifen. She was taken off of that at that time. She did have a history of breast cancer and underwent chest wall radiation in 1999 but has been free of recurrence. Otherwise she has no other medical history and is currently not on any medications other than aspirin. He recently had blood work through Dr. Saul Fordyce office which indicates her thyroid function is low normal but her parathyroid is within normal limits and her calcium is now 9.3. She reports her palpitations occur a couple times a day and feel like a strong beating of the heart that causes her some mild shortness of breath. She denies any chest pain or anginal symptoms. There is no significant family history of heart disease.   At her last visit I recommended an echocardiogram and 48 hour monitor. She did wear the monitor for 48 hours which reported actually a number of both ventricular and atrial ectopic beats. Infectious thousand and 26 bigeminal ventricular beats. There was no significant nonsustained VT but there was some sustained supraventricular tachycardia.  She underwent an echocardiogram which was essentially normal except for some mitral regurgitation, but no wall  motion abnormalities and preserved EF of 55-60%.  Subsequent to this, she had an episode of chest pain which awoke her up at night on November 7. She went to the emergency department with symptoms of chest tightness and left arm pain. The symptoms resolved fairly quickly and she ruled out for MI. She did report some palpitations associated with this event.  In addition to her parathyroid problems, there is a question about thyroid abnormality as well. She is currently undergoing testing for this. She says all of these symptoms have come on since her surgery for the parathyroid glands.  Adrienne Lopez returns today and reports feeling well. She continues to have infrequent palpitations, despite atenolol.  She denies any chest pain or worsening shortness of breath. She's had no further TIA events. She's been compliant with daily aspirin.  12/02/2016  Adrienne Lopez returns for follow-up. Over the past year she has done well without complaints. She denies any palpitations. She followed up with Dr. Chalmers Cater and was noted to have elevated blood sugars - she was told to lose 10 pounds and she was able to successfully. BP was mildly elevated today, but came down to 134/86 after a re-check.  01/05/2018  Adrienne Lopez was seen today in follow-up.  Again she continues to do well.  She denies chest pain or worsening shortness of breath.  She has no palpitations.  Labs from July 2018 showed total cholesterol 195, HDL 66, LDL 111 and triglycerides 92.  Serum creatinine of 1.07.  Blood pressures been stable.  EKG today shows sinus bradycardia at 53.  05/03/2019  Adrienne Lopez is seen today for follow-up of  hypertension.  Recently I did a telemedicine visit with her and her blood pressure was elevated.  I asked her to take some home blood pressure readings and submit that to me.  This was done in late May and early June.  Her blood pressures averaged between [id number] and [id number] systolic in the morning and generally between 115-120 2 in the  evening.  We had readjusted her medications to 50 mg of atenolol once a day in the morning.  Overall though I am not convinced that we have had very good control of her morning blood pressures.  PMHx:  Past Medical History:  Diagnosis Date  . Cancer [facility]) 1999   breast, left  . Hyperparathyroidism, primary ([place])   . Osteoporosis   . TIA (transient ischemic attack) 12/26/2000   NO RESIDUAL PROBLEM    Past Surgical History:  Procedure Laterality Date  . ABDOMINAL HYSTERECTOMY    . BREAST LUMPECTOMY  1999   left  . PARATHYROIDECTOMY N/A 05/06/2013   Procedure: PARATHYROIDECTOMY;  Surgeon: Earnstine Regal, MD;  Location: [facility];  Service: General;  Laterality: N/A;  . PARTIAL HYSTERECTOMY  1991    FAMHx:  Family History  Problem Relation Age of Onset  . Hypertension Mother   . Prostate cancer Father   . Heart attack Maternal Grandmother   . Cancer Maternal Grandfather        oral cancer    SOCHx:   reports that she has never smoked. She has never used smokeless tobacco. She reports that she does not drink alcohol or use drugs.  ALLERGIES:  No Known Allergies  ROS: Pertinent items noted in HPI and remainder of comprehensive ROS otherwise negative.  HOME MEDS: Current Outpatient Medications  Medication Sig Dispense Refill  . Ascorbic Acid (VITAMIN C) 1000 MG tablet Take 1,000 mg by mouth daily.    Marland Kitchen aspirin 81 MG tablet Take 81 mg by mouth daily.    Marland Kitchen atenolol (TENORMIN) 25 MG tablet Take 50 mg by mouth every morning.    . B Complex-C (B-COMPLEX WITH VITAMIN C) tablet Take 1 tablet by mouth daily. 100mg     . BIOTIN 5000 PO Take 1 tablet by mouth daily.     . cholecalciferol (VITAMIN D) 1000 UNITS tablet Take 5,000 Units by mouth daily.     . Cyanocobalamin (VITAMIN B-12 PO) Take 2,500 mg by mouth daily.    . Multiple Vitamin (MULTIVITAMIN) capsule Take 1 capsule by mouth daily.    . vitamin E (VITAMIN E) 400 UNIT capsule Take 400 Units by mouth daily.    . chlorthalidone  (HYGROTON) 25 MG tablet Take 0.5 tablets (12.5 mg total) by mouth daily. 15 tablet 5   No current facility-administered medications for this visit.     LABS/IMAGING: No results found for this or any previous visit (from the past 48 hour(s)). No results found.  VITALS: BP (!) 158/82   Pulse 82   Ht 5' 3.2" (1.605 m)   Wt 141 lb (64 kg)   BMI 24.82 kg/m   EXAM: General appearance: alert and no distress Neck: no carotid bruit, no JVD and thyroid not enlarged, symmetric, no tenderness/mass/nodules Lungs: clear to auscultation bilaterally Heart: regular rate and rhythm, S1, S2 normal, no murmur, click, rub or gallop Abdomen: soft, non-tender; bowel sounds normal; no masses,  no organomegaly Extremities: extremities normal, atraumatic, no cyanosis or edema Pulses: 2+ and symmetric Skin: Skin color, texture, turgor normal. No rashes or lesions Neurologic: Grossly normal Psych: Pleasant  EKG: Deferred  ASSESSMENT: 1. Chest pain-resolved, low risk NST on 08/04/2013 2. Continued palpitations - PAC's and PVC;s with bigeminy 3. Recent hyperparathyroidism status post surgery 4. History of TIA 5. History of Breast CA s/p chemotherapy and chest wall radiation 6. Central hypertension  PLAN: 1.   Adrienne Lopez still has somewhat elevated blood pressures in my opinion.  In the office today she was again 158/82.  Her home blood pressures are generally more like [id number] systolic.  I think she needs a little extra blood pressure medication and after discussing it with our pharmacists, I would recommend adding 12.5 mg chlorthalidone daily.  We will recheck a metabolic profile in a week.  If this is well-tolerated ultimately she could go onto a combination of atenolol/chlorthalidone.  She would have to take the 100/25 mg pill and cut it in half once daily.  We could discuss this further.  She will monitor blood pressures and will plan to see her back in a month.  Pixie Casino, MD, [facility], [place] Director of the Advanced Lipid Disorders &  Cardiovascular Risk Reduction Clinic Diplomate of the American Board of Clinical Lipidology Attending Cardiologist  Direct Dial: [phone]  Fax: [phone]  Website:  [email or web address] 05/03/2019, 4:54 PM

## 2019-05-16 DIAGNOSIS — Z79899 Other long term (current) drug therapy: Secondary | ICD-10-CM | POA: Diagnosis not present

## 2019-05-17 LAB — BASIC METABOLIC PANEL
BUN/Creatinine Ratio: 23 (ref 12–28)
BUN: 25 mg/dL (ref 8–27)
CO2: 22 mmol/L (ref 20–29)
Calcium: 9.8 mg/dL (ref 8.7–10.3)
Chloride: 101 mmol/L (ref 96–106)
Creatinine, Ser: 1.08 mg/dL — ABNORMAL HIGH (ref 0.57–1.00)
GFR calc Af Amer: 60 mL/min/{1.73_m2} (ref >59)
GFR calc non Af Amer: 52 mL/min/{1.73_m2} — ABNORMAL LOW (ref >59)
Glucose: 87 mg/dL (ref 65–99)
Potassium: 4.2 mmol/L (ref 3.5–5.2)
Sodium: 141 mmol/L (ref 134–144)

## 2019-05-30 MED ORDER — ATENOLOL 50 MG PO TABS: 50.0000 mg | ORAL_TABLET | Freq: Every day | ORAL | 3 refills | Status: DC

## 2019-06-02 ENCOUNTER — Other Ambulatory Visit: Payer: Self-pay

## 2019-06-02 ENCOUNTER — Ambulatory Visit: Payer: BC Managed Care – PPO | Admitting: Internal Medicine

## 2019-06-02 ENCOUNTER — Encounter: Payer: Self-pay | Admitting: Internal Medicine

## 2019-06-02 VITALS — BP 154/87 | HR 54 | Temp 97.3°F | Ht 63.0 in | Wt 140.0 lb

## 2019-06-02 DIAGNOSIS — I10 Essential (primary) hypertension: Secondary | ICD-10-CM

## 2019-06-02 DIAGNOSIS — I493 Ventricular premature depolarization: Secondary | ICD-10-CM

## 2019-06-02 MED ORDER — ATENOLOL-CHLORTHALIDONE 100-25 MG PO TABS: 0.5000 | ORAL_TABLET | Freq: Every day | ORAL | 3 refills | Status: DC

## 2019-06-02 NOTE — Patient Instructions (Signed)
Medication Instructions:  STOP separate atenolol + chlorthalidone START combination pill atenolol 100/chlorthalidone 25mg  and take HALF TABLET daily   If you need a refill on your cardiac medications before your next appointment, please call your pharmacy.    Follow-Up: At [facility], you and your health needs are our priority.  As part of our continuing mission to provide you with exceptional heart care, we have created designated Provider Care Teams.  These Care Teams include your primary Cardiologist (physician) and Advanced Practice Providers (APPs -  Physician Assistants and Nurse Practitioners) who all work together to provide you with the care you need, when you need it. You will need a follow up appointment in 6 months.  Please call our office 2 months in advance to schedule this appointment.  You may see Dr. Debara Pickett or one of the following Advanced Practice Providers on your designated Care Team: Almyra Deforest, [place] . Fabian Sharp, PA-C  Any Other Special Instructions Will Be Listed Below (If Applicable).

## 2019-06-02 NOTE — Progress Notes (Signed)
OFFICE NOTE  Chief Complaint:  Follow-up hypertension  Primary Care Physician: Shelby Mattocks, PA-C  HPI:  Adrienne Lopez is a pleasant 68 year old female from [place]. She was noted to be hypercalcemic and ultimately diagnosed with hyperparathyroidism. She underwent parathyroid surgery by Dr. Harlow Asa in September. She did well since then however subsequently developed palpitations. She describes the palpitations as a hard thumping in her chest and neck were occurring several times a day after surgery. The symptoms have improved somewhat and it seems to be coincidental with her taking aspirin.  She does have a history of TIA in the past, but the episode was unclear as she "blacked out" when she was taking medication for breast cancer, specifically tamoxifen. She was taken off of that at that time. She did have a history of breast cancer and underwent chest wall radiation in 1999 but has been free of recurrence. Otherwise she has no other medical history and is currently not on any medications other than aspirin. He recently had blood work through Dr. Saul Fordyce office which indicates her thyroid function is low normal but her parathyroid is within normal limits and her calcium is now 9.3. She reports her palpitations occur a couple times a day and feel like a strong beating of the heart that causes her some mild shortness of breath. She denies any chest pain or anginal symptoms. There is no significant family history of heart disease.   At her last visit I recommended an echocardiogram and 48 hour monitor. She did wear the monitor for 48 hours which reported actually a number of both ventricular and atrial ectopic beats. Infectious thousand and 26 bigeminal ventricular beats. There was no significant nonsustained VT but there was some sustained supraventricular tachycardia.  She underwent an echocardiogram which was essentially normal except for some mitral regurgitation, but no wall  motion abnormalities and preserved EF of 55-60%.  Subsequent to this, she had an episode of chest pain which awoke her up at night on November 7. She went to the emergency department with symptoms of chest tightness and left arm pain. The symptoms resolved fairly quickly and she ruled out for MI. She did report some palpitations associated with this event.  In addition to her parathyroid problems, there is a question about thyroid abnormality as well. She is currently undergoing testing for this. She says all of these symptoms have come on since her surgery for the parathyroid glands.  Mrs. Adrienne Lopez returns today and reports feeling well. She continues to have infrequent palpitations, despite atenolol.  She denies any chest pain or worsening shortness of breath. She's had no further TIA events. She's been compliant with daily aspirin.  12/02/2016  Mrs. Adrienne Lopez returns for follow-up. Over the past year she has done well without complaints. She denies any palpitations. She followed up with Dr. Chalmers Cater and was noted to have elevated blood sugars - she was told to lose 10 pounds and she was able to successfully. BP was mildly elevated today, but came down to 134/86 after a re-check.  01/05/2018  [name] was seen today in follow-up.  Again she continues to do well.  She denies chest pain or worsening shortness of breath.  She has no palpitations.  Labs from July 2018 showed total cholesterol 195, HDL 66, LDL 111 and triglycerides 92.  Serum creatinine of 1.07.  Blood pressures been stable.  EKG today shows sinus bradycardia at 53.  05/03/2019  Mrs. Adrienne Lopez is seen today for follow-up of  hypertension.  Recently I did a telemedicine visit with her and her blood pressure was elevated.  I asked her to take some home blood pressure readings and submit that to me.  This was done in late May and early June.  Her blood pressures averaged between [id number] and [id number] systolic in the morning and generally between 115-120 2 in the  evening.  We had readjusted her medications to 50 mg of atenolol once a day in the morning.  Overall though I am not convinced that we have had very good control of her morning blood pressures.  06/02/2019  Mrs. Adrienne Lopez returns today for follow-up of hypertension.  After further adjusting her medication it seems that we have now improved her blood pressures adequately.  She is currently taking atenolol 50 mg in the morning and chlorthalidone 12.5 mg daily.  She says this medicine has been difficult to cut up and oftentimes pulverize is the pill.  We discussed the possibility of taking the higher dose atenolol 100 mg / 25 mg combination with chlorthalidone and breaking it in half since the tablet is scored.  PMHx:  Past Medical History:  Diagnosis Date  . Cancer [facility]) 1999   breast, left  . Hyperparathyroidism, primary ([place])   . Osteoporosis   . TIA (transient ischemic attack) 12/26/2000   NO RESIDUAL PROBLEM    Past Surgical History:  Procedure Laterality Date  . ABDOMINAL HYSTERECTOMY    . BREAST LUMPECTOMY  1999   left  . PARATHYROIDECTOMY N/A 05/06/2013   Procedure: PARATHYROIDECTOMY;  Surgeon: Earnstine Regal, MD;  Location: [facility];  Service: General;  Laterality: N/A;  . PARTIAL HYSTERECTOMY  1991    FAMHx:  Family History  Problem Relation Age of Onset  . Hypertension Mother   . Prostate cancer Father   . Heart attack Maternal Grandmother   . Cancer Maternal Grandfather        oral cancer    SOCHx:   reports that she has never smoked. She has never used smokeless tobacco. She reports that she does not drink alcohol or use drugs.  ALLERGIES:  No Known Allergies  ROS: Pertinent items noted in HPI and remainder of comprehensive ROS otherwise negative.  HOME MEDS: Current Outpatient Medications  Medication Sig Dispense Refill  . Ascorbic Acid (VITAMIN C) 1000 MG tablet Take 1,000 mg by mouth daily.    Marland Kitchen aspirin 81 MG tablet Take 81 mg by mouth daily.    . B Complex-C  (B-COMPLEX WITH VITAMIN C) tablet Take 1 tablet by mouth daily. 100mg     . BIOTIN 5000 PO Take 1 tablet by mouth daily.     . cholecalciferol (VITAMIN D) 1000 UNITS tablet Take 5,000 Units by mouth daily.     . Cyanocobalamin (VITAMIN B-12 PO) Take 2,500 mg by mouth daily.    . Multiple Vitamin (MULTIVITAMIN) capsule Take 1 capsule by mouth daily.    . vitamin E (VITAMIN E) 400 UNIT capsule Take 400 Units by mouth daily.    Marland Kitchen atenolol-chlorthalidone (TENORETIC) 100-25 MG tablet Take 0.5 tablets by mouth daily. 45 tablet 3   No current facility-administered medications for this visit.     LABS/IMAGING: No results found for this or any previous visit (from the past 48 hour(s)). No results found.  VITALS: BP (!) 154/87   Pulse (!) 54   Temp (!) 97.3 F (36.3 C) (Temporal)   Ht 5\' 3"  (1.6 m)   Wt 140 lb (63.5 kg)   BMI  24.80 kg/m   EXAM: Deferred  EKG: Deferred  ASSESSMENT: 1. Chest pain-resolved, low risk NST on 08/04/2013 2. Continued palpitations - PAC's and PVC;s with bigeminy 3. Recent hyperparathyroidism status post surgery 4. History of TIA 5. History of Breast CA s/p chemotherapy and chest wall radiation 6. Essential hypertension  PLAN: 1.   Ms. Kampman now has better control of her blood pressures.  We will switch her to combination atenolol/chlorthalidone 100/25 mg (1/2 tablet) daily.  She should remain on this and will plan follow-up in 6 months or sooner as necessary.  Pixie Casino, MD, [facility], [place] Director of the Advanced Lipid Disorders &  Cardiovascular Risk Reduction Clinic Diplomate of the American Board of Clinical Lipidology Attending Cardiologist  Direct Dial: [phone]  Fax: [phone]  Website:  [email or web address] 06/02/2019, 10:02 PM

## 2019-08-12 DIAGNOSIS — D2372 Other benign neoplasm of skin of left lower limb, including hip: Secondary | ICD-10-CM | POA: Diagnosis not present

## 2019-08-12 DIAGNOSIS — D485 Neoplasm of uncertain behavior of skin: Secondary | ICD-10-CM | POA: Diagnosis not present

## 2019-08-30 DIAGNOSIS — Z1231 Encounter for screening mammogram for malignant neoplasm of breast: Secondary | ICD-10-CM | POA: Diagnosis not present

## 2019-08-30 DIAGNOSIS — Z853 Personal history of malignant neoplasm of breast: Secondary | ICD-10-CM | POA: Diagnosis not present

## 2019-08-30 LAB — HM MAMMOGRAPHY

## 2019-09-06 ENCOUNTER — Encounter: Payer: Self-pay | Admitting: Internal Medicine

## 2019-09-25 ENCOUNTER — Encounter: Payer: Self-pay | Admitting: Internal Medicine

## 2019-10-06 ENCOUNTER — Ambulatory Visit: Payer: BC Managed Care – PPO | Admitting: Internal Medicine

## 2019-10-31 ENCOUNTER — Ambulatory Visit: Payer: Medicare (Managed Care) | Attending: Internal Medicine

## 2019-10-31 DIAGNOSIS — Z23 Encounter for immunization: Secondary | ICD-10-CM | POA: Insufficient documentation

## 2019-10-31 NOTE — Progress Notes (Signed)
   Covid-19 Vaccination Clinic  Name:  Allyshia Bibeault    MRN: [id number] DOB: 1951-02-12  10/31/2019  Ms. Maran was observed post Covid-19 immunization for 15 minutes without incidence. She was provided with Vaccine Information Sheet and instruction to access the V-Safe system.   Ms. Ivery was instructed to call 911 with any severe reactions post vaccine: Marland Kitchen Difficulty breathing  . Swelling of your face and throat  . A fast heartbeat  . A bad rash all over your body  . Dizziness and weakness    Immunizations Administered    Name Date Dose VIS Date Route   Pfizer COVID-19 Vaccine 10/31/2019  9:45 AM 0.3 mL 08/12/2019 Intramuscular   Manufacturer: [place]   Lot: [id number]   [place]: [id number]

## 2019-11-29 ENCOUNTER — Ambulatory Visit: Payer: Medicare (Managed Care) | Attending: Internal Medicine

## 2019-11-29 DIAGNOSIS — Z23 Encounter for immunization: Secondary | ICD-10-CM

## 2019-11-29 NOTE — Progress Notes (Signed)
   Covid-19 Vaccination Clinic  Name:  Adrienne Lopez    MRN: [id number] DOB: Oct 15, 1950  11/29/2019  [name] was observed post Covid-19 immunization for 15 minutes without incident. She was provided with Vaccine Information Sheet and instruction to access the V-Safe system.   [name] was instructed to call 911 with any severe reactions post vaccine: Marland Kitchen Difficulty breathing  . Swelling of face and throat  . A fast heartbeat  . A bad rash all over body  . Dizziness and weakness   Immunizations Administered    Name Date Dose VIS Date Route   Pfizer COVID-19 Vaccine 11/29/2019  9:45 AM 0.3 mL 08/12/2019 Intramuscular   Manufacturer: [place]   Lot: [id number]   [place]: [id number]

## 2020-04-05 ENCOUNTER — Encounter: Payer: BC Managed Care – PPO | Admitting: Internal Medicine

## 2020-04-17 ENCOUNTER — Encounter: Payer: Self-pay | Admitting: Nurse Practitioner

## 2020-04-18 ENCOUNTER — Ambulatory Visit (INDEPENDENT_AMBULATORY_CARE_PROVIDER_SITE_OTHER): Payer: Medicare (Managed Care) | Admitting: Nurse Practitioner

## 2020-04-18 ENCOUNTER — Other Ambulatory Visit: Payer: Self-pay

## 2020-04-18 VITALS — BP 136/84 | HR 62 | Temp 98.1°F | Ht 64.6 in | Wt 141.8 lb

## 2020-04-18 DIAGNOSIS — E2839 Other primary ovarian failure: Secondary | ICD-10-CM

## 2020-04-18 DIAGNOSIS — Z1159 Encounter for screening for other viral diseases: Secondary | ICD-10-CM | POA: Diagnosis not present

## 2020-04-18 DIAGNOSIS — Z23 Encounter for immunization: Secondary | ICD-10-CM | POA: Diagnosis not present

## 2020-04-18 DIAGNOSIS — Z853 Personal history of malignant neoplasm of breast: Secondary | ICD-10-CM

## 2020-04-18 DIAGNOSIS — Z0000 Encounter for general adult medical examination without abnormal findings: Secondary | ICD-10-CM

## 2020-04-18 DIAGNOSIS — I10 Essential (primary) hypertension: Secondary | ICD-10-CM

## 2020-04-18 DIAGNOSIS — E782 Mixed hyperlipidemia: Secondary | ICD-10-CM

## 2020-04-18 MED ORDER — PNEUMOCOCCAL 13-VAL CONJ VACC IM SUSP: 0.5000 mL | INTRAMUSCULAR | 0 refills | Status: AC

## 2020-04-18 MED ORDER — TETANUS-DIPHTH-ACELL PERTUSSIS 5-2.5-18.5 LF-MCG/0.5 IM SUSP: 0.5000 mL | Freq: Once | INTRAMUSCULAR | 0 refills | Status: AC

## 2020-04-18 NOTE — Progress Notes (Signed)
This visit occurred during the SARS-CoV-2 public health emergency.  Safety protocols were in place, including screening questions prior to the visit, additional usage of staff PPE, and extensive cleaning of exam room while observing appropriate contact time as indicated for disinfecting solutions.  Subjective:     Patient ID: Adrienne Lopez , female    DOB: 12-27-1950 , 69 y.o.   MRN: [id number]   Chief Complaint  Patient presents with  . Annual Exam  . Medicare Wellness    HPI  Here for Welcome to Medicare and physical    Lumpectomy left breast 1999, radiation treatment 34 and took Tamoxifen  She sees Dr. Debara Pickett - she is seeing him for her irregular heart beats - atenolol - chlorthalidone    Past Medical History:  Diagnosis Date  . Cancer [facility]) 1999   breast, left  . Hyperparathyroidism, primary ([place])   . Osteoporosis   . TIA (transient ischemic attack) 12/26/2000   NO RESIDUAL PROBLEM     Family History  Problem Relation Age of Onset  . Hypertension Mother   . Prostate cancer Father   . Heart attack Maternal Grandmother   . Cancer Maternal Grandfather        oral cancer     Current Outpatient Medications:  .  Ascorbic Acid (VITAMIN C) 1000 MG tablet, Take 1,000 mg by mouth daily., Disp: , Rfl:  .  aspirin 81 MG tablet, Take 81 mg by mouth daily., Disp: , Rfl:  .  atenolol-chlorthalidone (TENORETIC) 100-25 MG tablet, Take 0.5 tablets by mouth daily., Disp: 45 tablet, Rfl: 3 .  B Complex-C (B-COMPLEX WITH VITAMIN C) tablet, Take 1 tablet by mouth daily. $RemoveBefo'100mg'[id number]$ , Disp: , Rfl:  .  BIOTIN 5000 PO, Take 1 tablet by mouth daily. , Disp: , Rfl:  .  cholecalciferol (VITAMIN D) 1000 UNITS tablet, Take 5,000 Units by mouth daily. , Disp: , Rfl:  .  Cyanocobalamin (VITAMIN B-12 PO), Take 2,500 mg by mouth daily., Disp: , Rfl:  .  Multiple Vitamin (MULTIVITAMIN) capsule, Take 1 capsule by mouth daily., Disp: , Rfl:  .  vitamin E (VITAMIN E) 400 UNIT capsule, Take 400 Units by mouth  daily., Disp: , Rfl:    No Known Allergies   Review of Systems  Constitutional: Negative.   HENT: Negative.   Eyes: Negative.   Respiratory: Negative.   Cardiovascular: Negative.   Gastrointestinal: Negative.   Endocrine: Negative.   Genitourinary: Negative.   Musculoskeletal: Negative.   Skin: Negative.   Allergic/Immunologic: Negative.   Neurological: Negative.   Hematological: Negative.   Psychiatric/Behavioral: Negative.      Today's Vitals   04/18/20 0928  BP: 136/84  Pulse: 62  Temp: 98.1 F (36.7 C)  TempSrc: Oral  Weight: 141 lb 12.8 oz (64.3 kg)  Height: 5' 4.6" (1.641 m)  PainSc: 0-No pain   Body mass index is 23.89 kg/m.   Objective:  Physical Exam Constitutional:      General: She is not in acute distress.    Appearance: Normal appearance. She is well-developed.  HENT:     Head: Normocephalic and atraumatic.     Right Ear: Hearing, tympanic membrane, ear canal and external ear normal. There is no impacted cerumen.     Left Ear: Hearing, tympanic membrane, ear canal and external ear normal. There is no impacted cerumen.     Nose:     Comments: Deferred - masked    Mouth/Throat:     Comments: Deferred - masked Eyes:  General: Lids are normal.     Extraocular Movements: Extraocular movements intact.     Conjunctiva/sclera: Conjunctivae normal.     Pupils: Pupils are equal, round, and reactive to light.     Funduscopic exam:    Right eye: No papilledema.        Left eye: No papilledema.  Neck:     Thyroid: No thyroid mass.     Vascular: No carotid bruit.  Cardiovascular:     Rate and Rhythm: Normal rate and regular rhythm.     Pulses: Normal pulses.     Heart sounds: Normal heart sounds. No murmur heard.   Pulmonary:     Effort: Pulmonary effort is normal. No respiratory distress.     Breath sounds: Normal breath sounds.  Abdominal:     General: Abdomen is flat. Bowel sounds are normal. There is no distension.     Palpations: Abdomen is  soft.     Tenderness: There is no abdominal tenderness.  Genitourinary:    Rectum: Guaiac result negative.  Musculoskeletal:        General: No swelling. Normal range of motion.     Cervical back: Full passive range of motion without pain, normal range of motion and neck supple.     Right lower leg: No edema.     Left lower leg: No edema.  Skin:    General: Skin is warm and dry.     Capillary Refill: Capillary refill takes less than 2 seconds.     Comments: Healed lumpectomy scar left breast  Neurological:     General: No focal deficit present.     Mental Status: She is alert and oriented to person, place, and time.     Cranial Nerves: No cranial nerve deficit.     Sensory: No sensory deficit.  Psychiatric:        Mood and Affect: Mood normal.        Behavior: Behavior normal.        Thought Content: Thought content normal.        Judgment: Judgment normal.         Assessment And Plan:     1. Encounter for health maintenance examination . Behavior modifications discussed and diet history reviewed.   . Pt will continue to exercise regularly and modify diet with low GI, plant based foods and decrease intake of processed foods.  . Recommend intake of daily multivitamin, Vitamin D, and calcium.  . Recommend for preventive screenings, as well as recommend immunizations that include influenza will add to flu list), TDAP sent to pharmacy - CMP14+EGFR - CBC  2. Medicare annual wellness visit, initial  Pt's annual wellness exam was performed and geriatric assessment reviewed.   Pt has no new identiafble wellness concerns at this time.   WIll obtain routine labs.   Will obtain UA and micro.   Behavior modifications discussed and diet history reviewed. Pt will continue to exercise regularly and modify diet, with low GI, plant based foods and decrease food intake of processed foods.   Recommend intake of daily multivitamin, Vitamin D, and calcium. - EKG 12-Lead  3. Encounter  for hepatitis C screening test for low risk patient  Will check Hepatitis C screening due to recent recommendations to screen all adults 18 years and older - Hepatitis C antibody  4. Encounter for immunization  Will give tetanus vaccine today while in office. Refer to order management. TDAP will be administered to adults 63-71 years old every 73  years.  Sent Rx for prevnar 13 to pharmacy - Tdap ([place]) 5-2.5-18.5 LF-MCG/0.5 injection; Inject 0.5 mLs into the muscle once for 1 dose.  Dispense: 0.5 mL; Refill: 0 - pneumococcal 13-valent conjugate vaccine (PREVNAR 13) SUSP injection; Inject 0.5 mLs into the muscle tomorrow at 10 am for 1 dose.  Dispense: 0.5 mL; Refill: 0  5. Decreased estrogen level  Encouraged to take vitamin d and to do walking for bone health - DG Bone Density; Future  6. Mixed hyperlipidemia  Chronic, controlled  No current medications  Avoid fried and fatty foods - Lipid panel  7. Essential hypertension . B/P is fairly controlled.  . CMP ordered to check renal function.  . The importance of regular exercise and dietary modification was stressed to the patient.  . EKG done with sinus rhythm with HR 53  8. History of breast cancer Right lumpectomy   Patient was given opportunity to ask questions. Patient verbalized understanding of the plan and was able to repeat key elements of the plan. All questions were answered to their satisfaction.  [name], FNP   I, [name], FNP, have reviewed all documentation for this visit. The documentation on 04/19/20 for the exam, diagnosis, procedures, and orders are all accurate and complete.  THE PATIENT IS ENCOURAGED TO PRACTICE SOCIAL DISTANCING DUE TO THE COVID-19 PANDEMIC.     Subjective:    Mahnoor Mathisen is a 69 y.o. female who presents for a Welcome to Medicare exam.   Review of Systems        Objective:    Today's Vitals   04/18/20 0928  BP: 136/84  Pulse: 62  Temp: 98.1 F (36.7 C)   TempSrc: Oral  Weight: 141 lb 12.8 oz (64.3 kg)  Height: 5' 4.6" (1.641 m)  PainSc: 0-No pain  Body mass index is 23.89 kg/m.  Medications Outpatient Encounter Medications as of 04/18/2020  Medication Sig  . Ascorbic Acid (VITAMIN C) 1000 MG tablet Take 1,000 mg by mouth daily.  Marland Kitchen aspirin 81 MG tablet Take 81 mg by mouth daily.  Marland Kitchen atenolol-chlorthalidone (TENORETIC) 100-25 MG tablet Take 0.5 tablets by mouth daily.  . B Complex-C (B-COMPLEX WITH VITAMIN C) tablet Take 1 tablet by mouth daily. 115m  . BIOTIN 5000 PO Take 1 tablet by mouth daily.   . cholecalciferol (VITAMIN D) 1000 UNITS tablet Take 5,000 Units by mouth daily.   . Cyanocobalamin (VITAMIN B-12 PO) Take 2,500 mg by mouth daily.  . Multiple Vitamin (MULTIVITAMIN) capsule Take 1 capsule by mouth daily.  . vitamin E (VITAMIN E) 400 UNIT capsule Take 400 Units by mouth daily.  . [EXPIRED] pneumococcal 13-valent conjugate vaccine (PREVNAR 13) SUSP injection Inject 0.5 mLs into the muscle tomorrow at 10 am for 1 dose.  . [EXPIRED] Tdap (BOOSTRIX) 5-2.5-18.5 LF-MCG/0.5 injection Inject 0.5 mLs into the muscle once for 1 dose.   No facility-administered encounter medications on file as of 04/18/2020.     History: Past Medical History:  Diagnosis Date  . Cancer ([facility] 1999   breast, left  . [place]   . Osteoporosis   . TIA (transient ischemic attack) 12/26/2000   NO RESIDUAL PROBLEM   Past Surgical History:  Procedure Laterality Date  . ABDOMINAL HYSTERECTOMY    . BREAST LUMPECTOMY  1999   left  . PARATHYROIDECTOMY N/A 05/06/2013   Procedure: PARATHYROIDECTOMY;  Surgeon: TEarnstine Regal MD;  Location: [facility] ORS;  Service: General;  Laterality: N/A;  . [place]  Family History  Problem Relation Age of Onset  . Hypertension Mother   . Prostate cancer Father   . Heart attack Maternal Grandmother   . Cancer Maternal Grandfather        oral cancer   Social History    Occupational History  . Not on file  Tobacco Use  . Smoking status: Never Smoker  . Smokeless tobacco: Never Used  Substance and Sexual Activity  . Alcohol use: No  . Drug use: No  . Sexual activity: Not on file    Tobacco Counseling Counseling given: Not Answered   Immunizations and Health Maintenance Immunization History  Administered Date(s) Administered  . PFIZER SARS-COV-2 Vaccination 10/31/2019, 11/29/2019   Health Maintenance Due  Topic Date Due  . TETANUS/TDAP  Never done  . DEXA SCAN  Never done  . PNA vac Low Risk Adult (1 of 2 - PCV13) Never done  . INFLUENZA VACCINE  04/01/2020    Activities of Daily Living In your present state of health, do you have any difficulty performing the following activities: 04/18/2020  Hearing? N  Vision? N  Difficulty concentrating or making decisions? N  Walking or climbing stairs? N  Dressing or bathing? N  Doing errands, shopping? N  Some recent data might be hidden    Physical Exam  (optional), or other factors deemed appropriate based on the beneficiary's medical and social history and current clinical standards.  Advanced Directives: Does Patient Have a Medical Advance Directive?: No Would patient like information on creating a medical advance directive?: Yes (MAU/Ambulatory/Procedural Areas - Information given)    Assessment:    This is a routine wellness examination for this patient .  Vision/Hearing screen  Hearing Screening   125Hz  250Hz  500Hz  1000Hz  2000Hz  3000Hz  4000Hz  6000Hz  8000Hz   Right ear:   40 40 40  40    Left ear:   40 40 40  40      Visual Acuity Screening   Right eye Left eye Both eyes  Without correction:     With correction: 20/20-2 20/20 20/20    Dietary issues and exercise activities discussed:     Goals    . DIET - INCREASE WATER INTAKE     She is willing to drink at least 16 oz more of water a day      Depression Screen PHQ 2/9 Scores 04/18/2020  PHQ - 2 Score 0     Fall  Risk Fall Risk  04/18/2020  Falls in the past year? 0    Cognitive Function:     6CIT Screen 04/18/2020  What Year? 0 points  What month? 0 points  What time? 3 points  Count back from 20 0 points  Months in reverse 0 points  Repeat phrase 0 points  Total Score 3    Patient Care Team: [name], FNP as PCP - General (General Practice)     Plan:  See above  I have personally reviewed and noted the following in the patient's chart:   . Medical and social history . Use of alcohol, tobacco or illicit drugs  . Current medications and supplements . Functional ability and status . Nutritional status . Physical activity . Advanced directives . List of other physicians . Hospitalizations, surgeries, and ER visits in previous 12 months . Vitals . Screenings to include cognitive, depression, and falls . Referrals and appointments  In addition, I have reviewed and discussed with patient certain preventive protocols, quality metrics, and best practice recommendations. A  written personalized care plan for preventive services as well as general preventive health recommendations were provided to patient.     [name], FNP 04/19/2020

## 2020-04-18 NOTE — Patient Instructions (Addendum)
Adrienne Lopez , Thank you for taking time to come for your Medicare Wellness Visit. I appreciate your ongoing commitment to your health goals. Please review the following plan we discussed and let me know if I can assist you in the future.   These are the goals we discussed: Goals    . DIET - INCREASE WATER INTAKE     She is willing to drink at least 16 oz more of water a day       This is a list of the screening recommended for you and due dates:  Health Maintenance  Topic Date Due  .  Hepatitis C: One time screening is recommended by Center for Disease Control  (CDC) for  adults born from [birth year] through 1967.   Never done  . Tetanus Vaccine  Never done  . DEXA scan (bone density measurement)  Never done  . Pneumonia vaccines (1 of 2 - PCV13) Never done  . Flu Shot  04/01/2020  . Mammogram  08/29/2021  . Colon Cancer Screening  04/19/2028  . COVID-19 Vaccine  Completed     Health Maintenance After Age 69 After age 64, you are at a higher risk for certain long-term diseases and infections as well as injuries from falls. Falls are a major cause of broken bones and head injuries in people who are older than age 37. Getting regular preventive care can help to keep you healthy and well. Preventive care includes getting regular testing and making lifestyle changes as recommended by your health care provider. Talk with your health care provider about:  Which screenings and tests you should have. A screening is a test that checks for a disease when you have no symptoms.  A diet and exercise plan that is right for you. What should I know about screenings and tests to prevent falls? Screening and testing are the best ways to find a health problem early. Early diagnosis and treatment give you the best chance of managing medical conditions that are common after age 11. Certain conditions and lifestyle choices may make you more likely to have a fall. Your health care provider may  recommend:  Regular vision checks. Poor vision and conditions such as cataracts can make you more likely to have a fall. If you wear glasses, make sure to get your prescription updated if your vision changes.  Medicine review. Work with your health care provider to regularly review all of the medicines you are taking, including over-the-counter medicines. Ask your health care provider about any side effects that may make you more likely to have a fall. Tell your health care provider if any medicines that you take make you feel dizzy or sleepy.  Osteoporosis screening. Osteoporosis is a condition that causes the bones to get weaker. This can make the bones weak and cause them to break more easily.  Blood pressure screening. Blood pressure changes and medicines to control blood pressure can make you feel dizzy.  Strength and balance checks. Your health care provider may recommend certain tests to check your strength and balance while standing, walking, or changing positions.  Foot health exam. Foot pain and numbness, as well as not wearing proper footwear, can make you more likely to have a fall.  Depression screening. You may be more likely to have a fall if you have a fear of falling, feel emotionally low, or feel unable to do activities that you used to do.  Alcohol use screening. Using too much alcohol can affect your  balance and may make you more likely to have a fall. What actions can I take to lower my risk of falls? General instructions  Talk with your health care provider about your risks for falling. Tell your health care provider if: ? You fall. Be sure to tell your health care provider about all falls, even ones that seem minor. ? You feel dizzy, sleepy, or off-balance.  Take over-the-counter and prescription medicines only as told by your health care provider. These include any supplements.  Eat a healthy diet and maintain a healthy weight. A healthy diet includes low-fat dairy  products, low-fat (lean) meats, and fiber from whole grains, beans, and lots of fruits and vegetables. Home safety  Remove any tripping hazards, such as rugs, cords, and clutter.  Install safety equipment such as grab bars in bathrooms and safety rails on stairs.  Keep rooms and walkways well-lit. Activity   Follow a regular exercise program to stay fit. This will help you maintain your balance. Ask your health care provider what types of exercise are appropriate for you.  If you need a cane or walker, use it as recommended by your health care provider.  Wear supportive shoes that have nonskid soles. Lifestyle  Do not drink alcohol if your health care provider tells you not to drink.  If you drink alcohol, limit how much you have: ? 0-1 drink a day for women. ? 0-2 drinks a day for men.  Be aware of how much alcohol is in your drink. In the [place] one drink equals one typical bottle of beer (12 oz), one-half glass of wine (5 oz), or one shot of hard liquor (1 oz).  Do not use any products that contain nicotine or tobacco, such as cigarettes and e-cigarettes. If you need help quitting, ask your health care provider. Summary  Having a healthy lifestyle and getting preventive care can help to protect your health and wellness after age 36.  Screening and testing are the best way to find a health problem early and help you avoid having a fall. Early diagnosis and treatment give you the best chance for managing medical conditions that are more common for people who are older than age 7.  Falls are a major cause of broken bones and head injuries in people who are older than age 53. Take precautions to prevent a fall at home.  Work with your health care provider to learn what changes you can make to improve your health and wellness and to prevent falls. This information is not intended to replace advice given to you by your health care provider. Make sure you discuss any questions you  have with your health care provider. Document Revised: 12/09/2018 Document Reviewed: 07/01/2017 Elsevier Patient Education  2020 [employer].

## 2020-04-19 ENCOUNTER — Encounter: Payer: Self-pay | Admitting: Nurse Practitioner

## 2020-04-19 LAB — CBC
Hematocrit: 42.9 % (ref 34.0–46.6)
Hemoglobin: 14.4 g/dL (ref 11.1–15.9)
MCH: 30.6 pg (ref 26.6–33.0)
MCHC: 33.6 g/dL (ref 31.5–35.7)
MCV: 91 fL (ref 79–97)
Platelets: 153 10*3/uL (ref 150–450)
RBC: 4.7 x10E6/uL (ref 3.77–5.28)
RDW: 14.1 % (ref 11.7–15.4)
WBC: 5.8 10*3/uL (ref 3.4–10.8)

## 2020-04-19 LAB — CMP14+EGFR
ALT: 26 IU/L (ref 0–32)
AST: 25 IU/L (ref 0–40)
Albumin/Globulin Ratio: 1.2 (ref 1.2–2.2)
Albumin: 4.5 g/dL (ref 3.7–4.7)
Alkaline Phosphatase: 49 IU/L (ref 48–121)
BUN/Creatinine Ratio: 21 (ref 12–28)
BUN: 23 mg/dL (ref 8–27)
Bilirubin Total: 0.5 mg/dL (ref 0.0–1.2)
CO2: 27 mmol/L (ref 20–29)
Calcium: 9.6 mg/dL (ref 8.7–10.3)
Chloride: 99 mmol/L (ref 96–106)
Creatinine, Ser: 1.09 mg/dL — ABNORMAL HIGH (ref 0.57–1.00)
GFR calc Af Amer: 59 mL/min/{1.73_m2} — ABNORMAL LOW (ref >59)
GFR calc non Af Amer: 51 mL/min/{1.73_m2} — ABNORMAL LOW (ref >59)
Globulin, Total: 3.8 g/dL (ref 1.5–4.5)
Glucose: 86 mg/dL (ref 65–99)
Potassium: 3.8 mmol/L (ref 3.5–5.2)
Sodium: 140 mmol/L (ref 134–144)
Total Protein: 8.3 g/dL (ref 6.0–8.5)

## 2020-04-19 LAB — LIPID PANEL
Chol/HDL Ratio: 3.5 ratio (ref 0.0–4.4)
Cholesterol, Total: 246 mg/dL — ABNORMAL HIGH (ref 100–199)
HDL: 71 mg/dL (ref >39)
LDL Chol Calc (NIH): 157 mg/dL — ABNORMAL HIGH (ref 0–99)
Triglycerides: 105 mg/dL (ref 0–149)
VLDL Cholesterol Cal: 18 mg/dL (ref 5–40)

## 2020-04-19 LAB — HEPATITIS C ANTIBODY: Hep C Virus Ab: <0.1 s/co ratio (ref 0.0–0.9)

## 2020-04-23 ENCOUNTER — Other Ambulatory Visit: Payer: Self-pay

## 2020-04-23 DIAGNOSIS — E782 Mixed hyperlipidemia: Secondary | ICD-10-CM

## 2020-04-23 MED ORDER — ATORVASTATIN CALCIUM 10 MG PO TABS
10.0000 mg | ORAL_TABLET | Freq: Every day | ORAL | 2 refills | Status: DC
Start: 2020-04-23 — End: 2020-05-21

## 2020-04-23 NOTE — Progress Notes (Signed)
Is she willing to take a medication for her elevated cholesterol?

## 2020-05-13 ENCOUNTER — Other Ambulatory Visit: Payer: Self-pay | Admitting: Internal Medicine

## 2020-05-19 ENCOUNTER — Encounter: Payer: Self-pay | Admitting: Nurse Practitioner

## 2020-05-21 ENCOUNTER — Other Ambulatory Visit: Payer: Self-pay

## 2020-05-21 DIAGNOSIS — E782 Mixed hyperlipidemia: Secondary | ICD-10-CM

## 2020-05-21 MED ORDER — ATORVASTATIN CALCIUM 10 MG PO TABS: 10.0000 mg | ORAL_TABLET | Freq: Every day | ORAL | 1 refills | Status: DC

## 2020-06-08 ENCOUNTER — Encounter: Payer: Self-pay | Admitting: Nurse Practitioner

## 2020-06-20 ENCOUNTER — Other Ambulatory Visit: Payer: Self-pay | Admitting: Nurse Practitioner

## 2020-06-20 DIAGNOSIS — E2839 Other primary ovarian failure: Secondary | ICD-10-CM

## 2020-06-25 LAB — HM DEXA SCAN

## 2020-07-03 ENCOUNTER — Encounter: Payer: Self-pay | Admitting: Nurse Practitioner

## 2020-07-14 ENCOUNTER — Ambulatory Visit: Payer: Medicare (Managed Care) | Attending: Internal Medicine

## 2020-07-14 DIAGNOSIS — Z23 Encounter for immunization: Secondary | ICD-10-CM

## 2020-07-14 NOTE — Progress Notes (Signed)
   Covid-19 Vaccination Clinic  Name:  Lusero Nordlund    MRN: [id number] DOB: 02-May-1951  07/14/2020  Ms. Ambers was observed post Covid-19 immunization for 15 minutes without incident. She was provided with Vaccine Information Sheet and instruction to access the V-Safe system.   Ms. Louissaint was instructed to call 911 with any severe reactions post vaccine: Marland Kitchen Difficulty breathing  . Swelling of face and throat  . A fast heartbeat  . A bad rash all over body  . Dizziness and weakness   Immunizations Administered    Name Date Dose VIS Date Route   Pfizer COVID-19 Vaccine 07/14/2020 12:09 PM 0.3 mL 06/20/2020 Intramuscular   Manufacturer: [place]   Lot: [id number]   [place]: [id number]

## 2020-08-30 LAB — HM MAMMOGRAPHY

## 2020-09-04 ENCOUNTER — Ambulatory Visit (INDEPENDENT_AMBULATORY_CARE_PROVIDER_SITE_OTHER): Payer: Medicare (Managed Care) | Admitting: Nurse Practitioner

## 2020-09-04 ENCOUNTER — Other Ambulatory Visit: Payer: Self-pay

## 2020-09-04 ENCOUNTER — Encounter: Payer: Self-pay | Admitting: Nurse Practitioner

## 2020-09-04 VITALS — BP 112/70 | HR 55 | Temp 98.1°F | Ht 64.2 in | Wt 143.0 lb

## 2020-09-04 DIAGNOSIS — R200 Anesthesia of skin: Secondary | ICD-10-CM

## 2020-09-04 DIAGNOSIS — Z8673 Personal history of transient ischemic attack (TIA), and cerebral infarction without residual deficits: Secondary | ICD-10-CM

## 2020-09-04 DIAGNOSIS — R42 Dizziness and giddiness: Secondary | ICD-10-CM | POA: Diagnosis not present

## 2020-09-04 NOTE — Patient Instructions (Signed)
Transient Ischemic Attack  A transient ischemic attack (TIA) is a "warning stroke" that causes stroke-like symptoms that go away quickly. A TIA does not cause lasting damage to the brain. But having a TIA is a sign that you may be at risk for a stroke. Lifestyle changes and medical treatments can help prevent a stroke. It is important to know the symptoms of a TIA and what to do. Get help right away, even if your symptoms go away. The symptoms of a TIA are the same as those of a stroke. They can happen fast, and they usually go away within minutes or hours. They can include:  Weakness or loss of feeling in your face, arm, or leg. This often happens on one side of your body.  Trouble walking.  Trouble moving your arms or legs.  Trouble talking or understanding what people are saying.  Trouble seeing.  Seeing two of one object (double vision).  Feeling dizzy.  Feeling confused.  Loss of balance or coordination.  Feeling sick to your stomach (nauseous) and throwing up (vomiting).  A very bad headache for no reason. What increases the risk? Certain things may make you more likely to have a TIA. Some of these are things that you can change, such as:  Being very overweight (obese).  Using products that contain nicotine or tobacco, such as cigarettes and e-cigarettes.  Taking birth control pills.  Not being active.  Drinking too much alcohol.  Using drugs. Other risk factors include:  Having an irregular heartbeat (atrial fibrillation).  Being African American or Hispanic.  Having had blood clots, stroke, TIA, or heart attack in the past.  Being a woman with a history of high blood pressure in pregnancy (preeclampsia).  Being over the age of 60.  Being female.  Having family history of stroke.  Having the following diseases or conditions: ? High blood pressure. ? High cholesterol. ? Diabetes. ? Heart disease. ? Sickle cell disease. ? Sleep apnea. ? Migraine  headache. ? Long-term (chronic) diseases that cause soreness and swelling (inflammation). ? Disorders that affect how your blood clots. Follow these instructions at home: Medicines   Take over-the-counter and prescription medicines only as told by your doctor.  If you were told to take aspirin or another medicine to thin your blood, take it exactly as told by your doctor. ? Taking too much of the medicine can cause bleeding. ? Taking too little of the medicine may not work to treat the problem. Eating and drinking   Eat 5 or more servings of fruits and vegetables each day.  Follow instructions from your doctor about your diet. You may need to follow a certain diet to help lower your risk of having a stroke. You may need to: ? Eat a diet that is low in fat and salt. ? Eat foods that contain a lot of fiber. ? Limit the amount of carbohydrates and sugar in your diet.  Limit alcohol intake to 1 drink a day for nonpregnant women and 2 drinks a day for men. One drink equals 12 oz of beer, 5 oz of wine, or 1 oz of hard liquor. General instructions  Keep a healthy weight.  Stay active. Try to get at least 30 minutes of activity on all or most days.  Find out if you have a condition called sleep apnea. Get treatment if needed.  Do not use any products that contain nicotine or tobacco, such as cigarettes and e-cigarettes. If you need help quitting,   ask your doctor.  Do not abuse drugs.  Keep all follow-up visits as told by your doctor. This is important. Get help right away if:  You have any signs of stroke. "BE FAST" is an easy way to remember the main warning signs: ? B - Balance. Signs are dizziness, sudden trouble walking, or loss of balance. ? E - Eyes. Signs are trouble seeing or a sudden change in how you see. ? F - Face. Signs are sudden weakness or loss of feeling of the face, or the face or eyelid drooping on one side. ? A - Arms. Signs are weakness or loss of feeling in an  arm. This happens suddenly and usually on one side of the body. ? S - Speech. Signs are sudden trouble speaking, slurred speech, or trouble understanding what people say. ? T - Time. Time to call emergency services. Write down what time symptoms started.  You have other signs of stroke, such as: ? A sudden, very bad headache with no known cause. ? Feeling sick to your stomach (nausea). ? Throwing up (vomiting). ? Jerky movements that you cannot control (seizure). These symptoms may be an emergency. Do not wait to see if the symptoms will go away. Get medical help right away. Call your local emergency services (911 in the [place] Do not drive yourself to the hospital. Summary  A transient ischemic attack (TIA) is a "warning stroke" that causes stroke-like symptoms that go away quickly.  A TIA is a medical emergency. Get help right away, even if your symptoms go away.  A TIA does not cause lasting damage to the brain.  Having a TIA is a sign that you may be at risk for a stroke. Lifestyle changes and medical treatments can help prevent a stroke. This information is not intended to replace advice given to you by your health care provider. Make sure you discuss any questions you have with your health care provider. Document Revised: 05/14/2018 Document Reviewed: 11/19/2016 Elsevier Patient Education  2020 Elsevier Inc.  

## 2020-09-04 NOTE — Progress Notes (Signed)
I,Yamilka Roman [employer] as a Neurosurgeon for [employer], FNP.,have documented all relevant documentation on the behalf of Arnette Felts, FNP,as directed by  Arnette Felts, FNP while in the presence of Arnette Felts, FNP. This visit occurred during the SARS-CoV-2 public health emergency.  Safety protocols were in place, including screening questions prior to the visit, additional usage of staff PPE, and extensive cleaning of exam room while observing appropriate contact time as indicated for disinfecting solutions.  Subjective:     Patient ID: Adrienne Lopez , female    DOB: 1951-07-21 , 70 y.o.   MRN: [id number]   Chief Complaint  Patient presents with  . clogged ears    Patient stated her right ear feels like its clogged and also feels pressure in it sometimes and it causes a headache  . Numbness    Patient stated she sometimes feels numbness in her right arm.     HPI  Patient presents today for fullness and pressure in her right ear. She stated it sometimes causes her to have a headache.   A few days before christmas felt groggy with a headache, then she started feeling dizzy lasting for approximately 3 seconds, later began having a coldness and numbness to her right arm, would come and go. Then on Sunday night while lying in bed on her back could here the rain drops when turned on left side could not hear anything then when lifted head was able to hear again. She also had pressure in her ear.  She did not have any problem holding objects. She does have a history of TIA in 2002 when she was on tamoxifen.    This morning when she woke up she had coldness to her right arm right now she is good.     Past Medical History:  Diagnosis Date  . Cancer [facility]) 1999   breast, left  . Hyperparathyroidism, primary (HCC)   . Osteoporosis   . TIA (transient ischemic attack) 12/26/2000   NO RESIDUAL PROBLEM     Family History  Problem Relation Age of Onset  . Hypertension Mother   . Prostate  cancer Father   . Heart attack Maternal Grandmother   . Cancer Maternal Grandfather        oral cancer     Current Outpatient Medications:  .  Ascorbic Acid (VITAMIN C) 1000 MG tablet, Take 1,000 mg by mouth daily., Disp: , Rfl:  .  aspirin 81 MG tablet, Take 81 mg by mouth daily., Disp: , Rfl:  .  atenolol-chlorthalidone (TENORETIC) 100-25 MG tablet, TAKE 1/2 (ONE-HALF) TABLET BY MOUTH DAILY, Disp: 45 tablet, Rfl: 2 .  atorvastatin (LIPITOR) 10 MG tablet, Take 1 tablet (10 mg total) by mouth daily., Disp: 90 tablet, Rfl: 1 .  B Complex-C (B-COMPLEX WITH VITAMIN C) tablet, Take 1 tablet by mouth daily. 100mg , Disp: , Rfl:  .  BIOTIN 5000 PO, Take 1 tablet by mouth daily. , Disp: , Rfl:  .  cholecalciferol (VITAMIN D) 1000 UNITS tablet, Take 5,000 Units by mouth daily., Disp: , Rfl:  .  Cyanocobalamin (VITAMIN B-12 PO), Take 2,500 mg by mouth daily., Disp: , Rfl:  .  Multiple Vitamin (MULTIVITAMIN) capsule, Take 1 capsule by mouth daily., Disp: , Rfl:  .  vitamin E 180 MG (400 UNITS) capsule, Take 400 Units by mouth daily., Disp: , Rfl:    No Known Allergies   Review of Systems  Constitutional: Negative.   Respiratory: Negative.   Cardiovascular: Negative.  Neurological: Positive for light-headedness. Negative for dizziness and headaches.       Right arm numbness few days ago  Psychiatric/Behavioral: Negative.      Today's Vitals   09/04/20 1538  BP: 112/70  Pulse: (!) 55  Temp: 98.1 F (36.7 C)  TempSrc: Oral  Weight: 143 lb (64.9 kg)  Height: 5' 4.2" (1.631 m)  PainSc: 0-No pain   Body mass index is 24.39 kg/m.   Objective:  Physical Exam Constitutional:      General: She is not in acute distress.    Appearance: Normal appearance.  Cardiovascular:     Rate and Rhythm: Normal rate and regular rhythm.     Pulses: Normal pulses.     Heart sounds: Normal heart sounds. No murmur heard.   Pulmonary:     Effort: Pulmonary effort is normal. No respiratory distress.      Breath sounds: Normal breath sounds.  Skin:    Capillary Refill: Capillary refill takes less than 2 seconds.  Neurological:     General: No focal deficit present.     Mental Status: She is alert and oriented to person, place, and time.     GCS: GCS eye subscore is 4. GCS verbal subscore is 5. GCS motor subscore is 6.     Cranial Nerves: No cranial nerve deficit.     Sensory: No sensory deficit.     Motor: No weakness.     Coordination: Coordination normal.     Gait: Gait normal.     Deep Tendon Reflexes: Reflexes normal.  Psychiatric:        Mood and Affect: Mood normal.        Behavior: Behavior normal.        Thought Content: Thought content normal.        Judgment: Judgment normal.         Assessment And Plan:     1. Right arm numbness  Will check CT scan to see if has a remote stroke due to the numbness this improved   Strength is equal bilaterally - CT Head Wo Contrast; Future  2. Dizziness  Due to the new dizziness and symptoms of the numbness will check CT scan of head  She is advised if worsens to go to ER and to ensure she is taking an ASA 81 mg daily - CT Head Wo Contrast; Future  3. History of TIA (transient ischemic attack) - CT Head Wo Contrast; Future     Patient was given opportunity to ask questions. Patient verbalized understanding of the plan and was able to repeat key elements of the plan. All questions were answered to their satisfaction.  [name], FNP    I, [name], FNP, have reviewed all documentation for this visit. The documentation on 09/11/20 for the exam, diagnosis, procedures, and orders are all accurate and complete.   THE PATIENT IS ENCOURAGED TO PRACTICE SOCIAL DISTANCING DUE TO THE COVID-19 PANDEMIC.

## 2020-09-05 ENCOUNTER — Encounter: Payer: Self-pay | Admitting: Nurse Practitioner

## 2020-09-10 ENCOUNTER — Encounter: Payer: Self-pay | Admitting: Nurse Practitioner

## 2020-09-20 ENCOUNTER — Other Ambulatory Visit: Payer: Medicare (Managed Care)

## 2020-09-26 ENCOUNTER — Ambulatory Visit
Admission: RE | Admit: 2020-09-26 | Discharge: 2020-09-26 | Disposition: A | Discharge status: Home / Self Care | Payer: Medicare (Managed Care) | Source: Ambulatory Visit | Attending: Nurse Practitioner | Admitting: Nurse Practitioner

## 2020-09-26 DIAGNOSIS — R200 Anesthesia of skin: Secondary | ICD-10-CM

## 2020-09-26 DIAGNOSIS — Z8673 Personal history of transient ischemic attack (TIA), and cerebral infarction without residual deficits: Secondary | ICD-10-CM

## 2020-09-26 DIAGNOSIS — R42 Dizziness and giddiness: Secondary | ICD-10-CM

## 2020-11-12 ENCOUNTER — Other Ambulatory Visit: Payer: Self-pay | Admitting: Nurse Practitioner

## 2020-11-12 DIAGNOSIS — E782 Mixed hyperlipidemia: Secondary | ICD-10-CM

## 2020-11-13 ENCOUNTER — Other Ambulatory Visit: Payer: Self-pay | Admitting: Nurse Practitioner

## 2020-11-13 DIAGNOSIS — E782 Mixed hyperlipidemia: Secondary | ICD-10-CM

## 2020-11-13 MED ORDER — ATORVASTATIN CALCIUM 10 MG PO TABS: 10.0000 mg | ORAL_TABLET | Freq: Every day | ORAL | 0 refills | Status: DC

## 2020-11-19 ENCOUNTER — Encounter: Payer: Self-pay | Admitting: Internal Medicine

## 2020-11-19 ENCOUNTER — Ambulatory Visit (INDEPENDENT_AMBULATORY_CARE_PROVIDER_SITE_OTHER): Payer: Medicare (Managed Care) | Admitting: Internal Medicine

## 2020-11-19 ENCOUNTER — Other Ambulatory Visit: Payer: Self-pay

## 2020-11-19 VITALS — BP 126/84 | HR 59 | Ht 64.5 in | Wt 144.6 lb

## 2020-11-19 DIAGNOSIS — I10 Essential (primary) hypertension: Secondary | ICD-10-CM | POA: Diagnosis not present

## 2020-11-19 DIAGNOSIS — E785 Hyperlipidemia, unspecified: Secondary | ICD-10-CM

## 2020-11-19 DIAGNOSIS — G459 Transient cerebral ischemic attack, unspecified: Secondary | ICD-10-CM

## 2020-11-19 LAB — LIPID PANEL
Chol/HDL Ratio: 2.4 ratio (ref 0.0–4.4)
Cholesterol, Total: 184 mg/dL (ref 100–199)
HDL: 78 mg/dL (ref >39)
LDL Chol Calc (NIH): 90 mg/dL (ref 0–99)
Triglycerides: 89 mg/dL (ref 0–149)
VLDL Cholesterol Cal: 16 mg/dL (ref 5–40)

## 2020-11-19 NOTE — Patient Instructions (Signed)
Medication Instructions:  Your physician recommends that you continue on your current medications as directed. Please refer to the Current Medication list given to you today.  *If you need a refill on your cardiac medications before your next appointment, please call your pharmacy*   Lab Work: LIPID PANEL today   If you have labs (blood work) drawn today and your tests are completely normal, you will receive your results only by: Marland Kitchen MyChart Message (if you have MyChart) OR . A paper copy in the mail If you have any lab test that is abnormal or we need to change your treatment, we will call you to review the results.   Testing/Procedures: Your physician has requested that you have an echocardiogram with bubble study. Echocardiography is a painless test that uses sound waves to create images of your heart. It provides your doctor with information about the size and shape of your heart and how well your heart's chambers and valves are working. This procedure takes approximately one hour. There are no restrictions for this procedure. -- [address]  Your physician has requested that you have a carotid duplex. This test is an ultrasound of the carotid arteries in your neck. It looks at blood flow through these arteries that supply the brain with blood. Allow one hour for this exam. There are no restrictions or special instructions. -- Dr. Lysbeth Penner office     Follow-Up: At [facility], you and your health needs are our priority.  As part of our continuing mission to provide you with exceptional heart care, we have created designated Provider Care Teams.  These Care Teams include your primary Cardiologist (physician) and Advanced Practice Providers (APPs -  Physician Assistants and Nurse Practitioners) who all work together to provide you with the care you need, when you need it.  We recommend signing up for the patient portal called "MyChart".  Sign up information is  provided on this After Visit Summary.  MyChart is used to connect with patients for Virtual Visits (Telemedicine).  Patients are able to view lab/test results, encounter notes, upcoming appointments, etc.  Non-urgent messages can be sent to your provider as well.   To learn more about what you can do with MyChart, go to [email or web address].    Your next appointment:   2 month(s)  The format for your next appointment:   In Person  Provider:   You may see Dr. Debara Pickett or one of the following Advanced Practice Providers on your designated Care Team:    Almyra Deforest, PA-C  Fabian Sharp, [place] or   Roby Lofts, [place]    Other Instructions

## 2020-11-19 NOTE — Progress Notes (Signed)
OFFICE NOTE  Chief Complaint:  Follow-up, ?recent TIA  Primary Care Physician: [name], FNP  HPI:  Adrienne Lopez is a pleasant 70 year old female from [place]. She was noted to be hypercalcemic and ultimately diagnosed with hyperparathyroidism. She underwent parathyroid surgery by Dr. Harlow Asa in September. She did well since then however subsequently developed palpitations. She describes the palpitations as a hard thumping in her chest and neck were occurring several times a day after surgery. The symptoms have improved somewhat and it seems to be coincidental with her taking aspirin.  She does have a history of TIA in the past, but the episode was unclear as she "blacked out" when she was taking medication for breast cancer, specifically tamoxifen. She was taken off of that at that time. She did have a history of breast cancer and underwent chest wall radiation in 1999 but has been free of recurrence. Otherwise she has no other medical history and is currently not on any medications other than aspirin. He recently had blood work through Dr. Saul Fordyce office which indicates her thyroid function is low normal but her parathyroid is within normal limits and her calcium is now 9.3. She reports her palpitations occur a couple times a day and feel like a strong beating of the heart that causes her some mild shortness of breath. She denies any chest pain or anginal symptoms. There is no significant family history of heart disease.   At her last visit I recommended an echocardiogram and 48 hour monitor. She did wear the monitor for 48 hours which reported actually a number of both ventricular and atrial ectopic beats. Infectious thousand and 26 bigeminal ventricular beats. There was no significant nonsustained VT but there was some sustained supraventricular tachycardia.  She underwent an echocardiogram which was essentially normal except for some mitral regurgitation, but no wall motion abnormalities  and preserved EF of 55-60%.  Subsequent to this, she had an episode of chest pain which awoke her up at night on November 7. She went to the emergency department with symptoms of chest tightness and left arm pain. The symptoms resolved fairly quickly and she ruled out for MI. She did report some palpitations associated with this event.  In addition to her parathyroid problems, there is a question about thyroid abnormality as well. She is currently undergoing testing for this. She says all of these symptoms have come on since her surgery for the parathyroid glands.  Adrienne Lopez returns today and reports feeling well. She continues to have infrequent palpitations, despite atenolol.  She denies any chest pain or worsening shortness of breath. She's had no further TIA events. She's been compliant with daily aspirin.  12/02/2016  Adrienne Lopez returns for follow-up. Over the past year she has done well without complaints. She denies any palpitations. She followed up with Dr. Chalmers Cater and was noted to have elevated blood sugars - she was told to lose 10 pounds and she was able to successfully. BP was mildly elevated today, but came down to 134/86 after a re-check.  01/05/2018  Adrienne Lopez was seen today in follow-up.  Again she continues to do well.  She denies chest pain or worsening shortness of breath.  She has no palpitations.  Labs from July 2018 showed total cholesterol 195, HDL 66, LDL 111 and triglycerides 92.  Serum creatinine of 1.07.  Blood pressures been stable.  EKG today shows sinus bradycardia at 53.  05/03/2019  Adrienne Lopez is seen today for follow-up  of hypertension.  Recently I did a telemedicine visit with her and her blood pressure was elevated.  I asked her to take some home blood pressure readings and submit that to me.  This was done in late May and early June.  Her blood pressures averaged between 818 and 563 systolic in the morning and generally between 115-120 2 in the evening.  We had  readjusted her medications to 50 mg of atenolol once a day in the morning.  Overall though I am not convinced that we have had very good control of her morning blood pressures.  06/02/2019  Adrienne Lopez returns today for follow-up of hypertension.  After further adjusting her medication it seems that we have now improved her blood pressures adequately.  She is currently taking atenolol 50 mg in the morning and chlorthalidone 12.5 mg daily.  She says this medicine has been difficult to cut up and oftentimes pulverize is the pill.  We discussed the possibility of taking the higher dose atenolol 100 mg / 25 mg combination with chlorthalidone and breaking it in half since the tablet is scored.  11/19/2020  Adrienne Lopez is seen today in follow-up.  Unfortunately January was not a good month for her.  It sounds like she may have had a TIA.  She had initial symptoms of right arm numbness and tingling as well as heaviness.  She also had unilateral headache and fullness in her ear with hearing loss.  This did resolve after less than 24 hours.  She saw her PCP who ordered a CT scan of her head which apparently showed no acute findings.  She was off of her aspirin and that was restarted.  She is also now on low-dose atorvastatin.  Labs as of August 2021 showed total cholesterol 246, HDL 71, LDL 157 and triglycerides 105.  If this was TIA then her target is an LDL less than 70.  Her history also suggest that she had TIA in the distant past as well.  She felt some palpitations recently however they were short-lived.  EKG today shows a sinus bradycardia at 59.  PMHx:  Past Medical History:  Diagnosis Date  . Cancer [facility]) 1999   breast, left  . Hyperparathyroidism, primary ([place])   . Osteoporosis   . TIA (transient ischemic attack) 12/26/2000   NO RESIDUAL PROBLEM    Past Surgical History:  Procedure Laterality Date  . ABDOMINAL HYSTERECTOMY    . BREAST LUMPECTOMY  1999   left  . PARATHYROIDECTOMY N/A 05/06/2013    Procedure: PARATHYROIDECTOMY;  Surgeon: Earnstine Regal, MD;  Location: [facility] ORS;  Service: General;  Laterality: N/A;  . PARTIAL HYSTERECTOMY  1991    FAMHx:  Family History  Problem Relation Age of Onset  . Hypertension Mother   . Prostate cancer Father   . Heart attack Maternal Grandmother   . Cancer Maternal Grandfather        oral cancer    SOCHx:   reports that she has never smoked. She has never used smokeless tobacco. She reports that she does not drink alcohol and does not use drugs.  ALLERGIES:  No Known Allergies  ROS: Pertinent items noted in HPI and remainder of comprehensive ROS otherwise negative.  HOME MEDS: Current Outpatient Medications  Medication Sig Dispense Refill  . Ascorbic Acid (VITAMIN C) 1000 MG tablet Take 1,000 mg by mouth daily.    Marland Kitchen aspirin 81 MG tablet Take 81 mg by mouth daily.    Marland Kitchen atenolol-chlorthalidone (  TENORETIC) 100-25 MG tablet TAKE 1/2 (ONE-HALF) TABLET BY MOUTH DAILY 45 tablet 2  . atorvastatin (LIPITOR) 10 MG tablet Take 1 tablet (10 mg total) by mouth daily. 90 tablet 0  . B Complex-C (B-COMPLEX WITH VITAMIN C) tablet Take 1 tablet by mouth daily. 100mg     . BIOTIN 5000 PO Take 1 tablet by mouth daily.     . cholecalciferol (VITAMIN D) 1000 UNITS tablet Take 5,000 Units by mouth daily.    . Cyanocobalamin (VITAMIN B-12 PO) Take 2,500 mg by mouth daily.    . Multiple Vitamin (MULTIVITAMIN) capsule Take 1 capsule by mouth daily.     No current facility-administered medications for this visit.    LABS/IMAGING: No results found for this or any previous visit (from the past 48 hour(s)). No results found.  VITALS: BP 126/84   Pulse (!) 59   Ht 5' 4.5" (1.638 m)   Wt 144 lb 9.6 oz (65.6 kg)   SpO2 95%   BMI 24.44 kg/m   EXAM: Deferred  EKG: Sinus bradycardia at 59, possible left atrial enlargement-personally reviewed  ASSESSMENT: 1. Chest pain-resolved, low risk NST on 08/04/2013 2. Continued palpitations - PAC's and PVC;s  with bigeminy 3. Recent hyperparathyroidism status post surgery 4. 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 History of Breast CA s/p chemotherapy and chest wall radiation 6. Essential hypertension  PLAN: 1.   Ms. 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  She was off of aspirin and that has been restarted.  She is on low-dose atorvastatin however her goal LDL is less than 70 and I suspect she will not reach that on this current dose of medicine.  We will plan to repeat lipid since she has been on the medicine for a while to see if she has had some improvement.  I may need to titrate it further.  As part of her work-up for TIA/stroke, would recommend bilateral carotid Dopplers as well as an echo with bubble study to rule out possible PFO.  I also advised her to consider purchasing a cardia mobile device to look for possible intermittent atrial fibrillation.  Plan follow-up with me after the studies.  Pixie Casino, MD, [facility], [place] Director of the Advanced Lipid Disorders &  Cardiovascular Risk Reduction Clinic Diplomate of the American Board of Clinical Lipidology Attending Cardiologist  Direct Dial: [phone]  Fax: [phone]  Website:  [email or web address] 11/19/2020, 9:17 AM

## 2020-11-21 ENCOUNTER — Other Ambulatory Visit: Payer: Self-pay | Admitting: *Deleted

## 2020-11-21 DIAGNOSIS — E782 Mixed hyperlipidemia: Secondary | ICD-10-CM

## 2020-11-21 MED ORDER — ATORVASTATIN CALCIUM 40 MG PO TABS: 40.0000 mg | ORAL_TABLET | Freq: Every day | ORAL | 3 refills | Status: DC

## 2020-11-26 ENCOUNTER — Other Ambulatory Visit: Payer: Self-pay

## 2020-11-26 ENCOUNTER — Ambulatory Visit (HOSPITAL_COMMUNITY)
Admission: RE | Admit: 2020-11-26 | Discharge: 2020-11-26 | Disposition: A | Discharge status: Home / Self Care | Payer: Medicare (Managed Care) | Source: Ambulatory Visit | Attending: Cardiology | Admitting: Cardiology

## 2020-11-26 DIAGNOSIS — G459 Transient cerebral ischemic attack, unspecified: Secondary | ICD-10-CM | POA: Insufficient documentation

## 2020-11-26 DIAGNOSIS — E785 Hyperlipidemia, unspecified: Secondary | ICD-10-CM | POA: Diagnosis not present

## 2020-12-05 NOTE — Telephone Encounter (Signed)
Thanks Almyra Free  Dr Lemmie Evens

## 2020-12-18 ENCOUNTER — Ambulatory Visit (HOSPITAL_COMMUNITY): Payer: Medicare (Managed Care) | Attending: Cardiovascular Disease

## 2020-12-18 ENCOUNTER — Other Ambulatory Visit: Payer: Self-pay

## 2020-12-18 DIAGNOSIS — G459 Transient cerebral ischemic attack, unspecified: Secondary | ICD-10-CM | POA: Diagnosis not present

## 2020-12-18 DIAGNOSIS — E785 Hyperlipidemia, unspecified: Secondary | ICD-10-CM | POA: Diagnosis not present

## 2020-12-18 LAB — ECHOCARDIOGRAM COMPLETE BUBBLE STUDY
Area-P 1/2: 4.63 cm2
P 1/2 time: 576 msec
S' Lateral: 2.3 cm

## 2021-01-03 ENCOUNTER — Other Ambulatory Visit: Payer: Self-pay | Admitting: Internal Medicine

## 2021-01-03 LAB — LIPID PANEL
Chol/HDL Ratio: 2.3 ratio (ref 0.0–4.4)
Cholesterol, Total: 156 mg/dL (ref 100–199)
HDL: 69 mg/dL (ref >39)
LDL Chol Calc (NIH): 73 mg/dL (ref 0–99)
Triglycerides: 75 mg/dL (ref 0–149)
VLDL Cholesterol Cal: 14 mg/dL (ref 5–40)

## 2021-01-10 ENCOUNTER — Other Ambulatory Visit: Payer: Self-pay

## 2021-01-10 ENCOUNTER — Ambulatory Visit: Payer: Medicare (Managed Care) | Admitting: Internal Medicine

## 2021-01-10 ENCOUNTER — Encounter: Payer: Self-pay | Admitting: Internal Medicine

## 2021-01-10 VITALS — BP 124/80 | HR 65 | Ht 64.0 in | Wt 136.0 lb

## 2021-01-10 DIAGNOSIS — E782 Mixed hyperlipidemia: Secondary | ICD-10-CM | POA: Diagnosis not present

## 2021-01-10 DIAGNOSIS — I10 Essential (primary) hypertension: Secondary | ICD-10-CM | POA: Diagnosis not present

## 2021-01-10 DIAGNOSIS — G459 Transient cerebral ischemic attack, unspecified: Secondary | ICD-10-CM | POA: Diagnosis not present

## 2021-01-10 NOTE — Patient Instructions (Signed)

## 2021-01-10 NOTE — Progress Notes (Signed)
OFFICE NOTE  Chief Complaint:  Follow-up, ?recent TIA  Primary Care Physician: [name], FNP  HPI:  Adrienne Lopez is a pleasant 70 year old female from [place]. She was noted to be hypercalcemic and ultimately diagnosed with hyperparathyroidism. She underwent parathyroid surgery by Dr. Harlow Asa in September. She did well since then however subsequently developed palpitations. She describes the palpitations as a hard thumping in her chest and neck were occurring several times a day after surgery. The symptoms have improved somewhat and it seems to be coincidental with her taking aspirin.  She does have a history of TIA in the past, but the episode was unclear as she "blacked out" when she was taking medication for breast cancer, specifically tamoxifen. She was taken off of that at that time. She did have a history of breast cancer and underwent chest wall radiation in 1999 but has been free of recurrence. Otherwise she has no other medical history and is currently not on any medications other than aspirin. He recently had blood work through Dr. Saul Fordyce office which indicates her thyroid function is low normal but her parathyroid is within normal limits and her calcium is now 9.3. She reports her palpitations occur a couple times a day and feel like a strong beating of the heart that causes her some mild shortness of breath. She denies any chest pain or anginal symptoms. There is no significant family history of heart disease.   At her last visit I recommended an echocardiogram and 48 hour monitor. She did wear the monitor for 48 hours which reported actually a number of both ventricular and atrial ectopic beats. Infectious thousand and 26 bigeminal ventricular beats. There was no significant nonsustained VT but there was some sustained supraventricular tachycardia.  She underwent an echocardiogram which was essentially normal except for some mitral regurgitation, but no wall motion abnormalities  and preserved EF of 55-60%.  Subsequent to this, she had an episode of chest pain which awoke her up at night on November 7. She went to the emergency department with symptoms of chest tightness and left arm pain. The symptoms resolved fairly quickly and she ruled out for MI. She did report some palpitations associated with this event.  In addition to her parathyroid problems, there is a question about thyroid abnormality as well. She is currently undergoing testing for this. She says all of these symptoms have come on since her surgery for the parathyroid glands.  Adrienne Lopez returns today and reports feeling well. She continues to have infrequent palpitations, despite atenolol.  She denies any chest pain or worsening shortness of breath. She's had no further TIA events. She's been compliant with daily aspirin.  12/02/2016  Adrienne Lopez returns for follow-up. Over the past year she has done well without complaints. She denies any palpitations. She followed up with Dr. Chalmers Cater and was noted to have elevated blood sugars - she was told to lose 10 pounds and she was able to successfully. BP was mildly elevated today, but came down to 134/86 after a re-check.  01/05/2018  Adrienne Lopez was seen today in follow-up.  Again she continues to do well.  She denies chest pain or worsening shortness of breath.  She has no palpitations.  Labs from July 2018 showed total cholesterol 195, HDL 66, LDL 111 and triglycerides 92.  Serum creatinine of 1.07.  Blood pressures been stable.  EKG today shows sinus bradycardia at 53.  05/03/2019  Adrienne Lopez is seen today for follow-up  of hypertension.  Recently I did a telemedicine visit with her and her blood pressure was elevated.  I asked her to take some home blood pressure readings and submit that to me.  This was done in late May and early June.  Her blood pressures averaged between 725 and 366 systolic in the morning and generally between 115-120 2 in the evening.  We had  readjusted her medications to 50 mg of atenolol once a day in the morning.  Overall though I am not convinced that we have had very good control of her morning blood pressures.  06/02/2019  Adrienne Lopez returns today for follow-up of hypertension.  After further adjusting her medication it seems that we have now improved her blood pressures adequately.  She is currently taking atenolol 50 mg in the morning and chlorthalidone 12.5 mg daily.  She says this medicine has been difficult to cut up and oftentimes pulverize is the pill.  We discussed the possibility of taking the higher dose atenolol 100 mg / 25 mg combination with chlorthalidone and breaking it in half since the tablet is scored.  11/19/2020  Adrienne Lopez is seen today in follow-up.  Unfortunately January was not a good month for her.  It sounds like she may have had a TIA.  She had initial symptoms of right arm numbness and tingling as well as heaviness.  She also had unilateral headache and fullness in her ear with hearing loss.  This did resolve after less than 24 hours.  She saw her PCP who ordered a CT scan of her head which apparently showed no acute findings.  She was off of her aspirin and that was restarted.  She is also now on low-dose atorvastatin.  Labs as of August 2021 showed total cholesterol 246, HDL 71, LDL 157 and triglycerides 105.  If this was TIA then her target is an LDL less than 70.  Her history also suggest that she had TIA in the distant past as well.  She felt some palpitations recently however they were short-lived.  EKG today shows a sinus bradycardia at 59.  01/10/2021  Adrienne Lopez returns today for follow-up.  She underwent a work-up for her TIA including an echo which showed some mild valvular insufficiency but normal LV function and negative for PFO.  Carotid Doppler showed minimal to near normal findings.  She did have a repeat lipid which showed further improvement from her labs a month ago.  Total cholesterol is now  156, triglycerides 75, HDL 69 and LDL 73 down from an LDL of 90 and 157 approximately 8 months ago.  Overall she feels well.  PMHx:  Past Medical History:  Diagnosis Date  . Cancer [facility]) 1999   breast, left  . Hyperparathyroidism, primary ([place])   . Osteoporosis   . TIA (transient ischemic attack) 12/26/2000   NO RESIDUAL PROBLEM    Past Surgical History:  Procedure Laterality Date  . ABDOMINAL HYSTERECTOMY    . BREAST LUMPECTOMY  1999   left  . PARATHYROIDECTOMY N/A 05/06/2013   Procedure: PARATHYROIDECTOMY;  Surgeon: Earnstine Regal, MD;  Location: [facility] ORS;  Service: General;  Laterality: N/A;  . PARTIAL HYSTERECTOMY  1991    FAMHx:  Family History  Problem Relation Age of Onset  . Hypertension Mother   . Prostate cancer Father   . Heart attack Maternal Grandmother   . Cancer Maternal Grandfather        oral cancer    SOCHx:  reports that she has never smoked. She has never used smokeless tobacco. She reports that she does not drink alcohol and does not use drugs.  ALLERGIES:  No Known Allergies  ROS: Pertinent items noted in HPI and remainder of comprehensive ROS otherwise negative.  HOME MEDS: Current Outpatient Medications  Medication Sig Dispense Refill  . Ascorbic Acid (VITAMIN C) 1000 MG tablet Take 1,000 mg by mouth daily.    Marland Kitchen aspirin 81 MG tablet Take 81 mg by mouth daily.    Marland Kitchen atenolol-chlorthalidone (TENORETIC) 100-25 MG tablet TAKE 1/2 (ONE-HALF) TABLET BY MOUTH DAILY 45 tablet 2  . atorvastatin (LIPITOR) 40 MG tablet Take 1 tablet (40 mg total) by mouth daily. 90 tablet 3  . B Complex-C (B-COMPLEX WITH VITAMIN C) tablet Take 1 tablet by mouth daily. 100mg     . BIOTIN 5000 PO Take 1 tablet by mouth daily.     . cholecalciferol (VITAMIN D) 1000 UNITS tablet Take 5,000 Units by mouth daily.    . Cyanocobalamin (VITAMIN B-12 PO) Take 2,500 mg by mouth daily.    . Multiple Vitamin (MULTIVITAMIN) capsule Take 1 capsule by mouth daily.     No current  facility-administered medications for this visit.    LABS/IMAGING: No results found for this or any previous visit (from the past 48 hour(s)). No results found.  VITALS: BP (!) 155/80   Pulse 65   Ht 5\' 4"  (1.626 m)   Wt 136 lb (61.7 kg)   SpO2 96%   BMI 23.34 kg/m   EXAM: General appearance: alert and no distress Lungs: clear to auscultation bilaterally Heart: regular rate and rhythm, S1, S2 normal, no murmur, click, rub or gallop Psych: Pleasant  EKG: Sinus bradycardia at 59, possible left atrial enlargement-personally reviewed  ASSESSMENT: 1. Chest pain-resolved, low risk NST on 08/04/2013 2. Continued palpitations - PAC's and PVC;s with bigeminy 3. Recent hyperparathyroidism status post surgery 4. 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 History of Breast CA s/p chemotherapy and chest wall radiation 6. Essential hypertension  PLAN: 1.   Ms. Vitullo unrevealing echo findings with a negative bubble study.  Her carotid Dopplers were not significantly abnormal.  Cholesterol is much better at this point.  She should continue her current therapy and diet.  She has a follow-up with her PCP for a wellness visit in August.  No changes today.  Plan follow-up with me in 6 months or sooner as necessary.  Pixie Casino, MD, [facility], [place] Director of the [facility] Lipid Disorders &  Cardiovascular [facility] Reduction [facility] Diplomate of the American Board of Clinical Lipidology Attending Cardiologist  Direct Dial: [phone]  Fax: [phone]  Website:  [email or web address] 01/10/2021, 12:04 PM

## 2021-02-09 ENCOUNTER — Other Ambulatory Visit: Payer: Self-pay | Admitting: Internal Medicine

## 2021-04-24 ENCOUNTER — Ambulatory Visit: Payer: Medicare (Managed Care)

## 2021-04-24 ENCOUNTER — Other Ambulatory Visit: Payer: Self-pay

## 2021-04-24 ENCOUNTER — Encounter: Payer: Self-pay | Admitting: Nurse Practitioner

## 2021-04-24 ENCOUNTER — Ambulatory Visit (INDEPENDENT_AMBULATORY_CARE_PROVIDER_SITE_OTHER): Payer: Medicare (Managed Care) | Admitting: Nurse Practitioner

## 2021-04-24 VITALS — BP 126/84 | HR 65 | Temp 97.5°F | Ht 63.0 in | Wt 139.6 lb

## 2021-04-24 DIAGNOSIS — H6122 Impacted cerumen, left ear: Secondary | ICD-10-CM | POA: Diagnosis not present

## 2021-04-24 DIAGNOSIS — Z0000 Encounter for general adult medical examination without abnormal findings: Secondary | ICD-10-CM

## 2021-04-24 DIAGNOSIS — Z23 Encounter for immunization: Secondary | ICD-10-CM | POA: Diagnosis not present

## 2021-04-24 DIAGNOSIS — K219 Gastro-esophageal reflux disease without esophagitis: Secondary | ICD-10-CM | POA: Diagnosis not present

## 2021-04-24 DIAGNOSIS — I10 Essential (primary) hypertension: Secondary | ICD-10-CM

## 2021-04-24 LAB — POCT URINALYSIS DIPSTICK
Bilirubin, UA: NEGATIVE
Glucose, UA: POSITIVE — AB
Ketones, UA: NEGATIVE
Nitrite, UA: NEGATIVE
Protein, UA: NEGATIVE
Spec Grav, UA: 1.015 (ref 1.010–1.025)
Urobilinogen, UA: 0.2 E.U./dL
pH, UA: 7.5 (ref 5.0–8.0)

## 2021-04-24 LAB — LIPID PANEL
Chol/HDL Ratio: 2.3 ratio (ref 0.0–4.4)
Cholesterol, Total: 161 mg/dL (ref 100–199)
HDL: 71 mg/dL (ref >39)
LDL Chol Calc (NIH): 77 mg/dL (ref 0–99)
Triglycerides: 68 mg/dL (ref 0–149)
VLDL Cholesterol Cal: 13 mg/dL (ref 5–40)

## 2021-04-24 LAB — CMP14+EGFR
ALT: 31 IU/L (ref 0–32)
AST: 35 IU/L (ref 0–40)
Albumin/Globulin Ratio: 1.4 (ref 1.2–2.2)
Albumin: 4.8 g/dL — ABNORMAL HIGH (ref 3.7–4.7)
Alkaline Phosphatase: 50 IU/L (ref 44–121)
BUN/Creatinine Ratio: 26 (ref 12–28)
BUN: 25 mg/dL (ref 8–27)
Bilirubin Total: 0.6 mg/dL (ref 0.0–1.2)
CO2: 26 mmol/L (ref 20–29)
Calcium: 10.1 mg/dL (ref 8.7–10.3)
Chloride: 99 mmol/L (ref 96–106)
Creatinine, Ser: 0.98 mg/dL (ref 0.57–1.00)
Globulin, Total: 3.5 g/dL (ref 1.5–4.5)
Glucose: 85 mg/dL (ref 65–99)
Potassium: 3.7 mmol/L (ref 3.5–5.2)
Sodium: 140 mmol/L (ref 134–144)
Total Protein: 8.3 g/dL (ref 6.0–8.5)
eGFR: 61 mL/min/{1.73_m2} (ref >59)

## 2021-04-24 LAB — CBC
Hematocrit: 42.4 % (ref 34.0–46.6)
Hemoglobin: 14 g/dL (ref 11.1–15.9)
MCH: 30.3 pg (ref 26.6–33.0)
MCHC: 33 g/dL (ref 31.5–35.7)
MCV: 92 fL (ref 79–97)
Platelets: 136 10*3/uL — ABNORMAL LOW (ref 150–450)
RBC: 4.62 x10E6/uL (ref 3.77–5.28)
RDW: 13.8 % (ref 11.7–15.4)
WBC: 4.9 10*3/uL (ref 3.4–10.8)

## 2021-04-24 LAB — POCT UA - MICROALBUMIN
Albumin/Creatinine Ratio, Urine, POC: 30
Creatinine, POC: 100 mg/dL
Microalbumin Ur, POC: 10 mg/L

## 2021-04-24 MED ORDER — OMEPRAZOLE MAGNESIUM 20 MG PO TBEC: 20.0000 mg | DELAYED_RELEASE_TABLET | Freq: Every day | ORAL | 3 refills | Status: DC

## 2021-04-24 MED ORDER — SHINGRIX 50 MCG/0.5ML IM SUSR: 0.5000 mL | Freq: Once | INTRAMUSCULAR | 0 refills | Status: AC

## 2021-04-24 NOTE — Patient Instructions (Addendum)
Health Maintenance, Female Adopting a healthy lifestyle and getting preventive care are important in promoting health and wellness. Ask your health care provider about: The right schedule for you to have regular tests and exams. Things you can do on your own to prevent diseases and keep yourself healthy. What should I know about diet, weight, and exercise? Eat a healthy diet  Eat a diet that includes plenty of vegetables, fruits, low-fat dairy products, and lean protein. Do not eat a lot of foods that are high in solid fats, added sugars, or sodium.  Maintain a healthy weight Body mass index (BMI) is used to identify weight problems. It estimates body fat based on height and weight. Your health care provider can help determineyour BMI and help you achieve or maintain a healthy weight. Get regular exercise Get regular exercise. This is one of the most important things you can do for your health. Most adults should: Exercise for at least 150 minutes each week. The exercise should increase your heart rate and make you sweat (moderate-intensity exercise). Do strengthening exercises at least twice a week. This is in addition to the moderate-intensity exercise. Spend less time sitting. Even light physical activity can be beneficial. Watch cholesterol and blood lipids Have your blood tested for lipids and cholesterol at 72 years of age, then havethis test every 5 years. Have your cholesterol levels checked more often if: Your lipid or cholesterol levels are high. You are older than 72 years of age. You are at high risk for heart disease. What should I know about cancer screening? Depending on your health history and family history, you may need to have cancer screening at various ages. This may include screening for: Breast cancer. Cervical cancer. Colorectal cancer. Skin cancer. Lung cancer. What should I know about heart disease, diabetes, and high blood pressure? Blood pressure and heart  disease High blood pressure causes heart disease and increases the risk of stroke. This is more likely to develop in people who have high blood pressure readings, are of African descent, or are overweight. Have your blood pressure checked: Every 3-5 years if you are 18-39 years of age. Every year if you are 40 years old or older. Diabetes Have regular diabetes screenings. This checks your fasting blood sugar level. Have the screening done: Once every three years after age 40 if you are at a normal weight and have a low risk for diabetes. More often and at a younger age if you are overweight or have a high risk for diabetes. What should I know about preventing infection? Hepatitis B If you have a higher risk for hepatitis B, you should be screened for this virus. Talk with your health care provider to find out if you are at risk forhepatitis B infection. Hepatitis C Testing is recommended for: Everyone born from 1945 through 1965. Anyone with known risk factors for hepatitis C. Sexually transmitted infections (STIs) Get screened for STIs, including gonorrhea and chlamydia, if: You are sexually active and are younger than 72 years of age. You are older than 72 years of age and your health care provider tells you that you are at risk for this type of infection. Your sexual activity has changed since you were last screened, and you are at increased risk for chlamydia or gonorrhea. Ask your health care provider if you are at risk. Ask your health care provider about whether you are at high risk for HIV. Your health care provider may recommend a prescription medicine to help   prevent HIV infection. If you choose to take medicine to prevent HIV, you should first get tested for HIV. You should then be tested every 3 months for as long as you are taking the medicine. Pregnancy If you are about to stop having your period (premenopausal) and you may become pregnant, seek counseling before you get  pregnant. Take 400 to 800 micrograms (mcg) of folic acid every day if you become pregnant. Ask for birth control (contraception) if you want to prevent pregnancy. Osteoporosis and menopause Osteoporosis is a disease in which the bones lose minerals and strength with aging. This can result in bone fractures. If you are 38 years old or older, or if you are at risk for osteoporosis and fractures, ask your health care provider if you should: Be screened for bone loss. Take a calcium or vitamin D supplement to lower your risk of fractures. Be given hormone replacement therapy (HRT) to treat symptoms of menopause. Follow these instructions at home: Lifestyle Do not use any products that contain nicotine or tobacco, such as cigarettes, e-cigarettes, and chewing tobacco. If you need help quitting, ask your health care provider. Do not use street drugs. Do not share needles. Ask your health care provider for help if you need support or information about quitting drugs. Alcohol use Do not drink alcohol if: Your health care provider tells you not to drink. You are pregnant, may be pregnant, or are planning to become pregnant. If you drink alcohol: Limit how much you use to 0-1 drink a day. Limit intake if you are breastfeeding. Be aware of how much alcohol is in your drink. In the [place] one drink equals one 12 oz bottle of beer (355 mL), one 5 oz glass of wine (148 mL), or one 1 oz glass of hard liquor (44 mL). General instructions Schedule regular health, dental, and eye exams. Stay current with your vaccines. Tell your health care provider if: You often feel depressed. You have ever been abused or do not feel safe at home. Summary Adopting a healthy lifestyle and getting preventive care are important in promoting health and wellness. Follow your health care provider's instructions about healthy diet, exercising, and getting tested or screened for diseases. Follow your health care provider's  instructions on monitoring your cholesterol and blood pressure. This information is not intended to replace advice given to you by your health care provider. Make sure you discuss any questions you have with your healthcare provider. Document Revised: 08/11/2018 Document Reviewed: 08/11/2018 Elsevier Patient Education  2022 [place].   Gastroesophageal Reflux Disease, Adult  Gastroesophageal reflux (GER) happens when acid from the stomach flows up into the tube that connects the mouth and the stomach (esophagus). Normally, food travels down the esophagus and stays in the stomach to be digested. With GER, food and stomach acid sometimes move back up into theesophagus. You may have a disease called gastroesophageal reflux disease (GERD) if the reflux: Happens often. Causes frequent or very bad symptoms. Causes problems such as damage to the esophagus. When this happens, the esophagus becomes sore and swollen. Over time, GERD can make small holes (ulcers) in the lining of the esophagus. What are the causes? This condition is caused by a problem with the muscle between the esophagus and the stomach. When this muscle is weak or not normal, it does not close properlyto keep food and acid from coming back up from the stomach. The muscle can be weak because of: Tobacco use. Pregnancy. Having a certain type  of hernia (hiatal hernia). Alcohol use. Certain foods and drinks, such as coffee, chocolate, onions, and peppermint. What increases the risk? Being overweight. Having a disease that affects your connective tissue. Taking NSAIDs, such a ibuprofen. What are the signs or symptoms? Heartburn. Difficult or painful swallowing. The feeling of having a lump in the throat. A bitter taste in the mouth. Bad breath. Having a lot of saliva. Having an upset or bloated stomach. Burping. Chest pain. Different conditions can cause chest pain. Make sure you see your doctor if you have chest  pain. Shortness of breath or wheezing. A long-term cough or a cough at night. Wearing away of the surface of teeth (tooth enamel). Weight loss. How is this treated? Making changes to your diet. Taking medicine. Having surgery. Treatment will depend on how bad your symptoms are. Follow these instructions at home: Eating and drinking  Follow a diet as told by your doctor. You may need to avoid foods and drinks such as: Coffee and tea, with or without caffeine. Drinks that contain alcohol. Energy drinks and sports drinks. Bubbly (carbonated) drinks or sodas. Chocolate and cocoa. Peppermint and mint flavorings. Garlic and onions. Horseradish. Spicy and acidic foods. These include peppers, chili powder, curry powder, vinegar, hot sauces, and BBQ sauce. Citrus fruit juices and citrus fruits, such as oranges, lemons, and limes. Tomato-based foods. These include red sauce, chili, salsa, and pizza with red sauce. Fried and fatty foods. These include donuts, french fries, potato chips, and high-fat dressings. High-fat meats. These include hot dogs, rib eye steak, sausage, ham, and bacon. High-fat dairy items, such as whole milk, butter, and cream cheese. Eat small meals often. Avoid eating large meals. Avoid drinking large amounts of liquid with your meals. Avoid eating meals during the 2-3 hours before bedtime. Avoid lying down right after you eat. Do not exercise right after you eat.  Lifestyle  Do not smoke or use any products that contain nicotine or tobacco. If you need help quitting, ask your doctor. Try to lower your stress. If you need help doing this, ask your doctor. If you are overweight, lose an amount of weight that is healthy for you. Ask your doctor about a safe weight loss goal.  General instructions Pay attention to any changes in your symptoms. Take over-the-counter and prescription medicines only as told by your doctor. Do not take aspirin, ibuprofen, or other  NSAIDs unless your doctor says it is okay. Wear loose clothes. Do not wear anything tight around your waist. Raise (elevate) the head of your bed about 6 inches (15 cm). You may need to use a wedge to do this. Avoid bending over if this makes your symptoms worse. Keep all follow-up visits. Contact a doctor if: You have new symptoms. You lose weight and you do not know why. You have trouble swallowing or it hurts to swallow. You have wheezing or a cough that keeps happening. You have a hoarse voice. Your symptoms do not get better with treatment. Get help right away if: You have sudden pain in your arms, neck, jaw, teeth, or back. You suddenly feel sweaty, dizzy, or light-headed. You have chest pain or shortness of breath. You vomit and the vomit is green, yellow, or black, or it looks like blood or coffee grounds. You faint. Your poop (stool) is red, bloody, or black. You cannot swallow, drink, or eat. These symptoms may represent a serious problem that is an emergency. Do not wait to see if the symptoms will go  away. Get medical help right away. Call your local emergency services (911 in the [place] Do not drive yourself to the hospital. Summary If a person has gastroesophageal reflux disease (GERD), food and stomach acid move back up into the esophagus and cause symptoms or problems such as damage to the esophagus. Treatment will depend on how bad your symptoms are. Follow a diet as told by your doctor. Take all medicines only as told by your doctor. This information is not intended to replace advice given to you by your health care provider. Make sure you discuss any questions you have with your healthcare provider. Document Revised: 02/27/2020 Document Reviewed: 02/27/2020 Elsevier Patient Education  2022 [place] for Gastroesophageal Reflux Disease, Adult When you have gastroesophageal reflux disease (GERD), the foods you eat and your eating habits are very  important. Choosing the right foods can help ease your discomfort. Think about working with a food expert (dietitian) to help you make good choices. What are tips for following this plan? Reading food labels Look for foods that are low in saturated fat. Foods that may help with your symptoms include: Foods that have less than 5% of daily value (DV) of fat. Foods that have 0 grams of trans fat. Cooking Do not fry your food. Cook your food by baking, steaming, grilling, or broiling. These are all methods that do not need a lot of fat for cooking. To add flavor, try to use herbs that are low in spice and acidity. Meal planning  Choose healthy foods that are low in fat, such as: Fruits and vegetables. Whole grains. Low-fat dairy products. Lean meats, fish, and poultry. Eat small meals often instead of eating 3 large meals each day. Eat your meals slowly in a place where you are relaxed. Avoid bending over or lying down until 2-3 hours after eating. Limit high-fat foods such as fatty meats or fried foods. Limit your intake of fatty foods, such as oils, butter, and shortening. Avoid the following as told by your doctor: Foods that cause symptoms. These may be different for different people. Keep a food diary to keep track of foods that cause symptoms. Alcohol. Drinking a lot of liquid with meals. Eating meals during the 2-3 hours before bed.  Lifestyle Stay at a healthy weight. Ask your doctor what weight is healthy for you. If you need to lose weight, work with your doctor to do so safely. Exercise for at least 30 minutes on 5 or more days each week, or as told by your doctor. Wear loose-fitting clothes. Do not smoke or use any products that contain nicotine or tobacco. If you need help quitting, ask your doctor. Sleep with the head of your bed higher than your feet. Use a wedge under the mattress or blocks under the bed frame to raise the head of the bed. Chew sugar-free gum after  meals. What foods should eat?  Eat a healthy, well-balanced diet of fruits, vegetables, whole grains, low-fatdairy products, lean meats, fish, and poultry. Each person is different. Foods that may cause symptoms in one person may not cause any symptoms inanother person. Work with your doctor to find foods that are safe for you. The items listed above may not be a complete list of what you can eat and drink. Contact a food expert for more options. What foods should I avoid? Limiting some of these foods may help in managing the symptoms of GERD. Everyone is different. Talk with a food expert  or your doctor to help you findthe exact foods to avoid, if any. Fruits Any fruits prepared with added fat. Any fruits that cause symptoms. For some people, this may include citrus fruits, such as oranges, grapefruit, pineapple,and lemons. Vegetables Deep-fried vegetables. [place] fries. Any vegetables prepared with added fat. Any vegetables that cause symptoms. For some people, this may include tomatoesand tomato products, chili peppers, onions and garlic, and horseradish. Grains Pastries or quick breads with added fat. Meats and other proteins High-fat meats, such as fatty beef or pork, hot dogs, ribs, ham, sausage, salami, and bacon. Fried meat or protein, including fried fish and friedchicken. Nuts and nut butters, in large amounts. Dairy Whole milk and chocolate milk. Sour cream. Cream. Ice cream. Cream cheese.Milkshakes. Fats and oils Butter. Margarine. Shortening. Ghee. Beverages Coffee and tea, with or without caffeine. Carbonated beverages. Sodas. Energy drinks. Fruit juice made with acidic fruits, such as orange or grapefruit.Tomato juice. Alcoholic drinks. Sweets and desserts Chocolate and cocoa. Donuts. Seasonings and condiments Pepper. Peppermint and spearmint. Added salt. Any condiments, herbs, or seasonings that cause symptoms. For some people, this may include curry, hotsauce, or  vinegar-based salad dressings. The items listed above may not be a complete list of what you should not eat and drink. Contact a food expert for more options. Questions to ask your doctor Diet and lifestyle changes are often the first steps that are taken to manage symptoms of GERD. If diet and lifestyle changes do not help, talk with yourdoctor about taking medicines. Where to find more information [employer]: aboutgerd.org Summary When you have GERD, food and lifestyle choices are very important in easing your symptoms. Eat small meals often instead of 3 large meals a day. Eat your meals slowly and in a place where you are relaxed. Avoid bending over or lying down until 2-3 hours after eating. Limit high-fat foods such as fatty meats or fried foods. This information is not intended to replace advice given to you by your health care provider. Make sure you discuss any questions you have with your healthcare provider. Document Revised: 02/27/2020 Document Reviewed: 02/27/2020 Elsevier Patient Education  [place].

## 2021-04-24 NOTE — Progress Notes (Signed)
I,[name],acting as a Education administrator for [employer], FNP.,have documented all relevant documentation on the behalf of Adrienne Brine, FNP,as directed by  Adrienne Brine, FNP while in the presence of Adrienne Lopez, [place].   This visit occurred during the SARS-CoV-2 public health emergency.  Safety protocols were in place, including screening questions prior to the visit, additional usage of staff PPE, and extensive cleaning of exam room while observing appropriate contact time as indicated for disinfecting solutions.  Subjective:     Patient ID: Adrienne Lopez , female    DOB: [birth year] , 70 y.o.   MRN: [id number]   Chief Complaint  Patient presents with   Annual Exam     HPI  The patient is here today for a physical examination. Continues to have problems with her stomach with reflux. When she went to her dentist for her cleaning she asked about reflux.     Past Medical History:  Diagnosis Date   Cancer [facility]) 1999   breast, left   Hyperparathyroidism, primary ([place])    Osteoporosis    TIA (transient ischemic attack) 12/26/2000   NO RESIDUAL PROBLEM     Family History  Problem Relation Age of Onset   Hypertension Mother    Prostate cancer Father    Heart attack Maternal Grandmother    Cancer Maternal Grandfather        oral cancer     Current Outpatient Medications:    Ascorbic Acid (VITAMIN C) 1000 MG tablet, Take 1,000 mg by mouth daily., Disp: , Rfl:    aspirin 81 MG tablet, Take 81 mg by mouth daily., Disp: , Rfl:    atenolol-chlorthalidone (TENORETIC) 100-25 MG tablet, TAKE 1/2 (ONE-HALF) TABLET BY MOUTH  DAILY, Disp: 45 tablet, Rfl: 3   atorvastatin (LIPITOR) 40 MG tablet, Take 1 tablet (40 mg total) by mouth daily., Disp: 90 tablet, Rfl: 3   B Complex-C (B-COMPLEX WITH VITAMIN C) tablet, Take 1 tablet by mouth daily. 144m, Disp: , Rfl:    BIOTIN 5000 PO, Take 1 tablet by mouth daily. , Disp: , Rfl:    cholecalciferol (VITAMIN D) 1000 UNITS tablet, Take 5,000 Units by  mouth daily., Disp: , Rfl:    Cyanocobalamin (VITAMIN B-12 PO), Take 2,500 mg by mouth daily., Disp: , Rfl:    Multiple Vitamin (MULTIVITAMIN) capsule, Take 1 capsule by mouth daily., Disp: , Rfl:    omeprazole (PRILOSEC OTC) 20 MG tablet, Take 1 tablet (20 mg total) by mouth daily., Disp: 30 tablet, Rfl: 3   Zoster Vaccine Adjuvanted (SHINGRIX) injection, Inject 0.5 mLs into the muscle once for 1 dose., Disp: 0.5 mL, Rfl: 0   No Known Allergies    The patient states she uses status post hysterectomy for birth control.  No LMP recorded. Patient has had a hysterectomy.. Negative for Dysmenorrhea and Negative for Menorrhagia. Negative for: breast discharge, breast lump(s), breast pain and breast self exam. Associated symptoms include abnormal vaginal bleeding. Pertinent negatives include abnormal bleeding (hematology), anxiety, decreased libido, depression, difficulty falling sleep, dyspareunia, history of infertility, nocturia, sexual dysfunction, sleep disturbances, urinary incontinence, urinary urgency, vaginal discharge and vaginal itching. Diet regular.The patient states her exercise level is minimal will do stretches.   The patient's tobacco use is:  Social History   Tobacco Use  Smoking Status Never  Smokeless Tobacco Never  . She has been exposed to passive smoke. The patient's alcohol use is:  Social History   Substance and Sexual Activity  Alcohol Use No  Review of Systems  Constitutional: Negative.   HENT: Negative.    Eyes: Negative.   Respiratory: Negative.    Cardiovascular: Negative.  Negative for chest pain, palpitations and leg swelling.  Gastrointestinal: Negative.        Feels like the food is backing up and will have belching  Endocrine: Negative.   Genitourinary: Negative.   Musculoskeletal: Negative.   Skin: Negative.   Allergic/Immunologic: Negative.   Neurological: Negative.   Hematological: Negative.   Psychiatric/Behavioral: Negative.      Today's  Vitals   04/24/21 0917 04/24/21 0946  BP: (!) 146/90 126/84  Pulse: 65   Temp: (!) 97.5 F (36.4 C)   TempSrc: Oral   Weight: 139 lb 9.6 oz (63.3 kg)   Height: 5' 3"  (1.6 m)    Body mass index is 24.73 kg/m.  Wt Readings from Last 3 Encounters:  04/24/21 139 lb 9.6 oz (63.3 kg)  01/10/21 136 lb (61.7 kg)  11/19/20 144 lb 9.6 oz (65.6 kg)    BP Readings from Last 3 Encounters:  04/24/21 126/84  01/10/21 124/80  11/19/20 126/84    Objective:  Physical Exam Vitals reviewed.  Constitutional:      General: She is not in acute distress.    Appearance: Normal appearance. She is well-developed. She is obese.  HENT:     Head: Normocephalic and atraumatic.     Right Ear: Hearing, tympanic membrane, ear canal and external ear normal. There is no impacted cerumen.     Left Ear: Hearing, tympanic membrane, ear canal and external ear normal. There is no impacted cerumen.     Nose:     Comments: Deferred - masked    Mouth/Throat:     Comments: Deferred - masked Eyes:     General: Lids are normal.     Extraocular Movements: Extraocular movements intact.     Conjunctiva/sclera: Conjunctivae normal.     Pupils: Pupils are equal, round, and reactive to light.     Funduscopic exam:    Right eye: No papilledema.        Left eye: No papilledema.  Neck:     Thyroid: No thyroid mass.     Vascular: No carotid bruit.  Cardiovascular:     Rate and Rhythm: Normal rate and regular rhythm.     Pulses: Normal pulses.     Heart sounds: Normal heart sounds. No murmur heard. Pulmonary:     Effort: Pulmonary effort is normal. No respiratory distress.     Breath sounds: Normal breath sounds. No wheezing.  Chest:     Chest wall: No mass.  Breasts:    Tanner Score is 5.     Right: Normal. No mass or tenderness.     Left: Normal. No mass or tenderness.  Abdominal:     General: Abdomen is flat. Bowel sounds are normal. There is no distension.     Palpations: Abdomen is soft.     Tenderness:  There is no abdominal tenderness.  Genitourinary:    Rectum: Guaiac result negative.  Musculoskeletal:        General: No swelling. Normal range of motion.     Cervical back: Full passive range of motion without pain, normal range of motion and neck supple.     Right lower leg: No edema.     Left lower leg: No edema.  Lymphadenopathy:     Upper Body:     Right upper body: No supraclavicular, axillary or pectoral adenopathy.  Left upper body: No supraclavicular, axillary or pectoral adenopathy.  Skin:    General: Skin is warm and dry.     Capillary Refill: Capillary refill takes less than 2 seconds.  Neurological:     General: No focal deficit present.     Mental Status: She is alert and oriented to person, place, and time.     Cranial Nerves: No cranial nerve deficit.     Sensory: No sensory deficit.     Motor: No weakness.  Psychiatric:        Mood and Affect: Mood normal.        Behavior: Behavior normal.        Thought Content: Thought content normal.        Judgment: Judgment normal.        Assessment And Plan:     1. Routine general medical examination at a health care facility Behavior modifications discussed and diet history reviewed.   Pt will continue to exercise regularly and modify diet with low GI, plant based foods and decrease intake of processed foods.  Recommend intake of daily multivitamin, Vitamin D, and calcium.  Recommend mammogram and colonoscopy for preventive screenings, as well as recommend immunizations that include influenza, TDAP, and Shingles (Rx's sent to pharmacy) - CBC  2. Essential hypertension Comments: Recheck blood pressure is normal.  Continue current medications - POCT Urinalysis Dipstick (81002) - POCT UA - Microalbumin - Lipid panel - CMP14+EGFR  3. Gastroesophageal reflux disease without esophagitis Comments: Will try her on omeprazole for 2 weeks if better can stop Take probiotic daily and avoid food triggers  4. Impacted  cerumen of left ear Comments: Water lavage done with good results - Ear Lavage  5. Encounter for immunization - Zoster Vaccine Adjuvanted [facility]) injection; Inject 0.5 mLs into the muscle once for 1 dose.  Dispense: 0.5 mL; Refill: 0    Patient was given opportunity to ask questions. Patient verbalized understanding of the plan and was able to repeat key elements of the plan. All questions were answered to their satisfaction.   Adrienne Brine, FNP   I, Adrienne Brine, FNP, have reviewed all documentation for this visit. The documentation on 04/24/21 for the exam, diagnosis, procedures, and orders are all accurate and complete.   THE PATIENT IS ENCOURAGED TO PRACTICE SOCIAL DISTANCING DUE TO THE COVID-19 PANDEMIC.

## 2021-04-25 ENCOUNTER — Other Ambulatory Visit: Payer: Self-pay

## 2021-04-25 MED ORDER — OMEPRAZOLE 20 MG PO CPDR
20.0000 mg | DELAYED_RELEASE_CAPSULE | Freq: Every day | ORAL | 3 refills | Status: DC
Start: 2021-04-25 — End: 2021-10-09

## 2021-04-29 ENCOUNTER — Encounter: Payer: Self-pay | Admitting: Nurse Practitioner

## 2021-05-02 ENCOUNTER — Encounter: Payer: Self-pay | Admitting: Nurse Practitioner

## 2021-05-13 ENCOUNTER — Other Ambulatory Visit: Payer: Self-pay | Admitting: Nurse Practitioner

## 2021-05-13 DIAGNOSIS — K219 Gastro-esophageal reflux disease without esophagitis: Secondary | ICD-10-CM

## 2021-05-16 ENCOUNTER — Other Ambulatory Visit: Payer: Self-pay

## 2021-05-16 ENCOUNTER — Ambulatory Visit (INDEPENDENT_AMBULATORY_CARE_PROVIDER_SITE_OTHER): Payer: Medicare (Managed Care)

## 2021-05-16 VITALS — BP 130/80 | HR 60 | Temp 98.4°F | Ht 63.6 in | Wt 141.8 lb

## 2021-05-16 DIAGNOSIS — Z0000 Encounter for general adult medical examination without abnormal findings: Secondary | ICD-10-CM | POA: Diagnosis not present

## 2021-05-16 NOTE — Patient Instructions (Signed)
Adrienne Lopez , Thank you for taking time to come for your Medicare Wellness Visit. I appreciate your ongoing commitment to your health goals. Please review the following plan we discussed and let me know if I can assist you in the future.   Screening recommendations/referrals: Colonoscopy: completed 04/19/2018 Mammogram: completed 08/30/2020 Bone Density: completed 06/25/2020 Recommended yearly ophthalmology/optometry visit for glaucoma screening and checkup Recommended yearly dental visit for hygiene and checkup  Vaccinations: Influenza vaccine: decline Pneumococcal vaccine: completed 05/02/2021 Tdap vaccine: completed 05/18/2020, due 05/18/2030 Shingles vaccine: discussed   Covid-19: 07/14/2020, 11/19/2019, 10/31/2019  Advanced directives: Advance directive discussed with you today. Even though you declined this today please call our office should you change your mind and we can give you the proper paperwork for you to fill out.  Conditions/risks identified: none  Next appointment: Follow up in one year for your annual wellness visit    Preventive Care 65 Years and Older, Female Preventive care refers to lifestyle choices and visits with your health care provider that can promote health and wellness. What does preventive care include? A yearly physical exam. This is also called an annual well check. Dental exams once or twice a year. Routine eye exams. Ask your health care provider how often you should have your eyes checked. Personal lifestyle choices, including: Daily care of your teeth and gums. Regular physical activity. Eating a healthy diet. Avoiding tobacco and drug use. Limiting alcohol use. Practicing safe sex. Taking low-dose aspirin every day. Taking vitamin and mineral supplements as recommended by your health care provider. What happens during an annual well check? The services and screenings done by your health care provider during your annual well check will depend on  your age, overall health, lifestyle risk factors, and family history of disease. Counseling  Your health care provider may ask you questions about your: Alcohol use. Tobacco use. Drug use. Emotional well-being. Home and relationship well-being. Sexual activity. Eating habits. History of falls. Memory and ability to understand (cognition). Work and work Statistician. Reproductive health. Screening  You may have the following tests or measurements: Height, weight, and BMI. Blood pressure. Lipid and cholesterol levels. These may be checked every 5 years, or more frequently if you are over 30 years old. Skin check. Lung cancer screening. You may have this screening every year starting at age 27 if you have a 30-pack-year history of smoking and currently smoke or have quit within the past 15 years. Fecal occult blood test (FOBT) of the stool. You may have this test every year starting at age 34. Flexible sigmoidoscopy or colonoscopy. You may have a sigmoidoscopy every 5 years or a colonoscopy every 10 years starting at age 88. Hepatitis C blood test. Hepatitis B blood test. Sexually transmitted disease (STD) testing. Diabetes screening. This is done by checking your blood sugar (glucose) after you have not eaten for a while (fasting). You may have this done every 1-3 years. Bone density scan. This is done to screen for osteoporosis. You may have this done starting at age 56. Mammogram. This may be done every 1-2 years. Talk to your health care provider about how often you should have regular mammograms. Talk with your health care provider about your test results, treatment options, and if necessary, the need for more tests. Vaccines  Your health care provider may recommend certain vaccines, such as: Influenza vaccine. This is recommended every year. Tetanus, diphtheria, and acellular pertussis (Tdap, Td) vaccine. You may need a Td booster every 10 years. Zoster  vaccine. You may need this  after age 1. Pneumococcal 13-valent conjugate (PCV13) vaccine. One dose is recommended after age 16. Pneumococcal polysaccharide (PPSV23) vaccine. One dose is recommended after age 41. Talk to your health care provider about which screenings and vaccines you need and how often you need them. This information is not intended to replace advice given to you by your health care provider. Make sure you discuss any questions you have with your health care provider. Document Released: 09/14/2015 Document Revised: 05/07/2016 Document Reviewed: 06/19/2015 Elsevier Interactive Patient Education  2017 [place] Prevention in the Home Falls can cause injuries. They can happen to people of all ages. There are many things you can do to make your home safe and to help prevent falls. What can I do on the outside of my home? Regularly fix the edges of walkways and driveways and fix any cracks. Remove anything that might make you trip as you walk through a door, such as a raised step or threshold. Trim any bushes or trees on the path to your home. Use bright outdoor lighting. Clear any walking paths of anything that might make someone trip, such as rocks or tools. Regularly check to see if handrails are loose or broken. Make sure that both sides of any steps have handrails. Any raised decks and porches should have guardrails on the edges. Have any leaves, snow, or ice cleared regularly. Use sand or salt on walking paths during winter. Clean up any spills in your garage right away. This includes oil or grease spills. What can I do in the bathroom? Use night lights. Install grab bars by the toilet and in the tub and shower. Do not use towel bars as grab bars. Use non-skid mats or decals in the tub or shower. If you need to sit down in the shower, use a plastic, non-slip stool. Keep the floor dry. Clean up any water that spills on the floor as soon as it happens. Remove soap buildup in the tub or  shower regularly. Attach bath mats securely with double-sided non-slip rug tape. Do not have throw rugs and other things on the floor that can make you trip. What can I do in the bedroom? Use night lights. Make sure that you have a light by your bed that is easy to reach. Do not use any sheets or blankets that are too big for your bed. They should not hang down onto the floor. Have a firm chair that has side arms. You can use this for support while you get dressed. Do not have throw rugs and other things on the floor that can make you trip. What can I do in the kitchen? Clean up any spills right away. Avoid walking on wet floors. Keep items that you use a lot in easy-to-reach places. If you need to reach something above you, use a strong step stool that has a grab bar. Keep electrical cords out of the way. Do not use floor polish or wax that makes floors slippery. If you must use wax, use non-skid floor wax. Do not have throw rugs and other things on the floor that can make you trip. What can I do with my stairs? Do not leave any items on the stairs. Make sure that there are handrails on both sides of the stairs and use them. Fix handrails that are broken or loose. Make sure that handrails are as long as the stairways. Check any carpeting to make sure that it  is firmly attached to the stairs. Fix any carpet that is loose or worn. Avoid having throw rugs at the top or bottom of the stairs. If you do have throw rugs, attach them to the floor with carpet tape. Make sure that you have a light switch at the top of the stairs and the bottom of the stairs. If you do not have them, ask someone to add them for you. What else can I do to help prevent falls? Wear shoes that: Do not have high heels. Have rubber bottoms. Are comfortable and fit you well. Are closed at the toe. Do not wear sandals. If you use a stepladder: Make sure that it is fully opened. Do not climb a closed stepladder. Make  sure that both sides of the stepladder are locked into place. Ask someone to hold it for you, if possible. Clearly mark and make sure that you can see: Any grab bars or handrails. First and last steps. Where the edge of each step is. Use tools that help you move around (mobility aids) if they are needed. These include: Canes. Walkers. Scooters. Crutches. Turn on the lights when you go into a dark area. Replace any light bulbs as soon as they burn out. Set up your furniture so you have a clear path. Avoid moving your furniture around. If any of your floors are uneven, fix them. If there are any pets around you, be aware of where they are. Review your medicines with your doctor. Some medicines can make you feel dizzy. This can increase your chance of falling. Ask your doctor what other things that you can do to help prevent falls. This information is not intended to replace advice given to you by your health care provider. Make sure you discuss any questions you have with your health care provider. Document Released: 06/14/2009 Document Revised: 01/24/2016 Document Reviewed: 09/22/2014 Elsevier Interactive Patient Education  2017 [employer].

## 2021-05-16 NOTE — Progress Notes (Signed)
This visit occurred during the SARS-CoV-2 public health emergency.  Safety protocols were in place, including screening questions prior to the visit, additional usage of staff PPE, and extensive cleaning of exam room while observing appropriate contact time as indicated for disinfecting solutions.  Subjective:   Adrienne Lopez is a 70 y.o. female who presents for an Initial Medicare Annual Wellness Visit.  Review of Systems     Cardiac Risk Factors include: advanced age (>7mn, >>63women);hypertension;sedentary lifestyle     Objective:    Today's Vitals   05/16/21 0833 05/16/21 0858  BP: (!) 150/80 130/80  Pulse: 60   Temp: 98.4 F (36.9 C)   TempSrc: Oral   Weight: 141 lb 12.8 oz (64.3 kg)   Height: 5' 3.6" (1.615 m)    Body mass index is 24.65 kg/m.  Advanced Directives 05/16/2021 04/18/2020 07/08/2013 04/29/2013  Does Patient Have a Medical Advance Directive? No No Patient would not like information;Patient does not have advance directive Patient does not have advance directive;Patient would not like information  Would patient like information on creating a medical advance directive? No - Patient declined Yes (MAU/Ambulatory/Procedural Areas - Information given) - -  Pre-existing out of facility DNR order (yellow form or pink MOST form) - - No -    Current Medications (verified) Outpatient Encounter Medications as of 05/16/2021  Medication Sig   Ascorbic Acid (VITAMIN C) 1000 MG tablet Take 1,000 mg by mouth daily.   aspirin 81 MG tablet Take 81 mg by mouth daily.   atenolol-chlorthalidone (TENORETIC) 100-25 MG tablet TAKE 1/2 (ONE-HALF) TABLET BY MOUTH  DAILY   atorvastatin (LIPITOR) 40 MG tablet Take 1 tablet (40 mg total) by mouth daily.   B Complex-C (B-COMPLEX WITH VITAMIN C) tablet Take 1 tablet by mouth daily. '100mg'$    BIOTIN 5000 PO Take 1 tablet by mouth daily.    cholecalciferol (VITAMIN D) 1000 UNITS tablet Take 5,000 Units by mouth daily.   Cyanocobalamin (VITAMIN  B-12 PO) Take 2,500 mg by mouth daily.   Multiple Vitamin (MULTIVITAMIN) capsule Take 1 capsule by mouth daily.   Probiotic Product (PROBIOTIC DAILY PO) Take 1 tablet by mouth daily.   omeprazole (PRILOSEC) 20 MG capsule Take 1 capsule (20 mg total) by mouth daily. (Patient not taking: Reported on 05/16/2021)   No facility-administered encounter medications on file as of 05/16/2021.    Allergies (verified) Patient has no known allergies.   History: Past Medical History:  Diagnosis Date   [place] [place]   breast, left   [place]    Osteoporosis    TIA (transient ischemic attack) 12/26/2000   NO RESIDUAL PROBLEM   Past Surgical History:  Procedure Laterality Date   ABDOMINAL HYSTERECTOMY     BREAST LUMPECTOMY  1999   left   PARATHYROIDECTOMY N/A 05/06/2013   Procedure: PARATHYROIDECTOMY;  Surgeon: TEarnstine Regal MD;  Location: [facility];  Service: General;  Laterality: N/A;   PARTIAL HYSTERECTOMY  1991   Family History  Problem Relation Age of Onset   Hypertension Mother    Prostate cancer Father    Heart attack Maternal Grandmother    Cancer Maternal Grandfather        oral cancer   Social History   Socioeconomic History   Marital status: Divorced    Spouse name: Not on file   Number of children: Not on file   Years of education: Not on file   Highest education level: Not on file  Occupational History  Not on file  Tobacco Use   Smoking status: Never   Smokeless tobacco: Never  Vaping Use   Vaping Use: Never used  Substance and Sexual Activity   Alcohol use: No   Drug use: No   Sexual activity: Not Currently  Other Topics Concern   Not on file  Social History Narrative   Not on file   Social Determinants of Health   Financial Resource Strain: Low Risk    Difficulty of Paying Living Expenses: Not hard at all  Food Insecurity: No Food Insecurity   Worried About Charity fundraiser in the Last Year: Never true   [place] in  the Last Year: Never true  Transportation Needs: No Transportation Needs   Lack of Transportation (Medical): No   Lack of Transportation (Non-Medical): No  Physical Activity: Inactive   Days of Exercise per Week: 0 days   Minutes of Exercise per Session: 0 min  Stress: No Stress Concern Present   Feeling of Stress : Not at all  Social Connections: Not on file    Tobacco Counseling Counseling given: Not Answered   Clinical Intake:  Pre-visit preparation completed: Yes  Pain : No/denies pain     Nutritional Status: BMI of 19-24  Normal Nutritional Risks: None Diabetes: No  How often do you need to have someone help you when you read instructions, pamphlets, or other written materials from your doctor or pharmacy?: 1 - Never What is the last grade level you completed in school?: 12th grade  Diabetic? no  Interpreter Needed?: No  Information entered by :: NAllen LPN   Activities of Daily Living In your present state of health, do you have any difficulty performing the following activities: 05/16/2021 04/24/2021  Hearing? N N  Vision? N N  Difficulty concentrating or making decisions? N N  Walking or climbing stairs? N N  Dressing or bathing? N N  Doing errands, shopping? N N  Preparing Food and eating ? N -  Using the Toilet? N -  In the past six months, have you accidently leaked urine? N -  Do you have problems with loss of bowel control? N -  Managing your Medications? N -  Managing your Finances? N -  Housekeeping or managing your Housekeeping? N -  Some recent data might be hidden    Patient Care Team: [name], FNP as PCP - General ([place]) Debara Pickett Nadean Corwin, MD as PCP - Cardiology (Cardiology)  Indicate any recent Medical Services you may have received from other than [facility] providers in the past year (date may be approximate).     Assessment:   This is a routine wellness examination for Adrienne Lopez.  Hearing/Vision screen Vision Screening -  Comments:: Regular eye exams, Dr.Whitaker  Dietary issues and exercise activities discussed: Current Exercise Habits: The patient does not participate in regular exercise at present   Goals Addressed             This Visit's Progress    Patient Stated       05/16/2021, no goals       Depression Screen PHQ 2/9 Scores 05/16/2021 04/24/2021 04/18/2020  PHQ - 2 Score 0 0 0    Fall Risk Fall Risk  05/16/2021 04/24/2021 04/18/2020  Falls in the past year? 0 0 0  Number falls in past yr: - 0 -  Injury with Fall? - 0 -  Risk for fall due to : Medication side effect - -  Follow up Falls evaluation completed;Education provided;Falls prevention discussed - -    FALL RISK PREVENTION PERTAINING TO THE HOME:  Any stairs in or around the home? Yes  If so, are there any without handrails? No  Home free of loose throw rugs in walkways, pet beds, electrical cords, etc? Yes  Adequate lighting in your home to reduce risk of falls? Yes   ASSISTIVE DEVICES UTILIZED TO PREVENT FALLS:  Life alert? No  Use of a cane, walker or w/c? No  Grab bars in the bathroom? No  Shower chair or bench in shower? No  Elevated toilet seat or a handicapped toilet? No   TIMED UP AND GO:  Was the test performed? No .     Gait steady and fast without use of assistive device  Cognitive Function:     6CIT Screen 05/16/2021 04/18/2020  What Year? 0 points 0 points  What month? 0 points 0 points  What time? 0 points 3 points  Count back from 20 0 points 0 points  Months in reverse 0 points 0 points  Repeat phrase 0 points 0 points  Total Score 0 3    Immunizations Immunization History  Administered Date(s) Administered   PFIZER(Purple Top)SARS-COV-2 Vaccination 10/31/2019, 11/29/2019, 07/14/2020   Pneumococcal-Unspecified 04/28/2020, 05/02/2021   Tdap 05/18/2020    TDAP status: Up to date  Flu Vaccine status: Declined, Education has been provided regarding the importance of this vaccine but patient  still declined. Advised may receive this vaccine at local pharmacy or Health Dept. Aware to provide a copy of the vaccination record if obtained from local pharmacy or Health Dept. Verbalized acceptance and understanding.  Pneumococcal vaccine status: Up to date  Covid-19 vaccine status: Completed vaccines  Qualifies for Shingles Vaccine? Yes   Zostavax completed No   Shingrix Completed?: No.    Education has been provided regarding the importance of this vaccine. Patient has been advised to call insurance company to determine out of pocket expense if they have not yet received this vaccine. Advised may also receive vaccine at local pharmacy or Health Dept. Verbalized acceptance and understanding.  Screening Tests Health Maintenance  Topic Date Due   COVID-19 Vaccine (4 - Booster for Pfizer series) 10/06/2020   Zoster Vaccines- Shingrix (1 of 2) 07/25/2021 (Originally 03/19/1970)   INFLUENZA VACCINE  11/29/2021 (Originally 04/01/2021)   MAMMOGRAM  08/30/2021   PNA vac Low Risk Adult (2 of 2 - PCV13) 05/02/2022   COLONOSCOPY (Pts 45-16yr Insurance coverage will need to be confirmed)  04/19/2028   TETANUS/TDAP  05/18/2030   DEXA SCAN  Completed   Hepatitis C Screening  Completed   HPV VACCINES  Aged Out    Health Maintenance  Health Maintenance Due  Topic Date Due   COVID-19 Vaccine (4 - Booster for PEsteroseries) 10/06/2020    Colorectal cancer screening: Type of screening: Colonoscopy. Completed 04/19/2018. Repeat every 5 years  Mammogram status: Completed 08/30/2020. Repeat every year  Bone Density status: Completed 06/25/2020.  Lung Cancer Screening: (Low Dose CT Chest recommended if Age 72-80years, 30 pack-year currently smoking OR have quit w/in 15years.) does not qualify.   Lung Cancer Screening Referral: no  Additional Screening:  Hepatitis C Screening: does qualify; Completed 04/18/2020  Vision Screening: Recommended annual ophthalmology exams for early detection of  glaucoma and other disorders of the eye. Is the patient up to date with their annual eye exam?  Yes  Who is the provider or what is the name of the  office in which the patient attends annual eye exams? Dr. Venetia Maxon If pt is not established with a provider, would they like to be referred to a provider to establish care? No .   Dental Screening: Recommended annual dental exams for proper oral hygiene  Community Resource Referral / Chronic Care Management: CRR required this visit?  No   CCM required this visit?  No      Plan:     I have personally reviewed and noted the following in the patient's chart:   Medical and social history Use of alcohol, tobacco or illicit drugs  Current medications and supplements including opioid prescriptions. Patient is not currently taking opioid prescriptions. Functional ability and status Nutritional status Physical activity Advanced directives List of other physicians Hospitalizations, surgeries, and ER visits in previous 12 months Vitals Screenings to include cognitive, depression, and falls Referrals and appointments  In addition, I have reviewed and discussed with patient certain preventive protocols, quality metrics, and best practice recommendations. A written personalized care plan for preventive services as well as general preventive health recommendations were provided to patient.     Kellie Simmering, LPN   [id number]   Nurse Notes:

## 2021-05-20 ENCOUNTER — Encounter: Payer: Self-pay | Admitting: Nurse Practitioner

## 2021-05-23 ENCOUNTER — Encounter: Payer: Self-pay | Admitting: Nurse Practitioner

## 2021-06-04 ENCOUNTER — Encounter: Payer: Self-pay | Admitting: Nurse Practitioner

## 2021-06-04 ENCOUNTER — Ambulatory Visit: Payer: Medicare (Managed Care) | Admitting: Nurse Practitioner

## 2021-06-04 ENCOUNTER — Other Ambulatory Visit: Payer: Self-pay

## 2021-06-04 VITALS — BP 120/78 | HR 82 | Temp 98.2°F | Ht 63.6 in | Wt 138.8 lb

## 2021-06-04 DIAGNOSIS — D696 Thrombocytopenia, unspecified: Secondary | ICD-10-CM

## 2021-06-04 DIAGNOSIS — R81 Glycosuria: Secondary | ICD-10-CM | POA: Diagnosis not present

## 2021-06-04 LAB — POCT URINALYSIS DIPSTICK
Bilirubin, UA: NEGATIVE
Blood, UA: NEGATIVE
Glucose, UA: NEGATIVE
Ketones, UA: NEGATIVE
Leukocytes, UA: NEGATIVE
Nitrite, UA: NEGATIVE
Protein, UA: NEGATIVE
Spec Grav, UA: 1.02 (ref 1.010–1.025)
Urobilinogen, UA: 0.2 E.U./dL
pH, UA: 7 (ref 5.0–8.0)

## 2021-06-04 LAB — CBC
Hematocrit: 41.7 % (ref 34.0–46.6)
Hemoglobin: 13.9 g/dL (ref 11.1–15.9)
MCH: 30.3 pg (ref 26.6–33.0)
MCHC: 33.3 g/dL (ref 31.5–35.7)
MCV: 91 fL (ref 79–97)
Platelets: 158 10*3/uL (ref 150–450)
RBC: 4.58 x10E6/uL (ref 3.77–5.28)
RDW: 13.5 % (ref 11.7–15.4)
WBC: 5.4 10*3/uL (ref 3.4–10.8)

## 2021-06-04 LAB — HEMOGLOBIN A1C
Est. average glucose Bld gHb Est-mCnc: 123 mg/dL
Hgb A1c MFr Bld: 5.9 % — ABNORMAL HIGH (ref 4.8–5.6)

## 2021-06-04 NOTE — Progress Notes (Signed)
I,[name],acting as a Education administrator for [employer], FNP.,have documented all relevant documentation on the behalf of [name], FNP,as directed by  [name], FNP while in the presence of [name], [place].   This visit occurred during the SARS-CoV-2 public health emergency.  Safety protocols were in place, including screening questions prior to the visit, additional usage of staff PPE, and extensive cleaning of exam room while observing appropriate contact time as indicated for disinfecting solutions.  Subjective:     Patient ID: Adrienne Lopez , female    DOB: 31-Jul-1951 , 70 y.o.   MRN: [id number]   Chief Complaint  Patient presents with   discuss labs    HPI  The patient is here to discuss her last lab results. She has tried to cut back on her chocolate intake and chewing gum She had seen an Endocrinologist for prediabetes in the past and her parathyroid.   Gastroesophageal Reflux This is a chronic problem. Past procedures do not include an abdominal ultrasound.    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, left   Hyperparathyroidism, primary ([place])    Osteoporosis    TIA (transient ischemic attack) 12/26/2000   NO RESIDUAL PROBLEM     Family History  Problem Relation Age of Onset   Hypertension Mother    Prostate cancer Father    Heart attack Maternal Grandmother    Cancer Maternal Grandfather        oral cancer     Current Outpatient Medications:    Ascorbic Acid (VITAMIN C) 1000 MG tablet, Take 1,000 mg by mouth daily., Disp: , Rfl:    aspirin 81 MG tablet, Take 81 mg by mouth daily., Disp: , Rfl:    atenolol-chlorthalidone (TENORETIC) 100-25 MG tablet, TAKE 1/2 (ONE-HALF) TABLET BY MOUTH  DAILY, Disp: 45 tablet, Rfl: 3   atorvastatin (LIPITOR) 40 MG tablet, Take 1 tablet (40 mg total) by mouth daily., Disp: 90 tablet, Rfl: 3   B Complex-C (B-COMPLEX WITH VITAMIN C) tablet, Take 1 tablet by mouth daily. 100mg , Disp: , Rfl:    BIOTIN  5000 PO, Take 1 tablet by mouth daily. , Disp: , Rfl:    cholecalciferol (VITAMIN D) 1000 UNITS tablet, Take 5,000 Units by mouth daily., Disp: , Rfl:    Cyanocobalamin (VITAMIN B-12 PO), Take 2,500 mg by mouth daily., Disp: , Rfl:    Multiple Vitamin (MULTIVITAMIN) capsule, Take 1 capsule by mouth daily., Disp: , Rfl:    Probiotic Product (PROBIOTIC DAILY PO), Take 1 tablet by mouth daily., Disp: , Rfl:    omeprazole (PRILOSEC) 20 MG capsule, Take 1 capsule (20 mg total) by mouth daily. (Patient not taking: No sig reported), Disp: 30 capsule, Rfl: 3   No Known Allergies   Review of Systems  Constitutional: Negative.   Respiratory: Negative.    Cardiovascular: Negative.   Gastrointestinal: Negative.   Psychiatric/Behavioral: Negative.    All other systems reviewed and are negative.   Today's Vitals   06/04/21 1555  BP: 120/78  Pulse: 82  Temp: 98.2 F (36.8 C)  Weight: 138 lb 12.8 oz (63 kg)  Height: 5' 3.6" (1.615 m)  PainSc: 0-No pain   Body mass index is 24.13 kg/m.  Wt Readings from Last 3 Encounters:  06/04/21 138 lb 12.8 oz (63 kg)  05/16/21 141 lb 12.8 oz (64.3 kg)  04/24/21 139 lb 9.6 oz (63.3 kg)    BP Readings from Last 3 Encounters:  06/04/21  120/78  05/16/21 130/80  04/24/21 126/84   t Objective:  Physical Exam Vitals reviewed.  Constitutional:      General: She is not in acute distress.    Appearance: Normal appearance.  Pulmonary:     Effort: Pulmonary effort is normal. No respiratory distress.  Neurological:     General: No focal deficit present.     Mental Status: She is alert and oriented to person, place, and time.     Cranial Nerves: No cranial nerve deficit.     Motor: No weakness.  Psychiatric:        Mood and Affect: Mood normal.        Behavior: Behavior normal.        Thought Content: Thought content normal.        Judgment: Judgment normal.        Assessment And Plan:     1. Glucosuria Comments: Has positive glucose in urine last  visit, none this visit, will check HgbA1c - CBC - Hemoglobin A1c - POCT Urinalysis Dipstick (81002)  2. Low platelet count (HCC) Comments: Will recheck levels. Denies abnormal bleeding    Patient was given opportunity to ask questions. Patient verbalized understanding of the plan and was able to repeat key elements of the plan. All questions were answered to their satisfaction.  [name], FNP   I, [name], FNP, have reviewed all documentation for this visit. The documentation on 06/04/21 for the exam, diagnosis, procedures, and orders are all accurate and complete.   IF YOU HAVE BEEN REFERRED TO A SPECIALIST, IT MAY TAKE 1-2 WEEKS TO SCHEDULE/PROCESS THE REFERRAL. IF YOU HAVE NOT HEARD FROM US/SPECIALIST IN TWO WEEKS, PLEASE GIVE [place] A CALL AT [phone] X [phone].   THE PATIENT IS ENCOURAGED TO PRACTICE SOCIAL DISTANCING DUE TO THE COVID-19 PANDEMIC.

## 2021-06-14 ENCOUNTER — Encounter: Payer: Self-pay | Admitting: Nurse Practitioner

## 2021-08-28 ENCOUNTER — Encounter: Payer: Self-pay | Admitting: Nurse Practitioner

## 2021-09-10 DIAGNOSIS — H2513 Age-related nuclear cataract, bilateral: Secondary | ICD-10-CM | POA: Diagnosis not present

## 2021-09-10 DIAGNOSIS — H43813 Vitreous degeneration, bilateral: Secondary | ICD-10-CM | POA: Diagnosis not present

## 2021-09-17 ENCOUNTER — Encounter: Payer: Self-pay | Admitting: Nurse Practitioner

## 2021-09-17 DIAGNOSIS — Z1231 Encounter for screening mammogram for malignant neoplasm of breast: Secondary | ICD-10-CM | POA: Diagnosis not present

## 2021-10-09 ENCOUNTER — Ambulatory Visit (INDEPENDENT_AMBULATORY_CARE_PROVIDER_SITE_OTHER): Payer: Medicare Other | Admitting: Nurse Practitioner

## 2021-10-09 ENCOUNTER — Other Ambulatory Visit: Payer: Self-pay

## 2021-10-09 ENCOUNTER — Encounter: Payer: Self-pay | Admitting: Nurse Practitioner

## 2021-10-09 VITALS — BP 122/60 | HR 68 | Temp 98.1°F | Ht 63.6 in | Wt 142.2 lb

## 2021-10-09 DIAGNOSIS — G8929 Other chronic pain: Secondary | ICD-10-CM | POA: Diagnosis not present

## 2021-10-09 DIAGNOSIS — M25511 Pain in right shoulder: Secondary | ICD-10-CM | POA: Diagnosis not present

## 2021-10-09 DIAGNOSIS — I10 Essential (primary) hypertension: Secondary | ICD-10-CM | POA: Diagnosis not present

## 2021-10-09 DIAGNOSIS — R7303 Prediabetes: Secondary | ICD-10-CM | POA: Diagnosis not present

## 2021-10-09 DIAGNOSIS — E782 Mixed hyperlipidemia: Secondary | ICD-10-CM | POA: Diagnosis not present

## 2021-10-09 MED ORDER — DICLOFENAC SODIUM 1 % EX GEL: 2.0000 g | Freq: Four times a day (QID) | CUTANEOUS | 2 refills | Status: DC

## 2021-10-09 NOTE — Progress Notes (Signed)
I,[name],acting as a Education administrator for [employer], FNP.,have documented all relevant documentation on the behalf of [name], FNP,as directed by  [name], FNP while in the presence of [name], [place].  This visit occurred during the SARS-CoV-2 public health emergency.  Safety protocols were in place, including screening questions prior to the visit, additional usage of staff PPE, and extensive cleaning of exam room while observing appropriate contact time as indicated for disinfecting solutions.  Subjective:     Patient ID: Adrienne Lopez , female    DOB: November 15, 1950 , 71 y.o.   MRN: [id number]   Chief Complaint  Patient presents with   Hypertension    HPI  Here for BP follow up and she is having right shoulder pain. Has not affected daily lifestyle. She took protonix for 8 weeks without relief and she is to have an endoscopy done.  Hypertension This is a chronic problem. The current episode started more than 1 year ago. The problem is controlled. Pertinent negatives include no anxiety. There are no compliance problems.  There is no history of angina.  Shoulder Pain  The pain is present in the right shoulder. This is a new problem. The current episode started more than 1 month ago (3 months ago). There has been no history of extremity trauma. The problem occurs intermittently. The problem has been gradually worsening. The quality of the pain is described as aching (feels like pain to her bone). Pertinent negatives include no fever. The symptoms are aggravated by activity. She has tried nothing for the symptoms. Family history does not include gout. There is no history of diabetes.    Past Medical History:  Diagnosis Date   Cancer [facility]) 1999   breast, left   Hyperparathyroidism, primary ([place])    Osteoporosis    TIA (transient ischemic attack) 12/26/2000   NO RESIDUAL PROBLEM     Family History  Problem Relation Age of Onset   Hypertension Mother    Prostate cancer Father     Heart attack Maternal Grandmother    Cancer Maternal Grandfather        oral cancer     Current Outpatient Medications:    diclofenac Sodium (VOLTAREN) 1 % GEL, Apply 2 g topically 4 (four) times daily., Disp: 100 g, Rfl: 2   Ascorbic Acid (VITAMIN C) 1000 MG tablet, Take 1,000 mg by mouth daily., Disp: , Rfl:    aspirin 81 MG tablet, Take 81 mg by mouth daily., Disp: , Rfl:    atenolol-chlorthalidone (TENORETIC) 100-25 MG tablet, TAKE 1/2 (ONE-HALF) TABLET BY MOUTH  DAILY, Disp: 45 tablet, Rfl: 3   atorvastatin (LIPITOR) 40 MG tablet, Take 1 tablet (40 mg total) by mouth daily., Disp: 90 tablet, Rfl: 3   B Complex-C (B-COMPLEX WITH VITAMIN C) tablet, Take 1 tablet by mouth daily. 137m, Disp: , Rfl:    BIOTIN 5000 PO, Take 1 tablet by mouth daily. , Disp: , Rfl:    cholecalciferol (VITAMIN D) 1000 UNITS tablet, Take 5,000 Units by mouth daily., Disp: , Rfl:    Cyanocobalamin (VITAMIN B-12 PO), Take 2,500 mg by mouth daily., Disp: , Rfl:    Multiple Vitamin (MULTIVITAMIN) capsule, Take 1 capsule by mouth daily., Disp: , Rfl:    Probiotic Product (PROBIOTIC DAILY PO), Take 1 tablet by mouth daily., Disp: , Rfl:    No Known Allergies   Review of Systems  Constitutional: Negative.  Negative for fever.  Respiratory: Negative.    Cardiovascular: Negative.  Gastrointestinal: Negative.   Musculoskeletal:  Positive for arthralgias.  Neurological: Negative.     Today's Vitals   10/09/21 0917  BP: 122/60  Pulse: 68  Temp: 98.1 F (36.7 C)  TempSrc: Oral  Weight: 142 lb 3.2 oz (64.5 kg)  Height: 5' 3.6" (1.615 m)   Body mass index is 24.72 kg/m.  Wt Readings from Last 3 Encounters:  10/09/21 142 lb 3.2 oz (64.5 kg)  06/04/21 138 lb 12.8 oz (63 kg)  05/16/21 141 lb 12.8 oz (64.3 kg)    Objective:  Physical Exam Vitals reviewed.  Constitutional:      General: She is not in acute distress.    Appearance: Normal appearance.  Cardiovascular:     Pulses: Normal pulses.      Heart sounds: Normal heart sounds. No murmur heard. Pulmonary:     Effort: Pulmonary effort is normal. No respiratory distress.  Musculoskeletal:        General: No swelling, tenderness, deformity or signs of injury.     Right shoulder: No swelling, deformity or tenderness. Decreased range of motion. Normal strength. Normal pulse.     Left shoulder: No swelling, deformity or tenderness. Normal range of motion. Normal strength. Normal pulse.  Neurological:     General: No focal deficit present.     Mental Status: She is alert and oriented to person, place, and time.     Cranial Nerves: No cranial nerve deficit.     Motor: No weakness.  Psychiatric:        Mood and Affect: Mood normal.        Behavior: Behavior normal.        Thought Content: Thought content normal.        Judgment: Judgment normal.        Assessment And Plan:     1. Essential hypertension Comments: Blood pressure is normal. Continue current medications - BMP8+EGFR - Lipid panel  2. Prediabetes Comments: Slightly elevated at last visit, continue to limit intake of sugary foods and drinks.  - Hemoglobin A1c - BMP8+EGFR  3. Mixed hyperlipidemia Comments: Stable, tolerating statin well - Lipid panel  4. Chronic right shoulder pain Comments: Slight limited range of motion with internal rotation. Negative neers/hawkins. Arthritis vs bursitis vs tendintis. Will try pain cream if ineffective call back to office - diclofenac Sodium (VOLTAREN) 1 % GEL; Apply 2 g topically 4 (four) times daily.  Dispense: 100 g; Refill: 2     Patient was given opportunity to ask questions. Patient verbalized understanding of the plan and was able to repeat key elements of the plan. All questions were answered to their satisfaction.  [name], FNP   I, [name], FNP, have reviewed all documentation for this visit. The documentation on 10/09/21 for the exam, diagnosis, procedures, and orders are all accurate and complete.    IF YOU HAVE BEEN REFERRED TO A SPECIALIST, IT MAY TAKE 1-2 WEEKS TO SCHEDULE/PROCESS THE REFERRAL. IF YOU HAVE NOT HEARD FROM US/SPECIALIST IN TWO WEEKS, PLEASE GIVE [place] A CALL AT [phone].   THE PATIENT IS ENCOURAGED TO PRACTICE SOCIAL DISTANCING DUE TO THE COVID-19 PANDEMIC.

## 2021-10-09 NOTE — Patient Instructions (Addendum)
Hypertension, Adult High blood pressure (hypertension) is when the force of blood pumping through the arteries is too strong. The arteries are the blood vessels that carry blood from the heart throughout the body. Hypertension forces the heart to work harder to pump blood and may cause arteries to become narrow or stiff. Untreated or uncontrolled hypertension can cause a heart attack, heart failure, a stroke, kidney disease, and other problems. A blood pressure reading consists of a higher number over a lower number. Ideally, your blood pressure should be below 120/80. The first ("top") number is called the systolic pressure. It is a measure of the pressure in your arteries as your heart beats. The second ("bottom") number is called the diastolic pressure. It is a measure of the pressure in your arteries as the heart relaxes. What are the causes? The exact cause of this condition is not known. There are some conditions that result in or are related to high blood pressure. What increases the risk? Some risk factors for high blood pressure are under your control. The following factors may make you more likely to develop this condition: Smoking. Having type 2 diabetes mellitus, high cholesterol, or both. Not getting enough exercise or physical activity. Being overweight. Having too much fat, sugar, calories, or salt (sodium) in your diet. Drinking too much alcohol. Some risk factors for high blood pressure may be difficult or impossible to change. Some of these factors include: Having chronic kidney disease. Having a family history of high blood pressure. Age. Risk increases with age. Race. You may be at higher risk if you are African American. Gender. Men are at higher risk than women before age 20. After age 105, women are at higher risk than men. Having obstructive sleep apnea. Stress. What are the signs or symptoms? High blood pressure may not cause symptoms. Very high blood pressure  (hypertensive crisis) may cause: Headache. Anxiety. Shortness of breath. Nosebleed. Nausea and vomiting. Vision changes. Severe chest pain. Seizures. How is this diagnosed? This condition is diagnosed by measuring your blood pressure while you are seated, with your arm resting on a flat surface, your legs uncrossed, and your feet flat on the floor. The cuff of the blood pressure monitor will be placed directly against the skin of your upper arm at the level of your heart. It should be measured at least twice using the same arm. Certain conditions can cause a difference in blood pressure between your right and left arms. Certain factors can cause blood pressure readings to be lower or higher than normal for a short period of time: When your blood pressure is higher when you are in a health care provider's office than when you are at home, this is called white coat hypertension. Most people with this condition do not need medicines. When your blood pressure is higher at home than when you are in a health care provider's office, this is called masked hypertension. Most people with this condition may need medicines to control blood pressure. If you have a high blood pressure reading during one visit or you have normal blood pressure with other risk factors, you may be asked to: Return on a different day to have your blood pressure checked again. Monitor your blood pressure at home for 1 week or longer. If you are diagnosed with hypertension, you may have other blood or imaging tests to help your health care provider understand your overall risk for other conditions. How is this treated? This condition is treated by making  healthy lifestyle changes, such as eating healthy foods, exercising more, and reducing your alcohol intake. Your health care provider may prescribe medicine if lifestyle changes are not enough to get your blood pressure under control, and if: Your systolic blood pressure is above  130. Your diastolic blood pressure is above 80. Your personal target blood pressure may vary depending on your medical conditions, your age, and other factors. Follow these instructions at home: Eating and drinking  Eat a diet that is high in fiber and potassium, and low in sodium, added sugar, and fat. An example eating plan is called the DASH (Dietary Approaches to Stop Hypertension) diet. To eat this way: Eat plenty of fresh fruits and vegetables. Try to fill one half of your plate at each meal with fruits and vegetables. Eat whole grains, such as whole-wheat pasta, brown rice, or whole-grain bread. Fill about one fourth of your plate with whole grains. Eat or drink low-fat dairy products, such as skim milk or low-fat yogurt. Avoid fatty cuts of meat, processed or cured meats, and poultry with skin. Fill about one fourth of your plate with lean proteins, such as fish, chicken without skin, beans, eggs, or tofu. Avoid pre-made and processed foods. These tend to be higher in sodium, added sugar, and fat. Reduce your daily sodium intake. Most people with hypertension should eat less than 1,500 mg of sodium a day. Do not drink alcohol if: Your health care provider tells you not to drink. You are pregnant, may be pregnant, or are planning to become pregnant. If you drink alcohol: Limit how much you use to: 0-1 drink a day for women. 0-2 drinks a day for men. Be aware of how much alcohol is in your drink. In the [place] one drink equals one 12 oz bottle of beer (355 mL), one 5 oz glass of wine (148 mL), or one 1 oz glass of hard liquor (44 mL). Lifestyle  Work with your health care provider to maintain a healthy body weight or to lose weight. Ask what an ideal weight is for you. Get at least 30 minutes of exercise most days of the week. Activities may include walking, swimming, or biking. Include exercise to strengthen your muscles (resistance exercise), such as Pilates or lifting weights, as  part of your weekly exercise routine. Try to do these types of exercises for 30 minutes at least 3 days a week. Do not use any products that contain nicotine or tobacco, such as cigarettes, e-cigarettes, and chewing tobacco. If you need help quitting, ask your health care provider. Monitor your blood pressure at home as told by your health care provider. Keep all follow-up visits as told by your health care provider. This is important. Medicines Take over-the-counter and prescription medicines only as told by your health care provider. Follow directions carefully. Blood pressure medicines must be taken as prescribed. Do not skip doses of blood pressure medicine. Doing this puts you at risk for problems and can make the medicine less effective. Ask your health care provider about side effects or reactions to medicines that you should watch for. Contact a health care provider if you: Think you are having a reaction to a medicine you are taking. Have headaches that keep coming back (recurring). Feel dizzy. Have swelling in your ankles. Have trouble with your vision. Get help right away if you: Develop a severe headache or confusion. Have unusual weakness or numbness. Feel faint. Have severe pain in your chest or abdomen. Vomit repeatedly. Have trouble  breathing. Summary Hypertension is when the force of blood pumping through your arteries is too strong. If this condition is not controlled, it may put you at risk for serious complications. Your personal target blood pressure may vary depending on your medical conditions, your age, and other factors. For most people, a normal blood pressure is less than 120/80. Hypertension is treated with lifestyle changes, medicines, or a combination of both. Lifestyle changes include losing weight, eating a healthy, low-sodium diet, exercising more, and limiting alcohol. This information is not intended to replace advice given to you by your health care  provider. Make sure you discuss any questions you have with your health care provider. Document Revised: 04/28/2018 Document Reviewed: 04/28/2018 Elsevier Patient Education  2022 [place].  Shoulder Pain Many things can cause shoulder pain, including: An injury. Moving the shoulder in the same way again and again (overuse). Joint pain (arthritis). Pain can come from: Swelling and irritation (inflammation) of any part of the shoulder. An injury to the shoulder joint. An injury to: Tissues that connect muscle to bone (tendons). Tissues that connect bones to each other (ligaments). Bones. Follow these instructions at home: Watch for changes in your symptoms. Let your doctor know about them. Follow these instructions to help with your pain. If you have a sling: Wear the sling as told by your doctor. Remove it only as told by your doctor. Loosen the sling if your fingers: Tingle. Become numb. Turn cold and blue. Keep the sling clean. If the sling is not waterproof: Do not let it get wet. Take the sling off when you shower or bathe. Managing pain, stiffness, and swelling  If told, put ice on the painful area: Put ice in a plastic bag. Place a towel between your skin and the bag. Leave the ice on for 20 minutes, 2-3 times a day. Stop putting ice on if it does not help with the pain. Squeeze a soft ball or a foam pad as much as possible. This prevents swelling in the shoulder. It also helps to strengthen the arm. General instructions Take over-the-counter and prescription medicines only as told by your doctor. Keep all follow-up visits as told by your doctor. This is important. Contact a doctor if: Your pain gets worse. Medicine does not help your pain. You have new pain in your arm, hand, or fingers. Get help right away if: Your arm, hand, or fingers: Tingle. Are numb. Are swollen. Are painful. Turn white or blue. Summary Shoulder pain can be caused by many things.  These include injury, moving the shoulder in the same away again and again, and joint pain. Watch for changes in your symptoms. Let your doctor know about them. This condition may be treated with a sling, ice, and pain medicine. Contact your doctor if the pain gets worse or you have new pain. Get help right away if your arm, hand, or fingers tingle or get numb, swollen, or painful. Keep all follow-up visits as told by your doctor. This is important. This information is not intended to replace advice given to you by your health care provider. Make sure you discuss any questions you have with your health care provider. Document Revised: 05/03/2021 Document Reviewed: 05/03/2021 Elsevier Patient Education  2022 [place] can get Voltaren Gel over the counter if you can not get the diclofenac gel.

## 2021-10-10 LAB — BMP8+EGFR
BUN/Creatinine Ratio: 30 — ABNORMAL HIGH (ref 12–28)
BUN: 30 mg/dL — ABNORMAL HIGH (ref 8–27)
CO2: 27 mmol/L (ref 20–29)
Calcium: 10.1 mg/dL (ref 8.7–10.3)
Chloride: 99 mmol/L (ref 96–106)
Creatinine, Ser: 1 mg/dL (ref 0.57–1.00)
Glucose: 81 mg/dL (ref 70–99)
Potassium: 3.9 mmol/L (ref 3.5–5.2)
Sodium: 141 mmol/L (ref 134–144)
eGFR: 60 mL/min/{1.73_m2} (ref >59)

## 2021-10-10 LAB — HEMOGLOBIN A1C
Est. average glucose Bld gHb Est-mCnc: 123 mg/dL
Hgb A1c MFr Bld: 5.9 % — ABNORMAL HIGH (ref 4.8–5.6)

## 2021-10-10 LAB — LIPID PANEL
Chol/HDL Ratio: 2.1 ratio (ref 0.0–4.4)
Cholesterol, Total: 148 mg/dL (ref 100–199)
HDL: 69 mg/dL (ref >39)
LDL Chol Calc (NIH): 65 mg/dL (ref 0–99)
Triglycerides: 72 mg/dL (ref 0–149)
VLDL Cholesterol Cal: 14 mg/dL (ref 5–40)

## 2021-11-06 ENCOUNTER — Other Ambulatory Visit: Payer: Self-pay | Admitting: Internal Medicine

## 2021-11-06 ENCOUNTER — Encounter: Payer: Self-pay | Admitting: Nurse Practitioner

## 2021-11-06 DIAGNOSIS — E782 Mixed hyperlipidemia: Secondary | ICD-10-CM

## 2021-11-06 NOTE — Telephone Encounter (Signed)
Patient needs to make a 6 months F/U with Dr Debara Pickett. ?

## 2021-11-29 DIAGNOSIS — K449 Diaphragmatic hernia without obstruction or gangrene: Secondary | ICD-10-CM | POA: Diagnosis not present

## 2021-11-29 DIAGNOSIS — K2950 Unspecified chronic gastritis without bleeding: Secondary | ICD-10-CM | POA: Diagnosis not present

## 2021-11-29 DIAGNOSIS — K2970 Gastritis, unspecified, without bleeding: Secondary | ICD-10-CM | POA: Diagnosis not present

## 2021-11-29 DIAGNOSIS — K2980 Duodenitis without bleeding: Secondary | ICD-10-CM | POA: Diagnosis not present

## 2021-11-29 DIAGNOSIS — K219 Gastro-esophageal reflux disease without esophagitis: Secondary | ICD-10-CM | POA: Diagnosis not present

## 2021-11-29 DIAGNOSIS — R1319 Other dysphagia: Secondary | ICD-10-CM | POA: Diagnosis not present

## 2022-01-08 ENCOUNTER — Ambulatory Visit (INDEPENDENT_AMBULATORY_CARE_PROVIDER_SITE_OTHER): Payer: Medicare Other | Admitting: Nurse Practitioner

## 2022-01-08 ENCOUNTER — Encounter: Payer: Self-pay | Admitting: Nurse Practitioner

## 2022-01-08 VITALS — BP 128/68 | HR 57 | Temp 98.1°F | Ht 63.6 in | Wt 137.0 lb

## 2022-01-08 DIAGNOSIS — G8929 Other chronic pain: Secondary | ICD-10-CM | POA: Diagnosis not present

## 2022-01-08 DIAGNOSIS — M25511 Pain in right shoulder: Secondary | ICD-10-CM

## 2022-01-08 DIAGNOSIS — I10 Essential (primary) hypertension: Secondary | ICD-10-CM | POA: Diagnosis not present

## 2022-01-08 DIAGNOSIS — R7303 Prediabetes: Secondary | ICD-10-CM | POA: Diagnosis not present

## 2022-01-08 DIAGNOSIS — E782 Mixed hyperlipidemia: Secondary | ICD-10-CM | POA: Diagnosis not present

## 2022-01-08 LAB — BMP8+EGFR
BUN/Creatinine Ratio: 31 — ABNORMAL HIGH (ref 12–28)
BUN: 35 mg/dL — ABNORMAL HIGH (ref 8–27)
CO2: 24 mmol/L (ref 20–29)
Calcium: 10.1 mg/dL (ref 8.7–10.3)
Chloride: 102 mmol/L (ref 96–106)
Creatinine, Ser: 1.12 mg/dL — ABNORMAL HIGH (ref 0.57–1.00)
Glucose: 85 mg/dL (ref 70–99)
Potassium: 3.9 mmol/L (ref 3.5–5.2)
Sodium: 142 mmol/L (ref 134–144)
eGFR: 52 mL/min/{1.73_m2} — ABNORMAL LOW (ref >59)

## 2022-01-08 LAB — HEMOGLOBIN A1C
Est. average glucose Bld gHb Est-mCnc: 120 mg/dL
Hgb A1c MFr Bld: 5.8 % — ABNORMAL HIGH (ref 4.8–5.6)

## 2022-01-08 NOTE — Progress Notes (Signed)
?Industrial/product designer as a Education administrator for [employer], FNP.,have documented all relevant documentation on the behalf of [name], FNP,as directed by  [name], FNP while in the presence of [name], [place]. ? ?This visit occurred during the SARS-CoV-2 public health emergency.  Safety protocols were in place, including screening questions prior to the visit, additional usage of staff PPE, and extensive cleaning of exam room while observing appropriate contact time as indicated for disinfecting solutions. ? ?Subjective:  ?  ? Patient ID: Adrienne Lopez , female    DOB: [birth year] , 71 y.o.   MRN: [id number] ? ? ?Chief Complaint  ?Patient presents with  ? Hypertension  ? ? ?HPI ? ?Here for BP follow up. She is having continued shoulder pain. She did not take the diclofenac gel due to concerns of side effects but still having pain to her shoulder to her upper arm sometimes worse at night.  ? ?Hypertension ?This is a chronic problem. The current episode started more than 1 year ago. The problem is controlled. Pertinent negatives include no anxiety. There are no associated agents to hypertension. Risk factors for coronary artery disease include sedentary lifestyle. Past treatments include beta blockers. There are no compliance problems.  There is no history of angina. There is no history of chronic renal disease.  ?Shoulder Pain  ?The pain is present in the right shoulder. This is a chronic problem. The current episode started more than 1 month ago (3 months ago). There has been no history of extremity trauma. The problem occurs intermittently. The problem has been gradually worsening. The quality of the pain is described as aching. The pain is at a severity of 8/10 (at its worse.). Pertinent negatives include no fever. The symptoms are aggravated by activity. She has tried nothing for the symptoms. Family history does not include gout. There is no history of diabetes.   ? ?Past Medical History:  ?Diagnosis Date  ?  Cancer [facility]) 1999  ? breast, left  ? Hyperparathyroidism, primary ([place])   ? Osteoporosis   ? TIA (transient ischemic attack) 12/26/2000  ? NO RESIDUAL PROBLEM  ?  ? ?Family History  ?Problem Relation Age of Onset  ? Hypertension Mother   ? Prostate cancer Father   ? Heart attack Maternal Grandmother   ? Cancer Maternal Grandfather   ?     oral cancer  ? ? ? ?Current Outpatient Medications:  ?  Ascorbic Acid (VITAMIN C) 1000 MG tablet, Take 1,000 mg by mouth daily., Disp: , Rfl:  ?  aspirin 81 MG tablet, Take 81 mg by mouth daily., Disp: , Rfl:  ?  atenolol-chlorthalidone (TENORETIC) 100-25 MG tablet, TAKE 1/2 (ONE-HALF) TABLET BY MOUTH  DAILY, Disp: 45 tablet, Rfl: 3 ?  atorvastatin (LIPITOR) 40 MG tablet, Take 1 tablet by mouth once daily, Disp: 90 tablet, Rfl: 1 ?  B Complex-C (B-COMPLEX WITH VITAMIN C) tablet, Take 1 tablet by mouth daily. $RemoveBefo'100mg'[id number]$ , Disp: , Rfl:  ?  BIOTIN 5000 PO, Take 1 tablet by mouth daily. , Disp: , Rfl:  ?  cholecalciferol (VITAMIN D) 1000 UNITS tablet, Take 5,000 Units by mouth daily., Disp: , Rfl:  ?  Cyanocobalamin (VITAMIN B-12 PO), Take 2,500 mg by mouth daily., Disp: , Rfl:  ?  Multiple Vitamin (MULTIVITAMIN) capsule, Take 1 capsule by mouth daily., Disp: , Rfl:   ? ?No Known Allergies  ? ?Review of Systems  ?Constitutional: Negative.  Negative for fever.  ?Respiratory: Negative.    ?Cardiovascular: Negative.   ?  Gastrointestinal: Negative.   ?Neurological: Negative.    ? ?Today's Vitals  ? 01/08/22 1037  ?BP: 128/68  ?Pulse: (!) 57  ?Temp: 98.1 ?F (36.7 ?C)  ?TempSrc: Oral  ?Weight: 137 lb (62.1 kg)  ?Height: 5' 3.6" (1.615 m)  ? ?Body mass index is 23.81 kg/m?.  ?Wt Readings from Last 3 Encounters:  ?01/08/22 137 lb (62.1 kg)  ?10/09/21 142 lb 3.2 oz (64.5 kg)  ?06/04/21 138 lb 12.8 oz (63 kg)  ? ? ?Objective:  ?Physical Exam ?Vitals reviewed.  ?Constitutional:   ?   General: She is not in acute distress. ?   Appearance: Normal appearance.  ?Cardiovascular:  ?   Pulses: Normal pulses.  ?    Heart sounds: Normal heart sounds. No murmur heard. ?Pulmonary:  ?   Effort: Pulmonary effort is normal. No respiratory distress.  ?Musculoskeletal:     ?   General: No swelling, tenderness, deformity or signs of injury.  ?   Right shoulder: No swelling, deformity or tenderness. Normal range of motion. Normal strength. Normal pulse.  ?   Left shoulder: No swelling, deformity or tenderness. Normal range of motion. Normal strength. Normal pulse.  ?Neurological:  ?   General: No focal deficit present.  ?   Mental Status: She is alert and oriented to person, place, and time.  ?   Cranial Nerves: No cranial nerve deficit.  ?   Motor: No weakness.  ?Psychiatric:     ?   Mood and Affect: Mood normal.     ?   Behavior: Behavior normal.     ?   Thought Content: Thought content normal.     ?   Judgment: Judgment normal.  ?  ? ?   ?Assessment And Plan:  ?   ?1. Essential hypertension ?Comments: Blood pressure is well controlled. Continue current medications ?- BMP8+eGFR ? ?2. Prediabetes ?Comments: Diet controlled. Continue with healthy diet and regular exercise.  ?- Hemoglobin A1c ? ?3. Mixed hyperlipidemia ?Comments: Cholesterol levels are within normal limits. continue low fat diet ? ?4. Chronic right shoulder pain ?Comments: Continues to have pain to shoulder, I will refer to Orthopedics ?- Ambulatory referral to Orthopedic Surgery ?  ? ? ?Patient was given opportunity to ask questions. Patient verbalized understanding of the plan and was able to repeat key elements of the plan. All questions were answered to their satisfaction.  ?[name], FNP  ? ?I, [name], FNP, have reviewed all documentation for this visit. The documentation on 01/08/22 for the exam, diagnosis, procedures, and orders are all accurate and complete.  ? ?IF YOU HAVE BEEN REFERRED TO A SPECIALIST, IT MAY TAKE 1-2 WEEKS TO SCHEDULE/PROCESS THE REFERRAL. IF YOU HAVE NOT HEARD FROM US/SPECIALIST IN TWO WEEKS, PLEASE GIVE [place] A CALL AT [phone]  X [phone].  ? ?THE PATIENT IS ENCOURAGED TO PRACTICE SOCIAL DISTANCING DUE TO THE COVID-19 PANDEMIC.   ?

## 2022-01-08 NOTE — Patient Instructions (Signed)
Hypertension, Adult High blood pressure (hypertension) is when the force of blood pumping through the arteries is too strong. The arteries are the blood vessels that carry blood from the heart throughout the body. Hypertension forces the heart to work harder to pump blood and may cause arteries to become narrow or stiff. Untreated or uncontrolled hypertension can lead to a heart attack, heart failure, a stroke, kidney disease, and other problems. A blood pressure reading consists of a higher number over a lower number. Ideally, your blood pressure should be below 120/80. The first ("top") number is called the systolic pressure. It is a measure of the pressure in your arteries as your heart beats. The second ("bottom") number is called the diastolic pressure. It is a measure of the pressure in your arteries as the heart relaxes. What are the causes? The exact cause of this condition is not known. There are some conditions that result in high blood pressure. What increases the risk? Certain factors may make you more likely to develop high blood pressure. Some of these risk factors are under your control, including: Smoking. Not getting enough exercise or physical activity. Being overweight. Having too much fat, sugar, calories, or salt (sodium) in your diet. Drinking too much alcohol. Other risk factors include: Having a personal history of heart disease, diabetes, high cholesterol, or kidney disease. Stress. Having a family history of high blood pressure and high cholesterol. Having obstructive sleep apnea. Age. The risk increases with age. What are the signs or symptoms? High blood pressure may not cause symptoms. Very high blood pressure (hypertensive crisis) may cause: Headache. Fast or irregular heartbeats (palpitations). Shortness of breath. Nosebleed. Nausea and vomiting. Vision changes. Severe chest pain, dizziness, and seizures. How is this diagnosed? This condition is diagnosed by  measuring your blood pressure while you are seated, with your arm resting on a flat surface, your legs uncrossed, and your feet flat on the floor. The cuff of the blood pressure monitor will be placed directly against the skin of your upper arm at the level of your heart. Blood pressure should be measured at least twice using the same arm. Certain conditions can cause a difference in blood pressure between your right and left arms. If you have a high blood pressure reading during one visit or you have normal blood pressure with other risk factors, you may be asked to: Return on a different day to have your blood pressure checked again. Monitor your blood pressure at home for 1 week or longer. If you are diagnosed with hypertension, you may have other blood or imaging tests to help your health care provider understand your overall risk for other conditions. How is this treated? This condition is treated by making healthy lifestyle changes, such as eating healthy foods, exercising more, and reducing your alcohol intake. You may be referred for counseling on a healthy diet and physical activity. Your health care provider may prescribe medicine if lifestyle changes are not enough to get your blood pressure under control and if: Your systolic blood pressure is above 130. Your diastolic blood pressure is above 80. Your personal target blood pressure may vary depending on your medical conditions, your age, and other factors. Follow these instructions at home: Eating and drinking  Eat a diet that is high in fiber and potassium, and low in sodium, added sugar, and fat. An example of this eating plan is called the DASH diet. DASH stands for Dietary Approaches to Stop Hypertension. To eat this way: Eat   plenty of fresh fruits and vegetables. Try to fill one half of your plate at each meal with fruits and vegetables. Eat whole grains, such as whole-wheat pasta, brown rice, or whole-grain bread. Fill about one  fourth of your plate with whole grains. Eat or drink low-fat dairy products, such as skim milk or low-fat yogurt. Avoid fatty cuts of meat, processed or cured meats, and poultry with skin. Fill about one fourth of your plate with lean proteins, such as fish, chicken without skin, beans, eggs, or tofu. Avoid pre-made and processed foods. These tend to be higher in sodium, added sugar, and fat. Reduce your daily sodium intake. Many people with hypertension should eat less than 1,500 mg of sodium a day. Do not drink alcohol if: Your health care provider tells you not to drink. You are pregnant, may be pregnant, or are planning to become pregnant. If you drink alcohol: Limit how much you have to: 0-1 drink a day for women. 0-2 drinks a day for men. Know how much alcohol is in your drink. In the [place] one drink equals one 12 oz bottle of beer (355 mL), one 5 oz glass of wine (148 mL), or one 1 oz glass of hard liquor (44 mL). Lifestyle  Work with your health care provider to maintain a healthy body weight or to lose weight. Ask what an ideal weight is for you. Get at least 30 minutes of exercise that causes your heart to beat faster (aerobic exercise) most days of the week. Activities may include walking, swimming, or biking. Include exercise to strengthen your muscles (resistance exercise), such as Pilates or lifting weights, as part of your weekly exercise routine. Try to do these types of exercises for 30 minutes at least 3 days a week. Do not use any products that contain nicotine or tobacco. These products include cigarettes, chewing tobacco, and vaping devices, such as e-cigarettes. If you need help quitting, ask your health care provider. Monitor your blood pressure at home as told by your health care provider. Keep all follow-up visits. This is important. Medicines Take over-the-counter and prescription medicines only as told by your health care provider. Follow directions carefully. Blood  pressure medicines must be taken as prescribed. Do not skip doses of blood pressure medicine. Doing this puts you at risk for problems and can make the medicine less effective. Ask your health care provider about side effects or reactions to medicines that you should watch for. Contact a health care provider if you: Think you are having a reaction to a medicine you are taking. Have headaches that keep coming back (recurring). Feel dizzy. Have swelling in your ankles. Have trouble with your vision. Get help right away if you: Develop a severe headache or confusion. Have unusual weakness or numbness. Feel faint. Have severe pain in your chest or abdomen. Vomit repeatedly. Have trouble breathing. These symptoms may be an emergency. Get help right away. Call 911. Do not wait to see if the symptoms will go away. Do not drive yourself to the hospital. Summary Hypertension is when the force of blood pumping through your arteries is too strong. If this condition is not controlled, it may put you at risk for serious complications. Your personal target blood pressure may vary depending on your medical conditions, your age, and other factors. For most people, a normal blood pressure is less than 120/80. Hypertension is treated with lifestyle changes, medicines, or a combination of both. Lifestyle changes include losing weight, eating a healthy,   low-sodium diet, exercising more, and limiting alcohol. This information is not intended to replace advice given to you by your health care provider. Make sure you discuss any questions you have with your health care provider. Document Revised: 06/25/2021 Document Reviewed: 06/25/2021 Elsevier Patient Education  2023 Elsevier Inc.  

## 2022-01-29 ENCOUNTER — Ambulatory Visit: Payer: Medicare Other | Admitting: Orthopaedic Surgery

## 2022-01-29 ENCOUNTER — Ambulatory Visit (INDEPENDENT_AMBULATORY_CARE_PROVIDER_SITE_OTHER): Payer: Medicare Other

## 2022-01-29 DIAGNOSIS — G8929 Other chronic pain: Secondary | ICD-10-CM

## 2022-01-29 DIAGNOSIS — M25511 Pain in right shoulder: Secondary | ICD-10-CM

## 2022-01-29 NOTE — Progress Notes (Signed)
Office Visit Note   Patient: Adrienne Lopez           Date of Birth: 08-01-1951           MRN: [id number] Visit Date: 01/29/2022              Requested by: Adrienne Lopez, [address] [place],  [place] [id number] PCP: Adrienne Lopez   Assessment & Plan: Visit Diagnoses:  1. Chronic right shoulder pain     Plan: Dr. Ninfa Linden and myself offered the patient conservative treatment which would include therapy and an subacromial injection.  She defers the subacromial injection.  Therefore we will send her to just formal therapy to work on range of motion, strengthening, modalities and home exercise program.  Questions were encouraged and answered by Dr. Pasty Arch.  We will see her back in 6 weeks see how she is doing overall.  Follow-Up Instructions: Return in about 6 weeks (around 03/12/2022).   Orders:  Orders Placed This Encounter  Procedures   XR Shoulder Right   No orders of the defined types were placed in this encounter.     Procedures: No procedures performed   Clinical Data: No additional findings.   Subjective: Chief Complaint  Patient presents with   Right Shoulder - Pain    HPI Adrienne Lopez is a 71 year old female comes in today with right shoulder pain that is been ongoing for the past 6 months on and off.  Pain is 5 out of 10 at worst.  Had no treatment for this.  Was given Voltaren gel by her primary care physician which she did use after reading the possible side effects.  She has had no therapy.  She is having no real radicular symptoms down the arm.  Pain is worse at night.  Pain is worse with range of motion particularly internal rotation. Review of Systems See HPI otherwise negative or noncontributory.  Objective: Vital Signs: There were no vitals taken for this visit.  Physical Exam Constitutional:      Appearance: She is not ill-appearing or diaphoretic.  Pulmonary:     Effort: Pulmonary effort is normal.  Neurological:     Mental  Status: She is alert and oriented to person, place, and time.  Psychiatric:        Mood and Affect: Mood normal.    Ortho Exam Bilateral shoulders 5 5 strength with external and internal rotation against resistance.  Empty can test is negative bilaterally.  Impingement testing positive on the left negative on the right.  Liftoff test is negative bilaterally.  5 out of 5 strength bilateral biceps against resistance.  Right distal biceps tendon is intact and nontender.  She is nontender throughout the right biceps muscle belly.  Specialty Comments:  No specialty comments available.  Imaging: XR Shoulder Right  Result Date: 01/29/2022 Right shoulder 3 views: Shoulder is well located.  Glenohumeral joint is well-preserved.  Mild AC joint arthritic changes.  No acute fractures or bony abnormalities otherwise.  Normal bone density    PMFS History: Patient Active Problem List   Diagnosis Date Noted   Essential hypertension 04/24/2021   Gastroesophageal reflux disease without esophagitis 04/24/2021   History of TIA (transient ischemic attack) 05/23/2014   Breast cancer ([place]) 11/15/2013   PVC (premature ventricular contraction) 07/25/2013   PAC (premature atrial contraction) 07/25/2013   Chest pain 07/08/2013   Palpitations 06/30/2013   Dyspnea 06/30/2013   Hyperparathyroidism, primary ([place]) 04/22/2013   Past Medical  History:  Diagnosis Date   Cancer ([place]) 1999   breast, left   Hyperparathyroidism, primary ([place])    Osteoporosis    TIA (transient ischemic attack) 12/26/2000   NO RESIDUAL PROBLEM    Family History  Problem Relation Age of Onset   Hypertension Mother    Prostate cancer Father    Heart attack Maternal Grandmother    Cancer Maternal Grandfather        oral cancer    Past Surgical History:  Procedure Laterality Date   ABDOMINAL HYSTERECTOMY     BREAST LUMPECTOMY  1999   left   PARATHYROIDECTOMY N/A 05/06/2013   Procedure: PARATHYROIDECTOMY;  Surgeon: Earnstine Regal, MD;  Location: [facility];  Service: General;  Laterality: N/A;   PARTIAL HYSTERECTOMY  1991   Social History   Occupational History   Not on file  Tobacco Use   Smoking status: Never   Smokeless tobacco: Never  Vaping Use   Vaping Use: Never used  Substance and Sexual Activity   Alcohol use: No   Drug use: No   Sexual activity: Not Currently

## 2022-01-29 NOTE — Addendum Note (Signed)
Addended by: Robyne Peers on: 01/29/2022 04:07 PM   Modules accepted: Orders

## 2022-02-01 ENCOUNTER — Other Ambulatory Visit: Payer: Self-pay | Admitting: Internal Medicine

## 2022-02-12 ENCOUNTER — Other Ambulatory Visit: Payer: Self-pay

## 2022-02-12 ENCOUNTER — Ambulatory Visit: Payer: Medicare Other | Admitting: Physical Therapy

## 2022-02-12 ENCOUNTER — Encounter: Payer: Self-pay | Admitting: Physical Therapy

## 2022-02-12 DIAGNOSIS — R293 Abnormal posture: Secondary | ICD-10-CM

## 2022-02-12 DIAGNOSIS — G8929 Other chronic pain: Secondary | ICD-10-CM

## 2022-02-12 DIAGNOSIS — M25511 Pain in right shoulder: Secondary | ICD-10-CM | POA: Diagnosis not present

## 2022-02-12 NOTE — Therapy (Signed)
OUTPATIENT PHYSICAL THERAPY SHOULDER EVALUATION   Patient Name: Adrienne Lopez MRN: [id number] DOB:[birth year], 71 y.o., female Today's Date: 02/12/2022   PT End of Session - 02/12/22 1050     Visit Number 1    Number of Visits 1    PT Start Time 1015    PT Stop Time 5956    PT Time Calculation (min) 27 min    Activity Tolerance Patient tolerated treatment well    Behavior During Therapy [facility] for tasks assessed/performed             Past Medical History:  Diagnosis Date   Cancer ([place]) 1999   breast, left   Hyperparathyroidism, primary ([place])    Osteoporosis    TIA (transient ischemic attack) 12/26/2000   NO RESIDUAL PROBLEM   Past Surgical History:  Procedure Laterality Date   ABDOMINAL HYSTERECTOMY     BREAST LUMPECTOMY  1999   left   PARATHYROIDECTOMY N/A 05/06/2013   Procedure: PARATHYROIDECTOMY;  Surgeon: Earnstine Regal, MD;  Location: [facility];  Service: General;  Laterality: N/A;   PARTIAL HYSTERECTOMY  1991   Patient Active Problem List   Diagnosis Date Noted   Essential hypertension 04/24/2021   Gastroesophageal reflux disease without esophagitis 04/24/2021   History of TIA (transient ischemic attack) 05/23/2014   Breast cancer ([place]) 11/15/2013   PVC (premature ventricular contraction) 07/25/2013   PAC (premature atrial contraction) 07/25/2013   Chest pain 07/08/2013   Palpitations 06/30/2013   Dyspnea 06/30/2013   Hyperparathyroidism, primary ([place]) 04/22/2013    PCP: [name], FNP  REFERRING PROVIDER: Mcarthur Rossetti, MD   REFERRING DIAG: [phone] (ICD-10-CM) - Chronic right shoulder pain   THERAPY DIAG:  Chronic right shoulder pain - Plan: PT plan of care cert/re-cert  Abnormal posture - Plan: PT plan of care cert/re-cert  Rationale for Evaluation and Treatment Rehabilitation  ONSET DATE: Dec 2022  SUBJECTIVE:                                                                                                                                                                                       SUBJECTIVE STATEMENT: Pt is a 71 y/o female who presents to OPPT for Rt shoulder pain x ~ 6 months without known injury.  She was unable to move arm to perform ADLs. Her pain has gradually improved, and now she feels she has no pain.    PERTINENT HISTORY: HTN, hx TIA, hx breast cancer, PVC, PAC, osteoporosis  PAIN:  Are you having pain? No  PRECAUTIONS: None  WEIGHT BEARING RESTRICTIONS No  FALLS:  Has patient fallen in last 6 months? No  LIVING ENVIRONMENT: Lives with:  lives alone Lives in: House/apartment  OCCUPATION: Retired from Human resources officer  PLOF: Independent and Leisure: reading, shopping, spend time with friends; no regular exercise  PATIENT GOALS exercises to decrease risk of reinjury  OBJECTIVE:   DIAGNOSTIC FINDINGS:  X-rays: negative  PATIENT SURVEYS:  02/12/22: FOTO deferred - 1x visit  COGNITION:  Overall cognitive status: Within functional limits for tasks assessed     SENSATION: WFL  POSTURE: Rounded shoulders, forward head  UPPER EXTREMITY ROM:    02/12/22: Bil shoulders WNL  UPPER EXTREMITY MMT:  MMT Right eval Left eval  Shoulder flexion 4/5 4/5  Shoulder extension    Shoulder abduction 4/5 4/5  Shoulder adduction    Shoulder internal rotation 5/5 5/5  Shoulder external rotation 4/5 5/5  (Blank rows = not tested)  SHOULDER SPECIAL TESTS:  Impingement tests: Hawkins/Kennedy impingement test: negative  Rotator cuff assessment: Empty can test: negative and Full can test: negative  Biceps assessment: Speed's test: negative   TODAY'S TREATMENT:    02/12/22 See HEP - performed trial reps as needed for comprehension; also instructed in app based exercise classes pt can look into.   PATIENT EDUCATION: Education details: HEP Person educated: Patient Education method: Consulting civil engineer, Media planner, and Handouts Education comprehension: verbalized understanding and  returned demonstration   HOME EXERCISE PROGRAM: Access Code: [email or web address] URL: [email or web address] Date: 02/12/2022 Prepared by: Faustino Congress  Exercises - Standing Shoulder Flexion to 90 Degrees with Dumbbells  - 1 x daily - 7 x weekly - 3 sets - 10 reps - Shoulder Abduction with Dumbbells - Thumbs Up  - 1 x daily - 7 x weekly - 3 sets - 10 reps - Standing Shoulder Horizontal Abduction with Dumbbells - Thumbs Up  - 1 x daily - 7 x weekly - 3 sets - 10 reps - Standing Shoulder External Rotation with Dumbbell  - 1 x daily - 7 x weekly - 3 sets - 10 reps  ASSESSMENT:  CLINICAL IMPRESSION: Patient is a 71 y.o. female who was seen today for physical therapy evaluation and treatment for Rt shoulder pain. Her pain has largely resolved, and at this time only has some mild weakness.  Issued HEP to decrease risk of reinjury.  No additional PT needs indicated at this time.    OBJECTIVE IMPAIRMENTS decreased strength, postural dysfunction, and pain.   ACTIVITY LIMITATIONS  none at this time  PARTICIPATION LIMITATIONS:  none at this time  PERSONAL FACTORS 3+ comorbidities: HTN, hx TIA, hx breast cancer, PVC, PAC, osteoporosis  are also affecting patient's functional outcome.   REHAB POTENTIAL: Excellent  CLINICAL DECISION MAKING: Stable/uncomplicated  EVALUATION COMPLEXITY: Low   GOALS:  1x eval   PLAN: PT FREQUENCY: one time visit  PT DURATION: other: n/a  PLANNED INTERVENTIONS: Therapeutic exercises and Patient/Family education  PLAN FOR NEXT SESSION: 1x eval   Laureen Abrahams, PT, DPT 02/12/22 10:52 AM

## 2022-03-12 ENCOUNTER — Ambulatory Visit: Payer: Medicare Other | Admitting: Orthopaedic Surgery

## 2022-04-22 ENCOUNTER — Other Ambulatory Visit: Payer: Self-pay | Admitting: Internal Medicine

## 2022-04-22 DIAGNOSIS — E782 Mixed hyperlipidemia: Secondary | ICD-10-CM

## 2022-04-28 ENCOUNTER — Other Ambulatory Visit: Payer: Self-pay | Admitting: Internal Medicine

## 2022-05-01 ENCOUNTER — Encounter: Payer: Self-pay | Admitting: Nurse Practitioner

## 2022-05-01 ENCOUNTER — Ambulatory Visit (INDEPENDENT_AMBULATORY_CARE_PROVIDER_SITE_OTHER): Payer: Medicare Other | Admitting: Nurse Practitioner

## 2022-05-01 VITALS — BP 132/70 | HR 60 | Temp 98.2°F | Ht 63.4 in | Wt 137.0 lb

## 2022-05-01 DIAGNOSIS — R7303 Prediabetes: Secondary | ICD-10-CM

## 2022-05-01 DIAGNOSIS — E210 Primary hyperparathyroidism: Secondary | ICD-10-CM | POA: Diagnosis not present

## 2022-05-01 DIAGNOSIS — I10 Essential (primary) hypertension: Secondary | ICD-10-CM

## 2022-05-01 DIAGNOSIS — E782 Mixed hyperlipidemia: Secondary | ICD-10-CM | POA: Diagnosis not present

## 2022-05-01 DIAGNOSIS — Z853 Personal history of malignant neoplasm of breast: Secondary | ICD-10-CM

## 2022-05-01 DIAGNOSIS — Z0000 Encounter for general adult medical examination without abnormal findings: Secondary | ICD-10-CM | POA: Diagnosis not present

## 2022-05-01 DIAGNOSIS — Z79899 Other long term (current) drug therapy: Secondary | ICD-10-CM | POA: Diagnosis not present

## 2022-05-01 LAB — POCT URINALYSIS DIPSTICK
Bilirubin, UA: NEGATIVE
Blood, UA: NEGATIVE
Glucose, UA: NEGATIVE
Ketones, UA: NEGATIVE
Leukocytes, UA: NEGATIVE
Nitrite, UA: NEGATIVE
Protein, UA: NEGATIVE
Spec Grav, UA: 1.015 (ref 1.010–1.025)
Urobilinogen, UA: 0.2 E.U./dL
pH, UA: 7.5 (ref 5.0–8.0)

## 2022-05-01 NOTE — Progress Notes (Signed)
I,Adrienne Lopez,acting as a Education administrator for [employer], FNP.,have documented all relevant documentation on the behalf of Adrienne Brine, FNP,as directed by  Adrienne Brine, FNP while in the presence of Adrienne Lopez, [place].  Subjective:     Patient ID: Adrienne Lopez , female    DOB: [birth year] , 71 y.o.   MRN: [id number]   Chief Complaint  Patient presents with   Annual Exam    HPI  The patient is here today for a physical examination. Patient would prefer to get EKG at cardiology, she has to call for an appt.     Past Medical History:  Diagnosis Date   Cancer [facility]) 1999   breast, left   Hyperparathyroidism, primary ([place])    Osteoporosis    TIA (transient ischemic attack) 12/26/2000   NO RESIDUAL PROBLEM     Family History  Problem Relation Age of Onset   Hypertension Mother    Prostate cancer Father    Heart attack Maternal Grandmother    Cancer Maternal Grandfather        oral cancer     Current Outpatient Medications:    Ascorbic Acid (VITAMIN C) 1000 MG tablet, Take 1,000 mg by mouth daily., Disp: , Rfl:    aspirin 81 MG tablet, Take 81 mg by mouth daily., Disp: , Rfl:    atenolol-chlorthalidone (TENORETIC) 100-25 MG tablet, Take 1/2 (one-half) tablet by mouth once daily, Disp: 45 tablet, Rfl: 0   atorvastatin (LIPITOR) 40 MG tablet, Take 1 tablet by mouth once daily, Disp: 90 tablet, Rfl: 0   B Complex-C (B-COMPLEX WITH VITAMIN C) tablet, Take 1 tablet by mouth daily. 166m, Disp: , Rfl:    BIOTIN 5000 PO, Take 1 tablet by mouth daily. , Disp: , Rfl:    cholecalciferol (VITAMIN D) 1000 UNITS tablet, Take 5,000 Units by mouth daily., Disp: , Rfl:    Cyanocobalamin (VITAMIN B-12 PO), Take 2,500 mg by mouth daily., Disp: , Rfl:    Multiple Vitamin (MULTIVITAMIN) capsule, Take 1 capsule by mouth daily., Disp: , Rfl:    No Known Allergies    The patient states she is status post hysterectomy.  Negative for: breast discharge, breast lump(s), breast pain and breast self exam.  Associated symptoms include abnormal vaginal bleeding. Pertinent negatives include abnormal bleeding (hematology), anxiety, decreased libido, depression, difficulty falling sleep, dyspareunia, history of infertility, nocturia, sexual dysfunction, sleep disturbances, urinary incontinence, urinary urgency, vaginal discharge and vaginal itching. Diet regular. The patient states her exercise level is minimal - she will stretch in the mornings and she is doing arm exercises. She went to PT for her shoulder.   The patient's tobacco use is:  Social History   Tobacco Use  Smoking Status Never  Smokeless Tobacco Never   She has been exposed to passive smoke. The patient's alcohol use is:  Social History   Substance and Sexual Activity  Alcohol Use No    Review of Systems  Constitutional: Negative.   HENT: Negative.    Eyes: Negative.   Respiratory: Negative.    Cardiovascular: Negative.   Gastrointestinal: Negative.   Endocrine: Negative.   Genitourinary: Negative.   Musculoskeletal: Negative.   Skin: Negative.   Allergic/Immunologic: Negative.   Neurological: Negative.   Hematological: Negative.   Psychiatric/Behavioral: Negative.       Today's Vitals   05/01/22 1004  BP: 132/70  Pulse: 60  Temp: 98.2 F (36.8 C)  TempSrc: Oral  Weight: 137 lb (62.1 kg)  Height: 5' 3.4" (1.61 m)  Body mass index is 23.96 kg/m.  Wt Readings from Last 3 Encounters:  05/01/22 137 lb (62.1 kg)  01/08/22 137 lb (62.1 kg)  10/09/21 142 lb 3.2 oz (64.5 kg)    Objective:  Physical Exam Vitals reviewed.  Constitutional:      General: She is not in acute distress.    Appearance: Normal appearance. She is well-developed.  HENT:     Head: Normocephalic and atraumatic.     Right Ear: Hearing, tympanic membrane and external ear normal. There is no impacted cerumen.     Left Ear: Hearing, tympanic membrane, ear canal and external ear normal. There is no impacted cerumen.     Ears:      Comments: Right lower ear canal with erythema present    Nose:     Comments: Deferred - masked    Mouth/Throat:     Comments: Deferred - masked Eyes:     General: Lids are normal.     Extraocular Movements: Extraocular movements intact.     Conjunctiva/sclera: Conjunctivae normal.     Pupils: Pupils are equal, round, and reactive to light.     Funduscopic exam:    Right eye: No papilledema.        Left eye: No papilledema.  Neck:     Thyroid: No thyroid mass.     Vascular: No carotid bruit.  Cardiovascular:     Rate and Rhythm: Normal rate and regular rhythm.     Pulses: Normal pulses.     Heart sounds: Normal heart sounds. No murmur heard. Pulmonary:     Effort: Pulmonary effort is normal. No respiratory distress.     Breath sounds: Normal breath sounds. No wheezing.  Chest:     Chest wall: No mass.  Breasts:    Tanner Score is 5.     Right: Normal. No mass or tenderness.     Left: Normal. No mass or tenderness.  Abdominal:     General: Abdomen is flat. Bowel sounds are normal. There is no distension.     Palpations: Abdomen is soft.     Tenderness: There is no abdominal tenderness.  Genitourinary:    Comments: Hysterectomy Musculoskeletal:        General: No swelling. Normal range of motion.     Cervical back: Full passive range of motion without pain, normal range of motion and neck supple.     Right lower leg: No edema.     Left lower leg: No edema.  Lymphadenopathy:     Upper Body:     Right upper body: No supraclavicular, axillary or pectoral adenopathy.     Left upper body: No supraclavicular, axillary or pectoral adenopathy.  Skin:    General: Skin is warm and dry.     Capillary Refill: Capillary refill takes less than 2 seconds.  Neurological:     General: No focal deficit present.     Mental Status: She is alert and oriented to person, place, and time.     Cranial Nerves: No cranial nerve deficit.     Sensory: No sensory deficit.     Motor: No weakness.   Psychiatric:        Mood and Affect: Mood normal.        Behavior: Behavior normal.        Thought Content: Thought content normal.        Judgment: Judgment normal.         Assessment And Plan:     1.  Routine general medical examination at a health care facility Behavior modifications discussed and diet history reviewed.   Pt will continue to exercise regularly and modify diet with low GI, plant based foods and decrease intake of processed foods.  Recommend intake of daily multivitamin, Vitamin D, and calcium.  Recommend mammogram and colonoscopy for preventive screenings, as well as recommend immunizations that include influenza (declines), TDAP, and Shingles (declines).   2. Essential hypertension Comments: Blood pressure is well controlled, continue current medications. Declined EKG would like to get at Cardiology, plans to call for an appt - POCT Urinalysis Dipstick (81002) - Microalbumin / Creatinine Urine Ratio - CMP14+EGFR  3. Prediabetes Comments: HgbA1c was slightly better at 5.8 continue to focus on decreasing sugar and starch intake. Increase fiber intake and increase physical activity. - Hemoglobin A1c - CMP14+EGFR  4. Mixed hyperlipidemia Comments: Stable, continue statin, tolerating well.  - CMP14+EGFR  5. History of breast cancer Comments: Treatment with lumpectomy on left breast and radiation in 1999  6. Hyperparathyroidism, primary ([place]) Comments: Had parathyroidectomy in 2014  7. Other long term (current) drug therapy - CBC  Patient was given opportunity to ask questions. Patient verbalized understanding of the plan and was able to repeat key elements of the plan. All questions were answered to their satisfaction.   Adrienne Brine, FNP   I, Adrienne Brine, FNP, have reviewed all documentation for this visit. The documentation on 05/01/22 for the exam, diagnosis, procedures, and orders are all accurate and complete.   THE PATIENT IS ENCOURAGED TO  PRACTICE SOCIAL DISTANCING DUE TO THE COVID-19 PANDEMIC.

## 2022-05-01 NOTE — Patient Instructions (Signed)

## 2022-05-02 LAB — CMP14+EGFR
ALT: 68 IU/L — ABNORMAL HIGH (ref 0–32)
AST: 49 IU/L — ABNORMAL HIGH (ref 0–40)
Albumin/Globulin Ratio: 1.2 (ref 1.2–2.2)
Albumin: 4.5 g/dL (ref 3.8–4.8)
Alkaline Phosphatase: 101 IU/L (ref 44–121)
BUN/Creatinine Ratio: 22 (ref 12–28)
BUN: 22 mg/dL (ref 8–27)
Bilirubin Total: 0.4 mg/dL (ref 0.0–1.2)
CO2: 26 mmol/L (ref 20–29)
Calcium: 9.6 mg/dL (ref 8.7–10.3)
Chloride: 100 mmol/L (ref 96–106)
Creatinine, Ser: 0.98 mg/dL (ref 0.57–1.00)
Globulin, Total: 3.8 g/dL (ref 1.5–4.5)
Glucose: 84 mg/dL (ref 70–99)
Potassium: 3.6 mmol/L (ref 3.5–5.2)
Sodium: 139 mmol/L (ref 134–144)
Total Protein: 8.3 g/dL (ref 6.0–8.5)
eGFR: 61 mL/min/{1.73_m2} (ref >59)

## 2022-05-02 LAB — HEMOGLOBIN A1C
Est. average glucose Bld gHb Est-mCnc: 117 mg/dL
Hgb A1c MFr Bld: 5.7 % — ABNORMAL HIGH (ref 4.8–5.6)

## 2022-05-02 LAB — CBC
Hematocrit: 41.6 % (ref 34.0–46.6)
Hemoglobin: 14.2 g/dL (ref 11.1–15.9)
MCH: 30.7 pg (ref 26.6–33.0)
MCHC: 34.1 g/dL (ref 31.5–35.7)
MCV: 90 fL (ref 79–97)
Platelets: 156 10*3/uL (ref 150–450)
RBC: 4.63 x10E6/uL (ref 3.77–5.28)
RDW: 14.2 % (ref 11.7–15.4)
WBC: 5.1 10*3/uL (ref 3.4–10.8)

## 2022-05-02 LAB — MICROALBUMIN / CREATININE URINE RATIO
Creatinine, Urine: 29.6 mg/dL
Microalb/Creat Ratio: <10 mg/g creat (ref 0–29)
Microalbumin, Urine: <3 ug/mL

## 2022-05-06 ENCOUNTER — Encounter: Payer: Self-pay | Admitting: Nurse Practitioner

## 2022-06-09 ENCOUNTER — Ambulatory Visit: Payer: Medicare Other | Attending: Internal Medicine | Admitting: Internal Medicine

## 2022-06-09 ENCOUNTER — Encounter: Payer: Self-pay | Admitting: Internal Medicine

## 2022-06-09 VITALS — BP 140/82 | HR 52 | Ht 63.0 in | Wt 136.8 lb

## 2022-06-09 DIAGNOSIS — E785 Hyperlipidemia, unspecified: Secondary | ICD-10-CM | POA: Diagnosis not present

## 2022-06-09 DIAGNOSIS — I10 Essential (primary) hypertension: Secondary | ICD-10-CM

## 2022-06-09 DIAGNOSIS — G459 Transient cerebral ischemic attack, unspecified: Secondary | ICD-10-CM | POA: Diagnosis not present

## 2022-06-09 DIAGNOSIS — E782 Mixed hyperlipidemia: Secondary | ICD-10-CM

## 2022-06-09 NOTE — Progress Notes (Signed)
OFFICE NOTE  Chief Complaint:  Follow-up  Primary Care Physician: [name], FNP  HPI:  Adrienne Lopez is a pleasant 71 year old female from [place]. She was noted to be hypercalcemic and ultimately diagnosed with hyperparathyroidism. She underwent parathyroid surgery by Dr. Harlow Asa in September. She did well since then however subsequently developed palpitations. She describes the palpitations as a hard thumping in her chest and neck were occurring several times a day after surgery. The symptoms have improved somewhat and it seems to be coincidental with her taking aspirin.  She does have a history of TIA in the past, but the episode was unclear as she "blacked out" when she was taking medication for breast cancer, specifically tamoxifen. She was taken off of that at that time. She did have a history of breast cancer and underwent chest wall radiation in 1999 but has been free of recurrence. Otherwise she has no other medical history and is currently not on any medications other than aspirin. He recently had blood work through Dr. Saul Fordyce office which indicates her thyroid function is low normal but her parathyroid is within normal limits and her calcium is now 9.3. She reports her palpitations occur a couple times a day and feel like a strong beating of the heart that causes her some mild shortness of breath. She denies any chest pain or anginal symptoms. There is no significant family history of heart disease.   At her last visit I recommended an echocardiogram and 48 hour monitor. She did wear the monitor for 48 hours which reported actually a number of both ventricular and atrial ectopic beats. Infectious thousand and 26 bigeminal ventricular beats. There was no significant nonsustained VT but there was some sustained supraventricular tachycardia.  She underwent an echocardiogram which was essentially normal except for some mitral regurgitation, but no wall motion abnormalities and preserved  EF of 55-60%.  Subsequent to this, she had an episode of chest pain which awoke her up at night on November 7. She went to the emergency department with symptoms of chest tightness and left arm pain. The symptoms resolved fairly quickly and she ruled out for MI. She did report some palpitations associated with this event.  In addition to her parathyroid problems, there is a question about thyroid abnormality as well. She is currently undergoing testing for this. She says all of these symptoms have come on since her surgery for the parathyroid glands.  Adrienne Lopez returns today and reports feeling well. She continues to have infrequent palpitations, despite atenolol.  She denies any chest pain or worsening shortness of breath. She's had no further TIA events. She's been compliant with daily aspirin.  12/02/2016  Adrienne Lopez returns for follow-up. Over the past year she has done well without complaints. She denies any palpitations. She followed up with Dr. Chalmers Cater and was noted to have elevated blood sugars - she was told to lose 10 pounds and she was able to successfully. BP was mildly elevated today, but came down to 134/86 after a re-check.  01/05/2018  Adrienne Lopez was seen today in follow-up.  Again she continues to do well.  She denies chest pain or worsening shortness of breath.  She has no palpitations.  Labs from July 2018 showed total cholesterol 195, HDL 66, LDL 111 and triglycerides 92.  Serum creatinine of 1.07.  Blood pressures been stable.  EKG today shows sinus bradycardia at 53.  05/03/2019  Adrienne Lopez is seen today for follow-up of hypertension.  Recently I did a telemedicine visit with her and her blood pressure was elevated.  I asked her to take some home blood pressure readings and submit that to me.  This was done in late May and early June.  Her blood pressures averaged between 833 and 825 systolic in the morning and generally between 115-120 2 in the evening.  We had readjusted her  medications to 50 mg of atenolol once a day in the morning.  Overall though I am not convinced that we have had very good control of her morning blood pressures.  06/02/2019  Adrienne Lopez returns today for follow-up of hypertension.  After further adjusting her medication it seems that we have now improved her blood pressures adequately.  She is currently taking atenolol 50 mg in the morning and chlorthalidone 12.5 mg daily.  She says this medicine has been difficult to cut up and oftentimes pulverize is the pill.  We discussed the possibility of taking the higher dose atenolol 100 mg / 25 mg combination with chlorthalidone and breaking it in half since the tablet is scored.  11/19/2020  Adrienne Lopez is seen today in follow-up.  Unfortunately January was not a good month for her.  It sounds like she may have had a TIA.  She had initial symptoms of right arm numbness and tingling as well as heaviness.  She also had unilateral headache and fullness in her ear with hearing loss.  This did resolve after less than 24 hours.  She saw her PCP who ordered a CT scan of her head which apparently showed no acute findings.  She was off of her aspirin and that was restarted.  She is also now on low-dose atorvastatin.  Labs as of August 2021 showed total cholesterol 246, HDL 71, LDL 157 and triglycerides 105.  If this was TIA then her target is an LDL less than 70.  Her history also suggest that she had TIA in the distant past as well.  She felt some palpitations recently however they were short-lived.  EKG today shows a sinus bradycardia at 59.  01/10/2021  Adrienne Lopez returns today for follow-up.  She underwent a work-up for her TIA including an echo which showed some mild valvular insufficiency but normal LV function and negative for PFO.  Carotid Doppler showed minimal to near normal findings.  She did have a repeat lipid which showed further improvement from her labs a month ago.  Total cholesterol is now 156,  triglycerides 75, HDL 69 and LDL 73 down from an LDL of 90 and 157 approximately 8 months ago.  Overall she feels well.  PMHx:  Past Medical History:  Diagnosis Date   Cancer [facility]) 1999   breast, left   Hyperparathyroidism, primary ([place])    Osteoporosis    TIA (transient ischemic attack) 12/26/2000   NO RESIDUAL PROBLEM    Past Surgical History:  Procedure Laterality Date   ABDOMINAL HYSTERECTOMY     BREAST LUMPECTOMY  1999   left   PARATHYROIDECTOMY N/A 05/06/2013   Procedure: PARATHYROIDECTOMY;  Surgeon: Earnstine Regal, MD;  Location: [facility] ORS;  Service: General;  Laterality: N/A;   PARTIAL HYSTERECTOMY  1991    FAMHx:  Family History  Problem Relation Age of Onset   Hypertension Mother    Prostate cancer Father    Heart attack Maternal Grandmother    Cancer Maternal Grandfather        oral cancer    SOCHx:   reports that she  has never smoked. She has never used smokeless tobacco. She reports that she does not drink alcohol and does not use drugs.  ALLERGIES:  No Known Allergies  ROS: Pertinent items noted in HPI and remainder of comprehensive ROS otherwise negative.  HOME MEDS: Current Outpatient Medications  Medication Sig Dispense Refill   Ascorbic Acid (VITAMIN C) 1000 MG tablet Take 1,000 mg by mouth daily.     aspirin 81 MG tablet Take 81 mg by mouth daily.     atenolol-chlorthalidone (TENORETIC) 100-25 MG tablet Take 1/2 (one-half) tablet by mouth once daily 45 tablet 0   atorvastatin (LIPITOR) 40 MG tablet Take 1 tablet by mouth once daily 90 tablet 0   B Complex-C (B-COMPLEX WITH VITAMIN C) tablet Take 1 tablet by mouth daily. '100mg'$      BIOTIN 5000 PO Take 1 tablet by mouth daily.      cholecalciferol (VITAMIN D) 1000 UNITS tablet Take 5,000 Units by mouth daily.     Cyanocobalamin (VITAMIN B-12 PO) Take 2,500 mg by mouth daily.     famotidine (PEPCID) 20 MG tablet Take 20 mg by mouth as needed.     Multiple Vitamin (MULTIVITAMIN) capsule Take 1 capsule by  mouth daily.     No current facility-administered medications for this visit.    LABS/IMAGING: No results found for this or any previous visit (from the past 48 hour(s)). No results found.  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   Ms. Lopez unrevealing echo findings with a negative bubble study.  Her carotid Dopplers were not significantly abnormal.  Cholesterol is much better at this point.  She should continue her current therapy and diet.  She has a follow-up with her PCP for a wellness visit in August.  No changes today.  Plan follow-up with me in 6 months or sooner as necessary.  Pixie Casino, MD, [facility], [place] Director of the Advanced Lipid Disorders &  Cardiovascular Risk Reduction Clinic Diplomate of the American Board of Clinical Lipidology Attending Cardiologist  Direct Dial: [phone]  Fax: [phone]  Website:  [email or web address] [email or web address] 06/09/2022, 9:46 AM

## 2022-06-09 NOTE — Patient Instructions (Signed)
Medication Instructions:  Your Physician recommend you continue on your current medication as directed.    *If you need a refill on your cardiac medications before your next appointment, please call your pharmacy*   Lab Work: None ordered today   Testing/Procedures: None ordered today   Follow-Up: At [facility], you and your health needs are our priority.  As part of our continuing mission to provide you with exceptional heart care, we have created designated Provider Care Teams.  These Care Teams include your primary Cardiologist (physician) and Advanced Practice Providers (APPs -  Physician Assistants and Nurse Practitioners) who all work together to provide you with the care you need, when you need it.  We recommend signing up for the patient portal called "MyChart".  Sign up information is provided on this After Visit Summary.  MyChart is used to connect with patients for Virtual Visits (Telemedicine).  Patients are able to view lab/test results, encounter notes, upcoming appointments, etc.  Non-urgent messages can be sent to your provider as well.   To learn more about what you can do with MyChart, go to [email or web address].    Your next appointment:   1 year(s)  The format for your next appointment:   In Person  Provider:   Pixie Casino, MD

## 2022-06-11 ENCOUNTER — Ambulatory Visit: Payer: Medicare (Managed Care) | Admitting: Nurse Practitioner

## 2022-06-11 ENCOUNTER — Ambulatory Visit (INDEPENDENT_AMBULATORY_CARE_PROVIDER_SITE_OTHER): Payer: Medicare Other

## 2022-06-11 ENCOUNTER — Encounter: Payer: Self-pay | Admitting: Nurse Practitioner

## 2022-06-11 ENCOUNTER — Ambulatory Visit (INDEPENDENT_AMBULATORY_CARE_PROVIDER_SITE_OTHER): Payer: Medicare Other | Admitting: Nurse Practitioner

## 2022-06-11 VITALS — BP 136/70 | HR 56 | Temp 97.6°F | Ht 60.0 in | Wt 137.0 lb

## 2022-06-11 DIAGNOSIS — R748 Abnormal levels of other serum enzymes: Secondary | ICD-10-CM

## 2022-06-11 DIAGNOSIS — Z0000 Encounter for general adult medical examination without abnormal findings: Secondary | ICD-10-CM

## 2022-06-11 DIAGNOSIS — E782 Mixed hyperlipidemia: Secondary | ICD-10-CM | POA: Diagnosis not present

## 2022-06-11 DIAGNOSIS — I10 Essential (primary) hypertension: Secondary | ICD-10-CM

## 2022-06-11 MED ORDER — ATORVASTATIN CALCIUM 20 MG PO TABS: 20.0000 mg | ORAL_TABLET | Freq: Every day | ORAL | 1 refills | Status: DC

## 2022-06-11 NOTE — Progress Notes (Signed)
I,Adrienne Lopez,acting as a Education administrator for [employer], FNP.,have documented all relevant documentation on the behalf of Adrienne Brine, FNP,as directed by  Adrienne Brine, FNP while in the presence of Adrienne Lopez, Adrienne Lopez.  Subjective:     Patient ID: Adrienne Lopez , female    DOB: [birth year] , 71 y.o.   MRN: [id number]   Chief Complaint  Patient presents with   Hyperlipidemia    HPI  Patient presents today with concerns from her last lab results.  Patient reports Dr. Debara Pickett recommended an ultrasound.   Hyperlipidemia     Past Medical History:  Diagnosis Date   Cancer [facility]) 1999   breast, left   Hyperparathyroidism, primary ([place])    Osteoporosis    TIA (transient ischemic attack) 12/26/2000   NO RESIDUAL PROBLEM     Family History  Problem Relation Age of Onset   Hypertension Mother    Prostate cancer Father    Heart attack Maternal Grandmother    Cancer Maternal Grandfather        oral cancer     Current Outpatient Medications:    Ascorbic Acid (VITAMIN C) 1000 MG tablet, Take 1,000 mg by mouth daily., Disp: , Rfl:    aspirin 81 MG tablet, Take 81 mg by mouth daily., Disp: , Rfl:    atenolol-chlorthalidone (TENORETIC) 100-25 MG tablet, Take 1/2 (one-half) tablet by mouth once daily, Disp: 45 tablet, Rfl: 0   atorvastatin (LIPITOR) 20 MG tablet, Take 1 tablet (20 mg total) by mouth daily., Disp: 90 tablet, Rfl: 1   B Complex-C (B-COMPLEX WITH VITAMIN C) tablet, Take 1 tablet by mouth daily. 162m, Disp: , Rfl:    BIOTIN 5000 PO, Take 1 tablet by mouth daily. , Disp: , Rfl:    cholecalciferol (VITAMIN D) 1000 UNITS tablet, Take 5,000 Units by mouth daily., Disp: , Rfl:    Cyanocobalamin (VITAMIN B-12 PO), Take 2,500 mg by mouth daily., Disp: , Rfl:    famotidine (PEPCID) 20 MG tablet, Take 20 mg by mouth as needed., Disp: , Rfl:    Multiple Vitamin (MULTIVITAMIN) capsule, Take 1 capsule by mouth daily., Disp: , Rfl:    No Known Allergies   Review of Systems  Constitutional:  Negative.   Respiratory: Negative.    Cardiovascular: Negative.   Gastrointestinal: Negative.   Musculoskeletal: Negative.   Neurological: Negative.   Psychiatric/Behavioral: Negative.       Today's Vitals   06/11/22 1108 06/11/22 1109  BP: (!) 140/70 136/70  Pulse:  (!) 56  Temp:  97.6 F (36.4 C)  TempSrc:  Oral  Weight:  137 lb (62.1 kg)  Height:  5' (1.524 m)   Body mass index is 26.76 kg/m.   Objective:  Physical Exam Vitals reviewed.  Constitutional:      General: She is not in acute distress.    Appearance: Normal appearance.  Cardiovascular:     Rate and Rhythm: Normal rate and regular rhythm.     Pulses: Normal pulses.     Heart sounds: Normal heart sounds. No murmur heard. Pulmonary:     Effort: Pulmonary effort is normal. No respiratory distress.     Breath sounds: Normal breath sounds. No wheezing.  Neurological:     Mental Status: She is alert.         Assessment And Plan:     1. Mixed hyperlipidemia Comments: Will change her atorvastatin to 20 mg daily at the recommendation of Cardiology - CMP14+EGFR - atorvastatin (LIPITOR) 20 MG tablet; Take  1 tablet (20 mg total) by mouth daily.  Dispense: 90 tablet; Refill: 1 - Lipid panel  2. Elevated liver enzymes Comments: Will repeat her levels were slightly elevated and will check abdomen Ultrasound. Answered all questions - CMP14+EGFR - US Abdomen Limited RUQ (LIVER/GB); Future  3. Essential hypertension Comments: Blood pressure was slightly elevated. Repeat was improved.      Patient was given opportunity to ask questions. Patient verbalized understanding of the plan and was able to repeat key elements of the plan. All questions were answered to their satisfaction.  Adrienne Brine, FNP   I, Adrienne Brine, FNP, have reviewed all documentation for this visit. The documentation on 06/11/22 for the exam, diagnosis, procedures, and orders are all accurate and complete.   IF YOU HAVE BEEN REFERRED TO A  SPECIALIST, IT MAY TAKE 1-2 WEEKS TO SCHEDULE/PROCESS THE REFERRAL. IF YOU HAVE NOT HEARD FROM US/SPECIALIST IN TWO WEEKS, PLEASE GIVE [place] A CALL AT [phone].   THE PATIENT IS ENCOURAGED TO PRACTICE SOCIAL DISTANCING DUE TO THE COVID-19 PANDEMIC.

## 2022-06-11 NOTE — Patient Instructions (Signed)

## 2022-06-11 NOTE — Progress Notes (Signed)
Subjective:   Adrienne Lopez is a 71 y.o. female who presents for Medicare Annual (Subsequent) preventive examination.  Review of Systems     Cardiac Risk Factors include: advanced age (>38mn, >>2women);hypertension     Objective:    Today's Vitals   06/11/22 1042 06/11/22 1102  BP: (!) 140/70 136/70  Pulse: (!) 56   Temp: 97.6 F (36.4 C)   TempSrc: Oral   SpO2: 98%   Weight: 137 lb (62.1 kg)   Height: 5' (1.524 m)    Body mass index is 26.76 kg/m.     06/11/2022   10:54 AM 02/12/2022   10:15 AM 05/16/2021    8:46 AM 04/18/2020   10:25 AM 07/08/2013   11:54 PM 04/29/2013    9:08 AM  Advanced Directives  Does Patient Have a Medical Advance Directive? No No No No Patient would not like information;Patient does not have advance directive Patient does not have advance directive;Patient would not like information  Would patient like information on creating a medical advance directive? No - Patient declined No - Patient declined No - Patient declined Yes (MAU/Ambulatory/Procedural Areas - Information given)    Pre-existing out of facility DNR order (yellow form or pink MOST form)     No     Current Medications (verified) Outpatient Encounter Medications as of 06/11/2022  Medication Sig   Ascorbic Acid (VITAMIN C) 1000 MG tablet Take 1,000 mg by mouth daily.   aspirin 81 MG tablet Take 81 mg by mouth daily.   atenolol-chlorthalidone (TENORETIC) 100-25 MG tablet Take 1/2 (one-half) tablet by mouth once daily   atorvastatin (LIPITOR) 40 MG tablet Take 1 tablet by mouth once daily (Patient taking differently: Takes every other day)   B Complex-C (B-COMPLEX WITH VITAMIN C) tablet Take 1 tablet by mouth daily. '100mg'$    BIOTIN 5000 PO Take 1 tablet by mouth daily.    cholecalciferol (VITAMIN D) 1000 UNITS tablet Take 5,000 Units by mouth daily.   Cyanocobalamin (VITAMIN B-12 PO) Take 2,500 mg by mouth daily.   famotidine (PEPCID) 20 MG tablet Take 20 mg by mouth as needed.    Multiple Vitamin (MULTIVITAMIN) capsule Take 1 capsule by mouth daily.   No facility-administered encounter medications on file as of 06/11/2022.    Allergies (verified) Patient has no known allergies.   History: Past Medical History:  Diagnosis Date   [place]   breast, left   Hyperparathyroidism, primary ([facility]    Osteoporosis    TIA (transient ischemic attack) 12/26/2000   NO RESIDUAL PROBLEM   Past Surgical History:  Procedure Laterality Date   ABDOMINAL HYSTERECTOMY     BREAST LUMPECTOMY  1999   left   PARATHYROIDECTOMY N/A 05/06/2013   Procedure: PARATHYROIDECTOMY;  Surgeon: TEarnstine Regal MD;  Location: [facility];  Service: General;  Laterality: N/A;   PARTIAL HYSTERECTOMY  1991   Family History  Problem Relation Age of Onset   Hypertension Mother    Prostate cancer Father    Heart attack Maternal Grandmother    Cancer Maternal Grandfather        oral cancer   Social History   Socioeconomic History   Marital status: Divorced    Spouse name: Not on file   Number of children: Not on file   Years of education: Not on file   Highest education level: Not on file  Occupational History   Not on file  Tobacco Use   Smoking status: Never   Smokeless  tobacco: Never  Vaping Use   Vaping Use: Never used  Substance and Sexual Activity   Alcohol use: No   Drug use: No   Sexual activity: Not Currently  Other Topics Concern   Not on file  Social History Narrative   Not on file   Social Determinants of Health   Financial Resource Strain: Low Risk  (06/11/2022)   Overall Financial Resource Strain (CARDIA)    Difficulty of Paying Living Expenses: Not hard at all  Food Insecurity: No Food Insecurity (06/11/2022)   Hunger Vital Sign    Worried About Running Out of Food in the Last Year: Never true    Ran Out of Food in the Last Year: Never true  Transportation Needs: No Transportation Needs (06/11/2022)   PRAPARE - Hydrologist  (Medical): No    Lack of Transportation (Non-Medical): No  Physical Activity: Inactive (06/11/2022)   Exercise Vital Sign    Days of Exercise per Week: 0 days    Minutes of Exercise per Session: 0 min  Stress: No Stress Concern Present (06/11/2022)   [place]    Feeling of Stress : Not at all  Social Connections: Socially Isolated (04/18/2020)   Social Connection and Isolation Panel [NHANES]    Frequency of Communication with Friends and Family: More than three times a week    Frequency of Social Gatherings with Friends and Family: Once a week    Attends Religious Services: Never    Marine scientist or Organizations: No    Attends Music therapist: Never    Marital Status: Divorced    Tobacco Counseling Counseling given: Not Answered   Clinical Intake:  Pre-visit preparation completed: Yes  Pain : No/denies pain     Nutritional Status: BMI 25 -29 Overweight Nutritional Risks: None Diabetes: No  How often do you need to have someone help you when you read instructions, pamphlets, or other written materials from your doctor or pharmacy?: 1 - Never What is the last grade level you completed in school?: 12th grade  Diabetic? no  Interpreter Needed?: No  Information entered by :: NAllen LPN   Activities of Daily Living    06/11/2022   10:55 AM  In your present state of health, do you have any difficulty performing the following activities:  Hearing? 0  Vision? 0  Difficulty concentrating or making decisions? 0  Walking or climbing stairs? 0  Dressing or bathing? 0  Doing errands, shopping? 0  Preparing Food and eating ? N  Using the Toilet? N  In the past six months, have you accidently leaked urine? N  Do you have problems with loss of bowel control? N  Managing your Medications? N  Managing your Finances? N  Housekeeping or managing your Housekeeping? N    Patient Care  Team: Adrienne Brine, FNP as PCP - General (General Practice) Adrienne Pickett Nadean Corwin, MD as PCP - Cardiology (Cardiology)  Indicate any recent Medical Services you may have received from other than [facility] providers in the past year (date may be approximate).     Assessment:   This is a routine wellness examination for Adrienne Lopez.  Hearing/Vision screen Vision Screening - Comments:: Regular eye exams, Dr. Venetia Maxon  Dietary issues and exercise activities discussed: Current Exercise Habits: The patient does not participate in regular exercise at present   Goals Addressed  This Visit's Progress    Patient Stated       06/11/2022, no goals       Depression Screen    06/11/2022   10:55 AM 05/01/2022    9:42 AM 05/16/2021    8:47 AM 04/24/2021    9:12 AM 04/18/2020    9:29 AM  PHQ 2/9 Scores  PHQ - 2 Score 0 0 0 0 0    Fall Risk    06/11/2022   10:55 AM 05/01/2022    9:41 AM 05/16/2021    8:47 AM 04/24/2021    9:12 AM 04/18/2020    9:29 AM  Fall Risk   Falls in the past year? 0 0 0 0 0  Number falls in past yr: 0 0  0   Injury with Fall? 0 0  0   Risk for fall due to : Medication side effect No Fall Risks Medication side effect    Follow up Falls prevention discussed;Education provided;Falls evaluation completed Falls evaluation completed Falls evaluation completed;Education provided;Falls prevention discussed      FALL RISK PREVENTION PERTAINING TO THE HOME:  Any stairs in or around the home? Yes  If so, are there any without handrails? No  Home free of loose throw rugs in walkways, pet beds, electrical cords, etc? Yes  Adequate lighting in your home to reduce risk of falls? Yes   ASSISTIVE DEVICES UTILIZED TO PREVENT FALLS:  Life alert? No  Use of a cane, walker or w/c? No  Grab bars in the bathroom? No  Shower chair or bench in shower? No  Elevated toilet seat or a handicapped toilet? No   TIMED UP AND GO:  Was the test performed? Yes .  Length of time to  ambulate 10 feet: 5 sec.   Gait steady and fast without use of assistive device  Cognitive Function:        06/11/2022   10:55 AM 05/16/2021    8:48 AM 04/18/2020    9:31 AM  6CIT Screen  What Year? 0 points 0 points 0 points  What month? 0 points 0 points 0 points  What time? 0 points 0 points 3 points  Count back from 20 0 points 0 points 0 points  Months in reverse 0 points 0 points 0 points  Repeat phrase 0 points 0 points 0 points  Total Score 0 points 0 points 3 points    Immunizations Immunization History  Administered Date(s) Administered   PFIZER(Purple Top)SARS-COV-2 Vaccination 10/31/2019, 11/29/2019, 07/14/2020, 06/13/2021   Pneumococcal Conjugate-13 04/28/2020   Pneumococcal Polysaccharide-23 05/02/2021   Tdap 05/18/2020   Zoster Recombinat (Shingrix) 05/23/2021, 08/28/2021    TDAP status: Up to date  Flu Vaccine status: Declined, Education has been provided regarding the importance of this vaccine but patient still declined. Advised may receive this vaccine at local pharmacy or Health Dept. Aware to provide a copy of the vaccination record if obtained from local pharmacy or Health Dept. Verbalized acceptance and understanding.  Pneumococcal vaccine status: Up to date  Covid-19 vaccine status: Completed vaccines  Qualifies for Shingles Vaccine? Yes   Zostavax completed Yes   Shingrix Completed?: Yes  Screening Tests Health Maintenance  Topic Date Due   COVID-19 Vaccine (5 - Pfizer risk series) 08/08/2021   INFLUENZA VACCINE  11/30/2022 (Originally 04/01/2022)   MAMMOGRAM  09/17/2022   COLONOSCOPY (Pts 45-89yr Insurance coverage will need to be confirmed)  04/20/2023   TETANUS/TDAP  05/18/2030   Pneumonia Vaccine 71 Years old  Completed  DEXA SCAN  Completed   Hepatitis C Screening  Completed   Zoster Vaccines- Shingrix  Completed   HPV VACCINES  Aged Out    Health Maintenance  Health Maintenance Due  Topic Date Due   COVID-19 Vaccine (5 -  Pfizer risk series) 08/08/2021    Colorectal cancer screening: Type of screening: Colonoscopy. Completed 04/19/2018. Repeat every 5 years  Mammogram status: Completed 09/17/2021. Repeat every year  Bone Density status: Completed 06/25/2020.   Lung Cancer Screening: (Low Dose CT Chest recommended if Age 54-80 years, 30 pack-year currently smoking OR have quit w/in 15years.) does not qualify.   Lung Cancer Screening Referral: no  Additional Screening:  Hepatitis C Screening: does qualify; Completed 04/18/2020  Vision Screening: Recommended annual ophthalmology exams for early detection of glaucoma and other disorders of the eye. Is the patient up to date with their annual eye exam?  Yes  Who is the provider or what is the name of the office in which the patient attends annual eye exams? Dr. Venetia Maxon If pt is not established with a provider, would they like to be referred to a provider to establish care? No .   Dental Screening: Recommended annual dental exams for proper oral hygiene  Community Resource Referral / Chronic Care Management: CRR required this visit?  No   CCM required this visit?  No      Plan:     I have personally reviewed and noted the following in the patient's chart:   Medical and social history Use of alcohol, tobacco or illicit drugs  Current medications and supplements including opioid prescriptions. Patient is not currently taking opioid prescriptions. Functional ability and status Nutritional status Physical activity Advanced directives List of other physicians Hospitalizations, surgeries, and ER visits in previous 12 months Vitals Screenings to include cognitive, depression, and falls Referrals and appointments  In addition, I have reviewed and discussed with patient certain preventive protocols, quality metrics, and best practice recommendations. A written personalized care plan for preventive services as well as general preventive health  recommendations were provided to patient.     Kellie Simmering, LPN   [id number]   Nurse Notes: none

## 2022-06-11 NOTE — Patient Instructions (Signed)
[name] , Thank you for taking time to come for your Medicare Wellness Visit. I appreciate your ongoing commitment to your health goals. Please review the following plan we discussed and let me know if I can assist you in the future.   Screening recommendations/referrals: Colonoscopy: completed 04/19/2018, due 04/20/2023 Mammogram: completed 09/17/2021, due 09/18/2022 Bone Density: completed 06/25/2020 Recommended yearly ophthalmology/optometry visit for glaucoma screening and checkup Recommended yearly dental visit for hygiene and checkup  Vaccinations: Influenza vaccine: decline Pneumococcal vaccine: completed 05/02/2021 Tdap vaccine: completed 05/18/2020, due 05/18/2030 Shingles vaccine: completed   Covid-19: 06/13/2021, 07/14/2020, 11/29/2019, 10/31/2019  Advanced directives: Advance directive discussed with you today. Even though you declined this today please call our office should you change your mind and we can give you the proper paperwork for you to fill out.  Conditions/risks identified: none  Next appointment: Follow up in one year for your annual wellness visit    Preventive Care 65 Years and Older, Female Preventive care refers to lifestyle choices and visits with your health care provider that can promote health and wellness. What does preventive care include? A yearly physical exam. This is also called an annual well check. Dental exams once or twice a year. Routine eye exams. Ask your health care provider how often you should have your eyes checked. Personal lifestyle choices, including: Daily care of your teeth and gums. Regular physical activity. Eating a healthy diet. Avoiding tobacco and drug use. Limiting alcohol use. Practicing safe sex. Taking low-dose aspirin every day. Taking vitamin and mineral supplements as recommended by your health care provider. What happens during an annual well check? The services and screenings done by your health care provider  during your annual well check will depend on your age, overall health, lifestyle risk factors, and family history of disease. Counseling  Your health care provider may ask you questions about your: Alcohol use. Tobacco use. Drug use. Emotional well-being. Home and relationship well-being. Sexual activity. Eating habits. History of falls. Memory and ability to understand (cognition). Work and work Statistician. Reproductive health. Screening  You may have the following tests or measurements: Height, weight, and BMI. Blood pressure. Lipid and cholesterol levels. These may be checked every 5 years, or more frequently if you are over 87 years old. Skin check. Lung cancer screening. You may have this screening every year starting at age 59 if you have a 30-pack-year history of smoking and currently smoke or have quit within the past 15 years. Fecal occult blood test (FOBT) of the stool. You may have this test every year starting at age 26. Flexible sigmoidoscopy or colonoscopy. You may have a sigmoidoscopy every 5 years or a colonoscopy every 10 years starting at age 85. Hepatitis C blood test. Hepatitis B blood test. Sexually transmitted disease (STD) testing. Diabetes screening. This is done by checking your blood sugar (glucose) after you have not eaten for a while (fasting). You may have this done every 1-3 years. Bone density scan. This is done to screen for osteoporosis. You may have this done starting at age 43. Mammogram. This may be done every 1-2 years. Talk to your health care provider about how often you should have regular mammograms. Talk with your health care provider about your test results, treatment options, and if necessary, the need for more tests. Vaccines  Your health care provider may recommend certain vaccines, such as: Influenza vaccine. This is recommended every year. Tetanus, diphtheria, and acellular pertussis (Tdap, Td) vaccine. You may need a Td  booster every  10 years. Zoster vaccine. You may need this after age 52. Pneumococcal 13-valent conjugate (PCV13) vaccine. One dose is recommended after age 41. Pneumococcal polysaccharide (PPSV23) vaccine. One dose is recommended after age 58. Talk to your health care provider about which screenings and vaccines you need and how often you need them. This information is not intended to replace advice given to you by your health care provider. Make sure you discuss any questions you have with your health care provider. Document Released: 09/14/2015 Document Revised: 05/07/2016 Document Reviewed: 06/19/2015 Elsevier Interactive Patient Education  2017 [place] Prevention in the Home Falls can cause injuries. They can happen to people of all ages. There are many things you can do to make your home safe and to help prevent falls. What can I do on the outside of my home? Regularly fix the edges of walkways and driveways and fix any cracks. Remove anything that might make you trip as you walk through a door, such as a raised step or threshold. Trim any bushes or trees on the path to your home. Use bright outdoor lighting. Clear any walking paths of anything that might make someone trip, such as rocks or tools. Regularly check to see if handrails are loose or broken. Make sure that both sides of any steps have handrails. Any raised decks and porches should have guardrails on the edges. Have any leaves, snow, or ice cleared regularly. Use sand or salt on walking paths during winter. Clean up any spills in your garage right away. This includes oil or grease spills. What can I do in the bathroom? Use night lights. Install grab bars by the toilet and in the tub and shower. Do not use towel bars as grab bars. Use non-skid mats or decals in the tub or shower. If you need to sit down in the shower, use a plastic, non-slip stool. Keep the floor dry. Clean up any water that spills on the floor as soon as it  happens. Remove soap buildup in the tub or shower regularly. Attach bath mats securely with double-sided non-slip rug tape. Do not have throw rugs and other things on the floor that can make you trip. What can I do in the bedroom? Use night lights. Make sure that you have a light by your bed that is easy to reach. Do not use any sheets or blankets that are too big for your bed. They should not hang down onto the floor. Have a firm chair that has side arms. You can use this for support while you get dressed. Do not have throw rugs and other things on the floor that can make you trip. What can I do in the kitchen? Clean up any spills right away. Avoid walking on wet floors. Keep items that you use a lot in easy-to-reach places. If you need to reach something above you, use a strong step stool that has a grab bar. Keep electrical cords out of the way. Do not use floor polish or wax that makes floors slippery. If you must use wax, use non-skid floor wax. Do not have throw rugs and other things on the floor that can make you trip. What can I do with my stairs? Do not leave any items on the stairs. Make sure that there are handrails on both sides of the stairs and use them. Fix handrails that are broken or loose. Make sure that handrails are as long as the stairways. Check any carpeting  to make sure that it is firmly attached to the stairs. Fix any carpet that is loose or worn. Avoid having throw rugs at the top or bottom of the stairs. If you do have throw rugs, attach them to the floor with carpet tape. Make sure that you have a light switch at the top of the stairs and the bottom of the stairs. If you do not have them, ask someone to add them for you. What else can I do to help prevent falls? Wear shoes that: Do not have high heels. Have rubber bottoms. Are comfortable and fit you well. Are closed at the toe. Do not wear sandals. If you use a stepladder: Make sure that it is fully opened.  Do not climb a closed stepladder. Make sure that both sides of the stepladder are locked into place. Ask someone to hold it for you, if possible. Clearly mark and make sure that you can see: Any grab bars or handrails. First and last steps. Where the edge of each step is. Use tools that help you move around (mobility aids) if they are needed. These include: Canes. Walkers. Scooters. Crutches. Turn on the lights when you go into a dark area. Replace any light bulbs as soon as they burn out. Set up your furniture so you have a clear path. Avoid moving your furniture around. If any of your floors are uneven, fix them. If there are any pets around you, be aware of where they are. Review your medicines with your doctor. Some medicines can make you feel dizzy. This can increase your chance of falling. Ask your doctor what other things that you can do to help prevent falls. This information is not intended to replace advice given to you by your health care provider. Make sure you discuss any questions you have with your health care provider. Document Released: 06/14/2009 Document Revised: 01/24/2016 Document Reviewed: 09/22/2014 Elsevier Interactive Patient Education  2017 [employer].

## 2022-06-12 LAB — CMP14+EGFR
ALT: 35 IU/L — ABNORMAL HIGH (ref 0–32)
AST: 49 IU/L — ABNORMAL HIGH (ref 0–40)
Albumin/Globulin Ratio: 1.2 (ref 1.2–2.2)
Albumin: 4.6 g/dL (ref 3.8–4.8)
Alkaline Phosphatase: 68 IU/L (ref 44–121)
BUN/Creatinine Ratio: 35 — ABNORMAL HIGH (ref 12–28)
BUN: 38 mg/dL — ABNORMAL HIGH (ref 8–27)
Bilirubin Total: 0.5 mg/dL (ref 0.0–1.2)
CO2: 21 mmol/L (ref 20–29)
Calcium: 10.2 mg/dL (ref 8.7–10.3)
Chloride: 101 mmol/L (ref 96–106)
Creatinine, Ser: 1.09 mg/dL — ABNORMAL HIGH (ref 0.57–1.00)
Globulin, Total: 3.8 g/dL (ref 1.5–4.5)
Glucose: 81 mg/dL (ref 70–99)
Potassium: 4.4 mmol/L (ref 3.5–5.2)
Sodium: 142 mmol/L (ref 134–144)
Total Protein: 8.4 g/dL (ref 6.0–8.5)
eGFR: 54 mL/min/{1.73_m2} — ABNORMAL LOW (ref >59)

## 2022-06-12 LAB — LIPID PANEL
Chol/HDL Ratio: 2.4 ratio (ref 0.0–4.4)
Cholesterol, Total: 170 mg/dL (ref 100–199)
HDL: 71 mg/dL (ref >39)
LDL Chol Calc (NIH): 86 mg/dL (ref 0–99)
Triglycerides: 68 mg/dL (ref 0–149)
VLDL Cholesterol Cal: 13 mg/dL (ref 5–40)

## 2022-06-16 ENCOUNTER — Encounter: Payer: Self-pay | Admitting: Internal Medicine

## 2022-06-16 ENCOUNTER — Ambulatory Visit
Admission: RE | Admit: 2022-06-16 | Discharge: 2022-06-16 | Disposition: A | Discharge status: Home / Self Care | Payer: Medicare Other | Source: Ambulatory Visit | Attending: Nurse Practitioner | Admitting: Nurse Practitioner

## 2022-06-16 ENCOUNTER — Encounter: Payer: Self-pay | Admitting: Nurse Practitioner

## 2022-06-16 ENCOUNTER — Other Ambulatory Visit: Payer: Self-pay | Admitting: Nurse Practitioner

## 2022-06-16 DIAGNOSIS — I10 Essential (primary) hypertension: Secondary | ICD-10-CM

## 2022-06-16 DIAGNOSIS — R945 Abnormal results of liver function studies: Secondary | ICD-10-CM | POA: Diagnosis not present

## 2022-06-16 DIAGNOSIS — R748 Abnormal levels of other serum enzymes: Secondary | ICD-10-CM

## 2022-06-25 ENCOUNTER — Other Ambulatory Visit: Payer: Self-pay | Admitting: Nurse Practitioner

## 2022-06-25 DIAGNOSIS — R935 Abnormal findings on diagnostic imaging of other abdominal regions, including retroperitoneum: Secondary | ICD-10-CM

## 2022-06-25 DIAGNOSIS — R748 Abnormal levels of other serum enzymes: Secondary | ICD-10-CM

## 2022-07-08 DIAGNOSIS — I10 Essential (primary) hypertension: Secondary | ICD-10-CM | POA: Diagnosis not present

## 2022-07-09 ENCOUNTER — Encounter: Payer: Self-pay | Admitting: Nurse Practitioner

## 2022-07-09 LAB — BASIC METABOLIC PANEL
BUN/Creatinine Ratio: 20 (ref 12–28)
BUN: 21 mg/dL (ref 8–27)
CO2: 23 mmol/L (ref 20–29)
Calcium: 9.7 mg/dL (ref 8.7–10.3)
Chloride: 100 mmol/L (ref 96–106)
Creatinine, Ser: 1.06 mg/dL — ABNORMAL HIGH (ref 0.57–1.00)
Glucose: 77 mg/dL (ref 70–99)
Potassium: 3.6 mmol/L (ref 3.5–5.2)
Sodium: 138 mmol/L (ref 134–144)
eGFR: 55 mL/min/{1.73_m2} — ABNORMAL LOW (ref >59)

## 2022-07-10 ENCOUNTER — Encounter: Payer: Self-pay | Admitting: Nurse Practitioner

## 2022-07-19 ENCOUNTER — Ambulatory Visit
Admission: RE | Admit: 2022-07-19 | Discharge: 2022-07-19 | Disposition: A | Discharge status: Home / Self Care | Payer: Medicare Other | Source: Ambulatory Visit | Attending: Nurse Practitioner | Admitting: Nurse Practitioner

## 2022-07-19 DIAGNOSIS — R7989 Other specified abnormal findings of blood chemistry: Secondary | ICD-10-CM | POA: Diagnosis not present

## 2022-07-19 DIAGNOSIS — R748 Abnormal levels of other serum enzymes: Secondary | ICD-10-CM

## 2022-07-19 DIAGNOSIS — R935 Abnormal findings on diagnostic imaging of other abdominal regions, including retroperitoneum: Secondary | ICD-10-CM

## 2022-07-19 DIAGNOSIS — K862 Cyst of pancreas: Secondary | ICD-10-CM | POA: Diagnosis not present

## 2022-07-19 MED ORDER — GADOPICLENOL 0.5 MMOL/ML IV SOLN
6.0000 mL | Freq: Once | INTRAVENOUS | Status: AC | PRN
  Administered 2022-07-19: 6 mL via INTRAVENOUS

## 2022-07-27 ENCOUNTER — Other Ambulatory Visit: Payer: Self-pay | Admitting: Internal Medicine

## 2022-07-29 ENCOUNTER — Encounter: Payer: Self-pay | Admitting: Nurse Practitioner

## 2022-09-11 DIAGNOSIS — H43813 Vitreous degeneration, bilateral: Secondary | ICD-10-CM | POA: Diagnosis not present

## 2022-09-11 DIAGNOSIS — H2513 Age-related nuclear cataract, bilateral: Secondary | ICD-10-CM | POA: Diagnosis not present

## 2022-09-17 ENCOUNTER — Ambulatory Visit: Payer: Medicare Other | Admitting: Internal Medicine

## 2022-09-23 DIAGNOSIS — Z1231 Encounter for screening mammogram for malignant neoplasm of breast: Secondary | ICD-10-CM | POA: Diagnosis not present

## 2022-09-23 LAB — HM MAMMOGRAPHY

## 2022-09-24 ENCOUNTER — Encounter: Payer: Self-pay | Admitting: Nurse Practitioner

## 2022-10-20 ENCOUNTER — Encounter: Payer: Self-pay | Admitting: Nurse Practitioner

## 2022-10-29 ENCOUNTER — Ambulatory Visit (INDEPENDENT_AMBULATORY_CARE_PROVIDER_SITE_OTHER): Payer: Medicare Other | Admitting: Nurse Practitioner

## 2022-10-29 ENCOUNTER — Encounter: Payer: Self-pay | Admitting: Nurse Practitioner

## 2022-10-29 VITALS — BP 124/72 | HR 60 | Temp 97.8°F | Ht 60.0 in | Wt 138.0 lb

## 2022-10-29 DIAGNOSIS — E782 Mixed hyperlipidemia: Secondary | ICD-10-CM

## 2022-10-29 DIAGNOSIS — R932 Abnormal findings on diagnostic imaging of liver and biliary tract: Secondary | ICD-10-CM | POA: Diagnosis not present

## 2022-10-29 DIAGNOSIS — E210 Primary hyperparathyroidism: Secondary | ICD-10-CM

## 2022-10-29 DIAGNOSIS — R7401 Elevation of levels of liver transaminase levels: Secondary | ICD-10-CM | POA: Diagnosis not present

## 2022-10-29 DIAGNOSIS — R748 Abnormal levels of other serum enzymes: Secondary | ICD-10-CM | POA: Insufficient documentation

## 2022-10-29 DIAGNOSIS — R7309 Other abnormal glucose: Secondary | ICD-10-CM | POA: Diagnosis not present

## 2022-10-29 DIAGNOSIS — I10 Essential (primary) hypertension: Secondary | ICD-10-CM | POA: Diagnosis not present

## 2022-10-29 DIAGNOSIS — D239 Other benign neoplasm of skin, unspecified: Secondary | ICD-10-CM | POA: Insufficient documentation

## 2022-10-29 HISTORY — DX: Abnormal levels of other serum enzymes: R74.8

## 2022-10-29 NOTE — Progress Notes (Signed)
I,Sheena H Holbrook,acting as a Education administrator for [name], FNP.,have documented all relevant documentation on the behalf of [name], FNP,as directed by  [name], FNP while in the presence of [name], [place].    Subjective:     Patient ID: Adrienne Lopez , female    DOB: 04-01-1951 , 72 y.o.   MRN: [id number]   Chief Complaint  Patient presents with   Hypertension    HPI  Patient presents today for htn follow up. Patient has no other complaints or concerns. She does not have a history of alcohol intake. Denies abdomen pain or blood in stool. Denies family history of liver issues    Hypertension This is a chronic problem. The current episode started more than 1 year ago. The problem is unchanged. The problem is controlled. Pertinent negatives include no anxiety. There are no associated agents to hypertension. Risk factors for coronary artery disease include sedentary lifestyle. Past treatments include beta blockers. There are no compliance problems.  There is no history of angina. There is no history of chronic renal disease.     Past Medical History:  Diagnosis Date   Cancer [facility]) 1999   breast, left   Hyperparathyroidism, primary ([place])    Osteoporosis    TIA (transient ischemic attack) 12/26/2000   NO RESIDUAL PROBLEM     Family History  Problem Relation Age of Onset   Hypertension Mother    Prostate cancer Father    Heart attack Maternal Grandmother    Cancer Maternal Grandfather        oral cancer     Current Outpatient Medications:    Ascorbic Acid (VITAMIN C) 1000 MG tablet, Take 1,000 mg by mouth daily., Disp: , Rfl:    aspirin 81 MG tablet, Take 81 mg by mouth daily., Disp: , Rfl:    atenolol-chlorthalidone (TENORETIC) 100-25 MG tablet, Take 1/2 (one-half) tablet by mouth once daily, Disp: 45 tablet, Rfl: 3   atorvastatin (LIPITOR) 20 MG tablet, Take 1 tablet (20 mg total) by mouth daily., Disp: 90 tablet, Rfl: 1   B Complex-C (B-COMPLEX WITH VITAMIN C)  tablet, Take 1 tablet by mouth daily. '100mg'$ , Disp: , Rfl:    BIOTIN 5000 PO, Take 1 tablet by mouth daily. , Disp: , Rfl:    cholecalciferol (VITAMIN D) 1000 UNITS tablet, Take 5,000 Units by mouth daily., Disp: , Rfl:    Cyanocobalamin (VITAMIN B-12 PO), Take 2,500 mg by mouth daily., Disp: , Rfl:    famotidine (PEPCID) 20 MG tablet, Take 20 mg by mouth as needed., Disp: , Rfl:    Multiple Vitamin (MULTIVITAMIN) capsule, Take 1 capsule by mouth daily., Disp: , Rfl:    No Known Allergies   Review of Systems  Constitutional: Negative.   Respiratory: Negative.    Cardiovascular: Negative.   Gastrointestinal: Negative.   Musculoskeletal: Negative.   Neurological: Negative.   Psychiatric/Behavioral: Negative.       Today's Vitals   10/29/22 0954  BP: 124/72  Pulse: 60  Temp: 97.8 F (36.6 C)  TempSrc: Oral  SpO2: 98%  Weight: 138 lb (62.6 kg)  Height: 5' (1.524 m)   Body mass index is 26.95 kg/m.   Objective:  Physical Exam Vitals reviewed.  Constitutional:      General: She is not in acute distress.    Appearance: Normal appearance.  Cardiovascular:     Rate and Rhythm: Normal rate and regular rhythm.     Pulses: Normal pulses.     Heart  sounds: Normal heart sounds. No murmur heard. Pulmonary:     Effort: Pulmonary effort is normal. No respiratory distress.     Breath sounds: Normal breath sounds. No wheezing.  Neurological:     Mental Status: She is alert.         Assessment And Plan:     1. Essential hypertension Comments: Blood pressure is controlled.  Continue current medications  2. Mixed hyperlipidemia Comments: Cholesterol levels are stable.  Continue current medications and focusing on low-fat diet. - Lipid panel  3. Abnormal glucose Comments: Hemoglobin A1c is stable, continue diet control - Hemoglobin A1c  4. Elevated liver enzymes Comments: Liver enzymes remain slightly elevated will order additional labs.  And refer to liver specialist.   Established at [place] referral to Gastroenterology - Acute Viral Hepatitis (HAV, HBV, HCV) - Hepatic function panel  5. Abnormal MRI, liver Comments: Iron deposits seen in liver with her MRI - CMP14+EGFR - Gamma GT - Ambulatory referral to Gastroenterology - Acute Viral Hepatitis (HAV, HBV, HCV) - Hepatic function panel  6. Hyperparathyroidism, primary ([place]) Comments: Stable.     Patient was given opportunity to ask questions. Patient verbalized understanding of the plan and was able to repeat key elements of the plan. All questions were answered to their satisfaction.  [name], FNP   I, [name], FNP, have reviewed all documentation for this visit. The documentation on 10/29/22 for the exam, diagnosis, procedures, and orders are all accurate and complete.   IF YOU HAVE BEEN REFERRED TO A SPECIALIST, IT MAY TAKE 1-2 WEEKS TO SCHEDULE/PROCESS THE REFERRAL. IF YOU HAVE NOT HEARD FROM US/SPECIALIST IN TWO WEEKS, PLEASE GIVE [place] A CALL AT [phone] X [phone].   THE PATIENT IS ENCOURAGED TO PRACTICE SOCIAL DISTANCING DUE TO THE COVID-19 PANDEMIC.

## 2022-10-29 NOTE — Patient Instructions (Signed)
Some great foods to help cleanse and support liver function include leafy greens like spinach and kale, avocados, broccoli, cauliflower, garlic, grapefruit, green tea, turmeric, apples, olive oil, citrus fruits, beets, and cruciferous vegetables like Brussels sprouts and cabbage.

## 2022-10-30 LAB — LIPID PANEL
Chol/HDL Ratio: 2.3 ratio (ref 0.0–4.4)
Cholesterol, Total: 173 mg/dL (ref 100–199)
HDL: 74 mg/dL (ref >39)
LDL Chol Calc (NIH): 87 mg/dL (ref 0–99)
Triglycerides: 62 mg/dL (ref 0–149)
VLDL Cholesterol Cal: 12 mg/dL (ref 5–40)

## 2022-10-30 LAB — CMP14+EGFR
ALT: 48 IU/L — ABNORMAL HIGH (ref 0–32)
AST: 35 IU/L (ref 0–40)
Albumin/Globulin Ratio: 1.2 (ref 1.2–2.2)
Albumin: 4.5 g/dL (ref 3.8–4.8)
Alkaline Phosphatase: 65 IU/L (ref 44–121)
BUN/Creatinine Ratio: 28 (ref 12–28)
BUN: 28 mg/dL — ABNORMAL HIGH (ref 8–27)
Bilirubin Total: 0.5 mg/dL (ref 0.0–1.2)
CO2: 24 mmol/L (ref 20–29)
Calcium: 9.8 mg/dL (ref 8.7–10.3)
Chloride: 101 mmol/L (ref 96–106)
Creatinine, Ser: 1 mg/dL (ref 0.57–1.00)
Globulin, Total: 3.7 g/dL (ref 1.5–4.5)
Glucose: 90 mg/dL (ref 70–99)
Potassium: 3.9 mmol/L (ref 3.5–5.2)
Sodium: 141 mmol/L (ref 134–144)
Total Protein: 8.2 g/dL (ref 6.0–8.5)
eGFR: 59 mL/min/{1.73_m2} — ABNORMAL LOW (ref >59)

## 2022-10-30 LAB — HCV INTERPRETATION

## 2022-10-30 LAB — ACUTE VIRAL HEPATITIS (HAV, HBV, HCV)
HCV Ab: NONREACTIVE
Hep A IgM: NEGATIVE
Hep B C IgM: NEGATIVE
Hepatitis B Surface Ag: NEGATIVE

## 2022-10-30 LAB — HEPATIC FUNCTION PANEL
ALT: 47 IU/L — ABNORMAL HIGH (ref 0–32)
AST: 38 IU/L (ref 0–40)
Albumin: 4.4 g/dL (ref 3.8–4.8)
Alkaline Phosphatase: 63 IU/L (ref 44–121)
Bilirubin Total: 0.5 mg/dL (ref 0.0–1.2)
Bilirubin, Direct: 0.13 mg/dL (ref 0.00–0.40)
Total Protein: 8.1 g/dL (ref 6.0–8.5)

## 2022-10-30 LAB — GAMMA GT: GGT: 91 IU/L — ABNORMAL HIGH (ref 0–60)

## 2022-10-30 LAB — HEMOGLOBIN A1C
Est. average glucose Bld gHb Est-mCnc: 117 mg/dL
Hgb A1c MFr Bld: 5.7 % — ABNORMAL HIGH (ref 4.8–5.6)

## 2022-10-31 ENCOUNTER — Encounter: Payer: Self-pay | Admitting: Nurse Practitioner

## 2022-11-03 NOTE — Telephone Encounter (Signed)
FYI thanks.

## 2022-12-05 ENCOUNTER — Ambulatory Visit (HOSPITAL_COMMUNITY)
Admission: EM | Admit: 2022-12-05 | Discharge: 2022-12-05 | Disposition: A | Discharge status: Home / Self Care | Payer: Medicare Other | Attending: Urgent Care | Admitting: Urgent Care

## 2022-12-05 ENCOUNTER — Encounter (HOSPITAL_COMMUNITY): Payer: Self-pay

## 2022-12-05 ENCOUNTER — Ambulatory Visit (INDEPENDENT_AMBULATORY_CARE_PROVIDER_SITE_OTHER): Payer: Medicare Other

## 2022-12-05 DIAGNOSIS — M79672 Pain in left foot: Secondary | ICD-10-CM

## 2022-12-05 DIAGNOSIS — M7989 Other specified soft tissue disorders: Secondary | ICD-10-CM | POA: Diagnosis not present

## 2022-12-05 DIAGNOSIS — M109 Gout, unspecified: Secondary | ICD-10-CM | POA: Diagnosis not present

## 2022-12-05 DIAGNOSIS — I10 Essential (primary) hypertension: Secondary | ICD-10-CM | POA: Diagnosis not present

## 2022-12-05 MED ORDER — PREDNISONE 10 MG PO TABS: 30.0000 mg | ORAL_TABLET | Freq: Every day | ORAL | 0 refills | Status: DC

## 2022-12-05 NOTE — ED Triage Notes (Signed)
Pt is here for joint pain on the left foot , causing pain and swelling, pt has tried ibuprofen and warm soaks.. Last dose of ibuprofen was last night around 10pm

## 2022-12-05 NOTE — ED Provider Notes (Signed)
[facility]   MRN: [id number] DOB: 24-Dec-1950  Subjective:   Adrienne Lopez is a 72 y.o. female presenting for 2 to 3-day history of acute onset persistent left great toe pain with swelling, left distal foot pain.  Patient has concerns about gout as it runs in her family extensively.  She herself has never had an episode.  Reports that her symptoms happened overnight when she was sleeping.  No particular trauma, fall.  No history of arthritis to her knowledge.  She does have osteoporosis however.  No fever, drainage of pus or bleeding, wounds.  No diabetes.  Patient did take some ibuprofen last night and feels like it is better today but still has the swelling and pain.  Regarding her blood pressure, has history of HTN, takes atenolol-chlorthalidone for this.   No current facility-administered medications for this encounter.  Current Outpatient Medications:    Ascorbic Acid (VITAMIN C) 1000 MG tablet, Take 1,000 mg by mouth daily., Disp: , Rfl:    aspirin 81 MG tablet, Take 81 mg by mouth daily., Disp: , Rfl:    atenolol-chlorthalidone (TENORETIC) 100-25 MG tablet, Take 1/2 (one-half) tablet by mouth once daily, Disp: 45 tablet, Rfl: 3   B Complex-C (B-COMPLEX WITH VITAMIN C) tablet, Take 1 tablet by mouth daily. 100mg , Disp: , Rfl:    BIOTIN 5000 PO, Take 1 tablet by mouth daily. , Disp: , Rfl:    cholecalciferol (VITAMIN D) 1000 UNITS tablet, Take 5,000 Units by mouth daily., Disp: , Rfl:    Cyanocobalamin (VITAMIN B-12 PO), Take 2,500 mg by mouth daily., Disp: , Rfl:    famotidine (PEPCID) 20 MG tablet, Take 20 mg by mouth as needed., Disp: , Rfl:    Multiple Vitamin (MULTIVITAMIN) capsule, Take 1 capsule by mouth daily., Disp: , Rfl:    atorvastatin (LIPITOR) 20 MG tablet, Take 1 tablet (20 mg total) by mouth daily., Disp: 90 tablet, Rfl: 1   No Known Allergies  Past Medical History:  Diagnosis Date   Cancer 1999   breast, left   Hyperparathyroidism, primary     Osteoporosis    TIA (transient ischemic attack) 12/26/2000   NO RESIDUAL PROBLEM     Past Surgical History:  Procedure Laterality Date   ABDOMINAL HYSTERECTOMY     BREAST LUMPECTOMY  1999   left   PARATHYROIDECTOMY N/A 05/06/2013   Procedure: PARATHYROIDECTOMY;  Surgeon: Velora Heckler, MD;  Location: [facility];  Service: General;  Laterality: N/A;   PARTIAL HYSTERECTOMY  1991    Family History  Problem Relation Age of Onset   Hypertension Mother    Prostate cancer Father    Heart attack Maternal Grandmother    Cancer Maternal Grandfather        oral cancer    Social History   Tobacco Use   Smoking status: Never   Smokeless tobacco: Never  Vaping Use   Vaping Use: Never used  Substance Use Topics   Alcohol use: No   Drug use: No    ROS   Objective:   Vitals: BP (!) 181/97 (BP Location: Right Arm)   Pulse (!) 54   Temp 98 F (36.7 C) (Oral)   Resp 12   SpO2 96%   BP Readings from Last 3 Encounters:  12/05/22 (!) 181/97  10/29/22 124/72  06/11/22 136/70   Physical Exam Constitutional:      General: She is not in acute distress.    Appearance: Normal appearance. She is  well-developed. She is not ill-appearing, toxic-appearing or diaphoretic.  HENT:     Head: Normocephalic and atraumatic.     Nose: Nose normal.     Mouth/Throat:     Mouth: Mucous membranes are moist.  Eyes:     General: No scleral icterus.       Right eye: No discharge.        Left eye: No discharge.     Extraocular Movements: Extraocular movements intact.  Cardiovascular:     Rate and Rhythm: Normal rate.  Pulmonary:     Effort: Pulmonary effort is normal.  Musculoskeletal:       Feet:  Skin:    General: Skin is warm and dry.  Neurological:     General: No focal deficit present.     Mental Status: She is alert and oriented to person, place, and time.  Psychiatric:        Mood and Affect: Mood normal.        Behavior: Behavior normal.    DG Foot Complete Left  Result Date:  12/05/2022 CLINICAL DATA:  Left foot pain and swelling EXAM: LEFT FOOT - COMPLETE 3+ VIEW COMPARISON:  None Available. FINDINGS: Hallux valgus with bony bunion. Mild overlying medial soft tissue swelling. Lucency along the distal margin of the head of the first metatarsal, favoring erosion over degenerative subcortical cyst. This could be secondary to gout arthropathy or possibly rheumatoid arthropathy. No fracture or malalignment. Mild degenerative spurring of the first digit sesamoids. IMPRESSION: 1. Hallux valgus with bony bunion. 2. Lucency along the distal margin of the head of the first metatarsal, favoring erosion over degenerative subcortical cyst. This could be secondary to gout arthropathy or possibly rheumatoid arthropathy. Electronically Signed   By: Gaylyn Rong M.D.   On: 12/05/2022 11:40    Assessment and Plan :   PDMP not reviewed this encounter.  1. Acute gout involving toe of left foot, unspecified cause   2. Acute foot pain, left   3. Essential hypertension    Will manage for gout with an oral prednisone course to avoid the risks of a thrombotic event using NSAIDs.  Discussed general management of gout. Counseled patient on potential for adverse effects with medications prescribed/recommended today, ER and return-to-clinic precautions discussed, patient verbalized understanding.    Wallis Bamberg, PA-C 12/05/22 1146

## 2023-01-28 DIAGNOSIS — D1803 Hemangioma of intra-abdominal structures: Secondary | ICD-10-CM | POA: Diagnosis not present

## 2023-01-28 DIAGNOSIS — R748 Abnormal levels of other serum enzymes: Secondary | ICD-10-CM | POA: Diagnosis not present

## 2023-02-09 ENCOUNTER — Other Ambulatory Visit (HOSPITAL_COMMUNITY): Payer: Self-pay | Admitting: Nurse Practitioner

## 2023-02-09 DIAGNOSIS — R768 Other specified abnormal immunological findings in serum: Secondary | ICD-10-CM

## 2023-02-09 DIAGNOSIS — R748 Abnormal levels of other serum enzymes: Secondary | ICD-10-CM

## 2023-02-12 ENCOUNTER — Ambulatory Visit (HOSPITAL_COMMUNITY): Payer: Medicare Other

## 2023-02-19 ENCOUNTER — Other Ambulatory Visit: Payer: Self-pay | Admitting: Student

## 2023-02-19 ENCOUNTER — Other Ambulatory Visit: Payer: Self-pay | Admitting: Internal Medicine

## 2023-02-19 DIAGNOSIS — R748 Abnormal levels of other serum enzymes: Secondary | ICD-10-CM

## 2023-02-20 ENCOUNTER — Ambulatory Visit (HOSPITAL_COMMUNITY)
Admission: RE | Admit: 2023-02-20 | Discharge: 2023-02-20 | Disposition: A | Discharge status: Home / Self Care | Payer: Medicare Other | Source: Ambulatory Visit | Attending: Nurse Practitioner | Admitting: Nurse Practitioner

## 2023-02-20 ENCOUNTER — Encounter (HOSPITAL_COMMUNITY): Payer: Self-pay

## 2023-02-20 ENCOUNTER — Other Ambulatory Visit: Payer: Self-pay

## 2023-02-20 DIAGNOSIS — R768 Other specified abnormal immunological findings in serum: Secondary | ICD-10-CM | POA: Diagnosis not present

## 2023-02-20 DIAGNOSIS — R945 Abnormal results of liver function studies: Secondary | ICD-10-CM | POA: Diagnosis not present

## 2023-02-20 DIAGNOSIS — R7989 Other specified abnormal findings of blood chemistry: Secondary | ICD-10-CM | POA: Diagnosis not present

## 2023-02-20 DIAGNOSIS — R748 Abnormal levels of other serum enzymes: Secondary | ICD-10-CM | POA: Insufficient documentation

## 2023-02-20 LAB — CBC
HCT: 41.5 % (ref 36.0–46.0)
Hemoglobin: 13.7 g/dL (ref 12.0–15.0)
MCH: 30.1 pg (ref 26.0–34.0)
MCHC: 33 g/dL (ref 30.0–36.0)
MCV: 91.2 fL (ref 80.0–100.0)
Platelets: 147 10*3/uL — ABNORMAL LOW (ref 150–400)
RBC: 4.55 MIL/uL (ref 3.87–5.11)
RDW: 14.4 % (ref 11.5–15.5)
WBC: 4.8 10*3/uL (ref 4.0–10.5)
nRBC: 0 % (ref 0.0–0.2)

## 2023-02-20 LAB — PROTIME-INR
INR: 1.1 (ref 0.8–1.2)
Prothrombin Time: 14.1 seconds (ref 11.4–15.2)

## 2023-02-20 MED ORDER — FENTANYL CITRATE (PF) 100 MCG/2ML IJ SOLN
INTRAMUSCULAR | Status: AC | PRN
  Administered 2023-02-20 (×2): 50 ug via INTRAVENOUS

## 2023-02-20 MED ORDER — MIDAZOLAM HCL 2 MG/2ML IJ SOLN
INTRAMUSCULAR | Status: AC | PRN
  Administered 2023-02-20 (×2): 1 mg via INTRAVENOUS

## 2023-02-20 MED ORDER — HYDROCODONE-ACETAMINOPHEN 5-325 MG PO TABS: 1.0000 | ORAL_TABLET | ORAL | Status: DC | PRN

## 2023-02-20 MED ORDER — FENTANYL CITRATE (PF) 100 MCG/2ML IJ SOLN
INTRAMUSCULAR | Status: AC
  Filled 2023-02-20: qty 2

## 2023-02-20 MED ORDER — LIDOCAINE HCL 1 % IJ SOLN
INTRAMUSCULAR | Status: AC
  Filled 2023-02-20: qty 20

## 2023-02-20 MED ORDER — MIDAZOLAM HCL 2 MG/2ML IJ SOLN
INTRAMUSCULAR | Status: AC
  Filled 2023-02-20: qty 2

## 2023-02-20 MED ORDER — GELATIN ABSORBABLE 12-7 MM EX MISC
CUTANEOUS | Status: AC
  Filled 2023-02-20: qty 1

## 2023-02-20 MED ORDER — SODIUM CHLORIDE 0.9 % IV SOLN: INTRAVENOUS | Status: DC

## 2023-02-20 NOTE — Procedures (Signed)
  Procedure:  [place] core liver biopsy   Preprocedure diagnosis: Diagnoses of Elevated liver enzymes, Iron overload, and Raised level of immunoglobulins were pertinent to this visit. Postprocedure diagnosis: same EBL:    minimal Complications:   none immediate  See full dictation in [employer].  Thora Lance MD Main # [phone] Pager  [phone] Mobile [phone]

## 2023-02-20 NOTE — H&P (Signed)
Chief Complaint: [name]  Referring Physician(s): Adrienne Lopez  Supervising Physician: Adrienne Lopez  Patient Status: Adrienne Lopez  History of Present Illness: Adrienne Lopez is a 72 y.o. female with PMH significant for breast cancer, hyperparathyroidism, and TIAs being seen today in relation to elevated liver enzymes. The patient is followed by Adrienne Major, NP from the [facility]. The patient has had persistent elevation of liver enzymes with elevated immunoglobulin and negative autoimmune testing to date. Patient was referred to [facility] for further evaluation.   Past Medical History:  Diagnosis Date   Cancer [facility]) 1999   breast, left   Hyperparathyroidism, primary ([facility])    Osteoporosis    TIA (transient ischemic attack) 12/26/2000   NO RESIDUAL PROBLEM    Past Surgical History:  Procedure Laterality Date   ABDOMINAL HYSTERECTOMY     BREAST LUMPECTOMY  1999   left   PARATHYROIDECTOMY N/A 05/06/2013   Procedure: PARATHYROIDECTOMY;  Surgeon: Adrienne Heckler, Lopez;  Location: [facility];  Service: General;  Laterality: N/A;   PARTIAL HYSTERECTOMY  1991    Allergies: Patient has no known allergies.  Medications: Prior to Admission medications   Medication Sig Start Date End Date Taking? Authorizing Provider  Ascorbic Acid (VITAMIN C) 1000 MG tablet Take 1,000 mg by mouth daily.    Provider, Historical, Lopez  aspirin 81 MG tablet Take 81 mg by mouth daily.    Provider, Historical, Lopez  atenolol-chlorthalidone (TENORETIC) 100-25 MG tablet Take 1/2 (one-half) tablet by mouth once daily 07/29/22   Adrienne Lopez  atorvastatin (LIPITOR) 20 MG tablet Take 1 tablet (20 mg total) by mouth daily. 06/11/22   Adrienne Felts, Adrienne Lopez  B Complex-C (B-COMPLEX WITH VITAMIN C) tablet Take 1 tablet by mouth daily. 100mg     Provider, Historical, Lopez  BIOTIN 5000 PO Take 1 tablet by mouth daily.      Provider, Historical, Lopez  cholecalciferol (VITAMIN D) 1000 UNITS tablet Take 5,000 Units by mouth daily.    Provider, Historical, Lopez  Cyanocobalamin (VITAMIN B-12 PO) Take 2,500 mg by mouth daily.    Provider, Historical, Lopez  famotidine (PEPCID) 20 MG tablet Take 20 mg by mouth as needed. 12/16/21   Provider, Historical, Lopez  Multiple Vitamin (MULTIVITAMIN) capsule Take 1 capsule by mouth daily.    Provider, Historical, Lopez  predniSONE (DELTASONE) 10 MG tablet Take 3 tablets (30 mg total) by mouth daily with breakfast. 12/05/22   Adrienne Bamberg, Adrienne Lopez     Family History  Problem Relation Age of Onset   Hypertension Mother    Prostate cancer Father    Heart attack Maternal Grandmother    Cancer Maternal Grandfather        oral cancer    Social History   Socioeconomic History   Marital status: Divorced    Spouse name: Not on file   Number of children: Not on file   Years of education: Not on file   Highest education level: Not on file  Occupational History   Not on file  Tobacco Use   Smoking status: Never   Smokeless tobacco: Never  Vaping Use   Vaping Use: Never used  Substance and Sexual Activity   Alcohol use: No   Drug use: No   Sexual activity: Not Currently  Other Topics Concern   Not on file  Social History Narrative   Not on file   Social Determinants of  Health   Financial Resource Strain: Low Risk  (06/11/2022)   Overall Financial Resource Strain (CARDIA)    Difficulty of Paying Living Expenses: Not hard at all  Food Insecurity: No Food Insecurity (06/11/2022)   Hunger Vital Sign    Worried About Running Out of Food in the Last Year: Never true    Ran Out of Food in the Last Year: Never true  Transportation Needs: No Transportation Needs (06/11/2022)   PRAPARE - Administrator, Civil Service (Medical): No    Lack of Transportation (Non-Medical): No  Physical Activity: Inactive (06/11/2022)   Exercise Vital Sign    Days of Exercise per Week: 0 days     Minutes of Exercise per Session: 0 min  Stress: No Stress Concern Present (06/11/2022)   [facility] [employer] - Occupational Stress Questionnaire    Feeling of Stress : Not at all  Social Connections: Socially Isolated (04/18/2020)   Social Connection and Isolation Panel [NHANES]    Frequency of Communication with Friends and Family: More than three times a week    Frequency of Social Gatherings with Friends and Family: Once a week    Attends Religious Services: Never    Database administrator or Organizations: No    Attends Engineer, structural: Never    Marital Status: Divorced    Code Status: Full code  Review of Systems: A 12 point ROS discussed and pertinent positives are indicated in the HPI above.  All other systems are negative.  Review of Systems  Constitutional:  Negative for chills and fever.  Respiratory:  Negative for chest tightness and shortness of breath.   Cardiovascular:  Negative for chest pain and leg swelling.  Gastrointestinal:  Negative for abdominal pain, diarrhea, nausea and vomiting.  Neurological:  Negative for dizziness and headaches.  Psychiatric/Behavioral:  Negative for confusion.     Vital Signs: BP (!) 159/98 (BP Location: Right Arm)   Pulse (!) 56   Temp 97.9 F (36.6 C) (Oral)   Resp 15   Ht 5\' 4"  (1.626 m)   Wt 135 lb (61.2 kg)   SpO2 98%   BMI 23.17 kg/m    Advance Care Plan: The advanced care plan/surrogate decision maker was discussed at the time of visit and documented in the medical record.  Patient identified her sister, [name], as her surrogate decision maker.  Physical Exam Vitals reviewed.  Constitutional:      General: She is not in acute distress.    Appearance: She is not ill-appearing.  Cardiovascular:     Rate and Rhythm: Normal rate and regular rhythm.     Pulses: Normal pulses.     Heart sounds: Normal heart sounds.  Pulmonary:     Effort: Pulmonary effort is normal.     Breath  sounds: Normal breath sounds.  Abdominal:     Palpations: Abdomen is soft.     Tenderness: There is no abdominal tenderness.  Musculoskeletal:     Right lower leg: No edema.     Left lower leg: No edema.  Skin:    General: Skin is warm and dry.  Neurological:     Mental Status: She is alert and oriented to person, place, and time.  Psychiatric:        Mood and Affect: Mood normal.        Behavior: Behavior normal.        Thought Content: Thought content normal.  Judgment: Judgment normal.     Imaging: No results found.  Labs:  CBC: Recent Labs    05/01/22 1045 02/20/23 1140  WBC 5.1 4.8  HGB 14.2 13.7  HCT 41.6 41.5  PLT 156 147*    COAGS: No results for input(s): "INR", "APTT" in the last 8760 hours.  BMP: Recent Labs    05/01/22 1045 06/11/22 1226 07/08/22 1128 10/29/22 1105  NA 139 142 138 141  K 3.6 4.4 3.6 3.9  CL 100 101 100 101  CO2 26 21 23 24   GLUCOSE 84 81 77 90  BUN 22 38* 21 28*  CALCIUM 9.6 10.2 9.7 9.8  CREATININE 0.98 1.09* 1.06* 1.00    LIVER FUNCTION TESTS: Recent Labs    05/01/22 1045 06/11/22 1226 10/29/22 1105 10/29/22 1122  BILITOT 0.4 0.5 0.5 0.5  AST 49* 49* 35 38  ALT 68* 35* 48* 47*  ALKPHOS 101 68 65 63  PROT 8.3 8.4 8.2 8.1  ALBUMIN 4.5 4.6 4.5 4.4    TUMOR MARKERS: No results for input(s): "AFPTM", "CEA", "CA199", "CHROMGRNA" in the last 8760 hours.  Assessment and Plan:  [place] Behlke is a 72 yo female being seen today in relation to elevated liver enzymes. The patient has had persistent elevation in her liver enzymes and has been referred to [facility] for image-guided liver biopsy to further evaluate her. Patient presents today in her usual state of health and is NPO. Case has been reviewed with Dr Deanne Coffer and is scheduled to proceed on 02/20/23. Pre-procedural labs are pending.  Risks and benefits of image-guided liver biopsy was discussed with the patient and/or patient's family including, but  not limited to bleeding, infection, damage to adjacent structures or low yield requiring additional tests.  All of the questions were answered and there is agreement to proceed.  Consent signed and in chart.    Thank you for this interesting consult.  I greatly enjoyed meeting Adrienne Lopez and look forward to participating in their care.  A copy of this report was sent to the requesting provider on this date.  Electronically Signed: Kennieth Francois, Adrienne Lopez 02/20/2023, 11:52 AM   I spent a total of  30 Minutes   in face to face in clinical consultation, greater than 50% of which was counseling/coordinating care for elevated liver enzymes.

## 2023-02-20 NOTE — Discharge Instructions (Signed)
Please call Interventional Radiology clinic [phone] with any questions or concerns.   You may remove your dressing and shower tomorrow.   Liver Biopsy, Care After After a liver biopsy, it is common to have these things in the area where the biopsy was done. You may: Have pain. Feel sore. Have bruising. You may also feel tired for a few days. Follow these instructions at home: Medicines Take over-the-counter and prescription medicines only as told by your doctor. If you were prescribed an antibiotic medicine, take it as told by your doctor. Do not stop taking the antibiotic, even if you start to feel better. Do not take medicines that may thin your blood. These medicines include aspirin and ibuprofen. Take them only if your doctor tells you to. If told, take steps to prevent problems with pooping (constipation). You may need to: Drink enough fluid to keep your pee (urine) pale yellow. Take medicines. You will be told what medicines to take. Eat foods that are high in fiber. These include beans, whole grains, and fresh fruits and vegetables. Limit foods that are high in fat and sugar. These include fried or sweet foods. Ask your doctor if you should avoid driving or using machines while you are taking your medicine. Caring for your incision Follow instructions from your doctor about how to take care of your cut from surgery (incisions). Make sure you: Wash your hands with soap and water for at least 20 seconds before and after you change your bandage. If you cannot use soap and water, use hand sanitizer. Change your bandage. Leavestitches or skin glue in place for at least two weeks. Leave tape strips alone unless you are told to take them off. You may trim the edges of the tape strips if they curl up. Check your incision every day for signs of infection. Check for: Redness, swelling, or more pain. Fluid or blood. Warmth. Pus or a bad smell. Do not take baths, swim, or use a  hot tub. Ask your doctor about taking showers or sponge baths. Activity Rest at home for 1-2 days, or as told by your doctor. Get up to take short walks every 1 to 2 hours. Ask for help if you feel weak or unsteady. Do not lift anything that is heavier than 10 lb (4.5 kg), or the limit that you are told. Do not play contact sports for 2 weeks after the procedure. Return to your normal activities as told by your doctor. Ask what activities are safe for you. General instructions  Do not drink alcohol in the first week after the procedure. Plan to have a responsible adult care for you for the time you are told after you leave the hospital or clinic. This is important. It is up to you to get the results of your procedure. Ask how to get your results when they are ready. Keep all follow-up visits.   Contact a doctor if: You have more bleeding in your incision. Your incision swells, or is red and more painful. You have fluid that comes from your incision. You develop a rash. You have fever or chills. Get help right away if: You have swelling, bloating, or pain in your belly (abdomen). You get dizzy or faint. You vomit or you feel like vomiting. You have trouble breathing or feel short of breath. You have chest pain. You have problems talking or seeing. You have trouble with your balance or moving your arms or legs. These symptoms may be an emergency. Get help   right away. Call your local emergency services (911 in the [place] Do not wait to see if the symptoms will go away. Do not drive yourself to the hospital. Summary After the procedure, it is common to have pain, soreness, bruising, and tiredness. Your doctor will tell you how to take care of yourself at home. Change your bandage, take your medicines, and limit your activities as told by your doctor. Call your doctor if you have symptoms of infection. Get help right away if your belly swells, your cut bleeds a lot, or you have trouble  talking or breathing. This information is not intended to replace advice given to you by your health care provider. Make sure you discuss any questions you have with your healthcare provider. Document Revised: 07/02/2020 Document Reviewed: 07/02/2020 Elsevier Patient Education  2022 [employer] Inc[employer]     Moderate Conscious Sedation, Adult, Care After This sheet gives you information about how to care for yourself after your procedure. Your health care provider may also give you more specific instructions. If you have problems or questions, contact your health care provider. What can I expect after the procedure? After the procedure, it is common to have: Sleepiness for several hours. Impaired judgment for several hours. Difficulty with balance. Vomiting if you eat too soon. Follow these instructions at home: For the time period you were told by your health care provider: Rest. Do not participate in activities where you could fall or become injured. Do not drive or use machinery. Do not drink alcohol. Do not take sleeping pills or medicines that cause drowsiness. Do not make important decisions or sign legal documents. Do not take care of children on your own.        Eating and drinking Follow the diet recommended by your health care provider. Drink enough fluid to keep your urine pale yellow. If you vomit: Drink water, juice, or soup when you can drink without vomiting. Make sure you have little or no nausea before eating solid foods.    General instructions Take over-the-counter and prescription medicines only as told by your health care provider. Have a responsible adult stay with you for the time you are told. It is important to have someone help care for you until you are awake and alert. Do not smoke. Keep all follow-up visits as told by your health care provider. This is important. Contact a health care provider if: You are still sleepy or having trouble with balance after  24 hours. You feel light-headed. You keep feeling nauseous or you keep vomiting. You develop a rash. You have a fever. You have redness or swelling around the IV site. Get help right away if: You have trouble breathing. You have new-onset confusion at home. Summary After the procedure, it is common to feel sleepy, have impaired judgment, or feel nauseous if you eat too soon. Rest after you get home. Know the things you should not do after the procedure. Follow the diet recommended by your health care provider and drink enough fluid to keep your urine pale yellow. Get help right away if you have trouble breathing or new-onset confusion at home. This information is not intended to replace advice given to you by your health care provider. Make sure you discuss any questions you have with your health care provider. Document Revised: 12/16/2019 Document Reviewed: 07/14/2019 Elsevier Patient Education  2021 Elsevier Inc.    

## 2023-02-25 LAB — SURGICAL PATHOLOGY

## 2023-02-26 ENCOUNTER — Ambulatory Visit: Payer: Medicare Other | Admitting: Nurse Practitioner

## 2023-02-26 ENCOUNTER — Encounter: Payer: Self-pay | Admitting: Nurse Practitioner

## 2023-02-26 VITALS — BP 142/80 | HR 56 | Temp 98.4°F | Ht 63.6 in | Wt 134.0 lb

## 2023-02-26 DIAGNOSIS — E782 Mixed hyperlipidemia: Secondary | ICD-10-CM

## 2023-02-26 DIAGNOSIS — I10 Essential (primary) hypertension: Secondary | ICD-10-CM | POA: Diagnosis not present

## 2023-02-26 DIAGNOSIS — R7309 Other abnormal glucose: Secondary | ICD-10-CM | POA: Diagnosis not present

## 2023-02-26 NOTE — Assessment & Plan Note (Signed)
No current medications due to elevated liver functions, will recheck cholesterol levels

## 2023-02-26 NOTE — Assessment & Plan Note (Signed)
Blood pressure elevated, repeat is

## 2023-02-26 NOTE — Patient Instructions (Signed)
Hypertension, Adult Hypertension is another name for high blood pressure. High blood pressure forces your heart to work harder to pump blood. This can cause problems over time. There are two numbers in a blood pressure reading. There is a top number (systolic) over a bottom number (diastolic). It is best to have a blood pressure that is below 120/80. What are the causes? The cause of this condition is not known. Some other conditions can lead to high blood pressure. What increases the risk? Some lifestyle factors can make you more likely to develop high blood pressure: Smoking. Not getting enough exercise or physical activity. Being overweight. Having too much fat, sugar, calories, or salt (sodium) in your diet. Drinking too much alcohol. Other risk factors include: Having any of these conditions: Heart disease. Diabetes. High cholesterol. Kidney disease. Obstructive sleep apnea. Having a family history of high blood pressure and high cholesterol. Age. The risk increases with age. Stress. What are the signs or symptoms? High blood pressure may not cause symptoms. Very high blood pressure (hypertensive crisis) may cause: Headache. Fast or uneven heartbeats (palpitations). Shortness of breath. Nosebleed. Vomiting or feeling like you may vomit (nauseous). Changes in how you see. Very bad chest pain. Feeling dizzy. Seizures. How is this treated? This condition is treated by making healthy lifestyle changes, such as: Eating healthy foods. Exercising more. Drinking less alcohol. Your doctor may prescribe medicine if lifestyle changes do not help enough and if: Your top number is above 130. Your bottom number is above 80. Your personal target blood pressure may vary. Follow these instructions at home: Eating and drinking  If told, follow the DASH eating plan. To follow this plan: Fill one half of your plate at each meal with fruits and vegetables. Fill one fourth of your plate  at each meal with whole grains. Whole grains include whole-wheat pasta, brown rice, and whole-grain bread. Eat or drink low-fat dairy products, such as skim milk or low-fat yogurt. Fill one fourth of your plate at each meal with low-fat (lean) proteins. Low-fat proteins include fish, chicken without skin, eggs, beans, and tofu. Avoid fatty meat, cured and processed meat, or chicken with skin. Avoid pre-made or processed food. Limit the amount of salt in your diet to less than 1,500 mg each day. Do not drink alcohol if: Your doctor tells you not to drink. You are pregnant, may be pregnant, or are planning to become pregnant. If you drink alcohol: Limit how much you have to: 0-1 drink a day for women. 0-2 drinks a day for men. Know how much alcohol is in your drink. In the [place] one drink equals one 12 oz bottle of beer (355 mL), one 5 oz glass of wine (148 mL), or one 1 oz glass of hard liquor (44 mL). Lifestyle  Work with your doctor to stay at a healthy weight or to lose weight. Ask your doctor what the best weight is for you. Get at least 30 minutes of exercise that causes your heart to beat faster (aerobic exercise) most days of the week. This may include walking, swimming, or biking. Get at least 30 minutes of exercise that strengthens your muscles (resistance exercise) at least 3 days a week. This may include lifting weights or doing Pilates. Do not smoke or use any products that contain nicotine or tobacco. If you need help quitting, ask your doctor. Check your blood pressure at home as told by your doctor. Keep all follow-up visits. Medicines Take over-the-counter and prescription medicines   only as told by your doctor. Follow directions carefully. Do not skip doses of blood pressure medicine. The medicine does not work as well if you skip doses. Skipping doses also puts you at risk for problems. Ask your doctor about side effects or reactions to medicines that you should watch  for. Contact a doctor if: You think you are having a reaction to the medicine you are taking. You have headaches that keep coming back. You feel dizzy. You have swelling in your ankles. You have trouble with your vision. Get help right away if: You get a very bad headache. You start to feel mixed up (confused). You feel weak or numb. You feel faint. You have very bad pain in your: Chest. Belly (abdomen). You vomit more than once. You have trouble breathing. These symptoms may be an emergency. Get help right away. Call 911. Do not wait to see if the symptoms will go away. Do not drive yourself to the hospital. Summary Hypertension is another name for high blood pressure. High blood pressure forces your heart to work harder to pump blood. For most people, a normal blood pressure is less than 120/80. Making healthy choices can help lower blood pressure. If your blood pressure does not get lower with healthy choices, you may need to take medicine. This information is not intended to replace advice given to you by your health care provider. Make sure you discuss any questions you have with your health care provider. Document Revised: 06/06/2021 Document Reviewed: 06/06/2021 Elsevier Patient Education  2024 Elsevier Inc.  

## 2023-02-26 NOTE — Assessment & Plan Note (Signed)
HgbA1c is stable, continue current medications

## 2023-02-26 NOTE — Progress Notes (Signed)
I,Jameka J Llittleton, CMA,acting as a Neurosurgeon for [employer], FNP.,have documented all relevant documentation on the behalf of Arnette Felts, FNP,as directed by  Arnette Felts, FNP while in the presence of Arnette Felts, FNP.  Subjective:  Patient ID: Adrienne Lopez , female    DOB: 08/26/1951 , 72 y.o.   MRN: [id number]  Chief Complaint  Patient presents with   Hypertension    HPI  Patient presents today for htn follow up. Patient has no other complaints or concerns. Patient does not have any questions or concerns at this time. She has not taken her blood pressure medications today  Hypertension This is a chronic problem. The current episode started more than 1 year ago. The problem is unchanged. The problem is controlled. Pertinent negatives include no anxiety. There are no associated agents to hypertension. Risk factors for coronary artery disease include sedentary lifestyle. Past treatments include beta blockers. There are no compliance problems.  There is no history of angina. There is no history of chronic renal disease.     Past Medical History:  Diagnosis Date   Cancer [facility]) 1999   breast, left   Hyperparathyroidism, primary (HCC)    Osteoporosis    TIA (transient ischemic attack) 12/26/2000   NO RESIDUAL PROBLEM     Family History  Problem Relation Age of Onset   Hypertension Mother    Prostate cancer Father    Heart attack Maternal Grandmother    Cancer Maternal Grandfather        oral cancer     Current Outpatient Medications:    Ascorbic Acid (VITAMIN C) 1000 MG tablet, Take 1,000 mg by mouth daily., Disp: , Rfl:    aspirin 81 MG tablet, Take 81 mg by mouth daily., Disp: , Rfl:    atenolol-chlorthalidone (TENORETIC) 100-25 MG tablet, Take 1/2 (one-half) tablet by mouth once daily, Disp: 45 tablet, Rfl: 3   B Complex-C (B-COMPLEX WITH VITAMIN C) tablet, Take 1 tablet by mouth daily. 100mg , Disp: , Rfl:    BIOTIN 5000 PO, Take 1 tablet by mouth daily. , Disp: , Rfl:     cholecalciferol (VITAMIN D) 1000 UNITS tablet, Take 5,000 Units by mouth daily., Disp: , Rfl:    Cyanocobalamin (VITAMIN B-12 PO), Take 2,500 mg by mouth daily., Disp: , Rfl:    Multiple Vitamin (MULTIVITAMIN) capsule, Take 1 capsule by mouth daily., Disp: , Rfl:    No Known Allergies   Review of Systems  Constitutional: Negative.   Respiratory: Negative.    Cardiovascular: Negative.   Gastrointestinal: Negative.   Musculoskeletal: Negative.   Neurological: Negative.   Psychiatric/Behavioral: Negative.       Today's Vitals   02/26/23 1007 02/26/23 1100  BP: (!) 160/90 (!) 142/80  Pulse: (!) 56   Temp: 98.4 F (36.9 C)   Weight: 134 lb (60.8 kg)   Height: 5' 3.6" (1.615 m)   PainSc: 0-No pain    Body mass index is 23.29 kg/m.  Wt Readings from Last 3 Encounters:  02/26/23 134 lb (60.8 kg)  02/20/23 135 lb (61.2 kg)  10/29/22 138 lb (62.6 kg)    The 10-year ASCVD risk score (Arnett DK, et al., 2019) is: 18.9%   Values used to calculate the score:     Age: 42 years     Sex: Female     Is Non-Hispanic African American: Yes     Diabetic: No     Tobacco smoker: No     Systolic Blood Pressure: 142 mmHg  Is BP treated: Yes     HDL Cholesterol: 70 mg/dL     Total Cholesterol: 221 mg/dL  Objective:  Physical Exam Vitals reviewed.  Constitutional:      General: She is not in acute distress.    Appearance: Normal appearance.  Cardiovascular:     Rate and Rhythm: Normal rate and regular rhythm.     Pulses: Normal pulses.     Heart sounds: Normal heart sounds. No murmur heard. Pulmonary:     Effort: Pulmonary effort is normal. No respiratory distress.     Breath sounds: Normal breath sounds. No wheezing.  Skin:    General: Skin is warm and dry.     Capillary Refill: Capillary refill takes less than 2 seconds.  Neurological:     General: No focal deficit present.     Mental Status: She is alert and oriented to person, place, and time.     Cranial Nerves: No cranial  nerve deficit.     Motor: No weakness.  Psychiatric:        Mood and Affect: Mood normal.        Behavior: Behavior normal.        Thought Content: Thought content normal.        Judgment: Judgment normal.         Assessment And Plan:  1. Essential hypertension Comments: Blood pressure is elevated, repeat is improved however advised to take her blood pressure medications when she gets home - [id number]  2. Mixed hyperlipidemia Comments: Cholesterol levels were controlled, continue focusing on healthy diet low in processed foods and statin - Lipid panel  3. Abnormal glucose Comments: HgbA1c is stable. Continue healthy diet - Hemoglobin A1c    Return for keep next appt as scheduled; cancel October appt with me.   Patient was given opportunity to ask questions. Patient verbalized understanding of the plan and was able to repeat key elements of the plan. All questions were answered to their satisfaction.  Arnette Felts, FNP  I, Arnette Felts, FNP, have reviewed all documentation for this visit. The documentation on 02/26/23 for the exam, diagnosis, procedures, and orders are all accurate and complete.   IF YOU HAVE BEEN REFERRED TO A SPECIALIST, IT MAY TAKE 1-2 WEEKS TO SCHEDULE/PROCESS THE REFERRAL. IF YOU HAVE NOT HEARD FROM US/SPECIALIST IN TWO WEEKS, PLEASE GIVE [place] A CALL AT [phone] X [phone].

## 2023-02-27 LAB — CMP14+EGFR
ALT: 29 IU/L (ref 0–32)
AST: 26 IU/L (ref 0–40)
Albumin: 4.4 g/dL (ref 3.8–4.8)
Alkaline Phosphatase: 57 IU/L (ref 44–121)
BUN/Creatinine Ratio: 31 — ABNORMAL HIGH (ref 12–28)
BUN: 29 mg/dL — ABNORMAL HIGH (ref 8–27)
Bilirubin Total: 0.4 mg/dL (ref 0.0–1.2)
CO2: 26 mmol/L (ref 20–29)
Calcium: 9.7 mg/dL (ref 8.7–10.3)
Chloride: 99 mmol/L (ref 96–106)
Creatinine, Ser: 0.93 mg/dL (ref 0.57–1.00)
Globulin, Total: 3.7 g/dL (ref 1.5–4.5)
Glucose: 82 mg/dL (ref 70–99)
Potassium: 3.7 mmol/L (ref 3.5–5.2)
Sodium: 141 mmol/L (ref 134–144)
Total Protein: 8.1 g/dL (ref 6.0–8.5)
eGFR: 65 mL/min/{1.73_m2} (ref >59)

## 2023-02-27 LAB — LIPID PANEL
Chol/HDL Ratio: 3.2 ratio (ref 0.0–4.4)
Cholesterol, Total: 221 mg/dL — ABNORMAL HIGH (ref 100–199)
HDL: 70 mg/dL (ref >39)
LDL Chol Calc (NIH): 134 mg/dL — ABNORMAL HIGH (ref 0–99)
Triglycerides: 95 mg/dL (ref 0–149)
VLDL Cholesterol Cal: 17 mg/dL (ref 5–40)

## 2023-02-27 LAB — HEMOGLOBIN A1C
Est. average glucose Bld gHb Est-mCnc: 123 mg/dL
Hgb A1c MFr Bld: 5.9 % — ABNORMAL HIGH (ref 4.8–5.6)

## 2023-03-13 DIAGNOSIS — K739 Chronic hepatitis, unspecified: Secondary | ICD-10-CM | POA: Diagnosis not present

## 2023-03-17 ENCOUNTER — Other Ambulatory Visit: Payer: Self-pay

## 2023-03-17 ENCOUNTER — Encounter: Payer: Self-pay | Admitting: Nurse Practitioner

## 2023-03-17 MED ORDER — ROSUVASTATIN CALCIUM 5 MG PO TABS: 5.0000 mg | ORAL_TABLET | Freq: Every day | ORAL | 1 refills | Status: DC

## 2023-03-18 ENCOUNTER — Encounter: Payer: Self-pay | Admitting: Nurse Practitioner

## 2023-04-21 ENCOUNTER — Other Ambulatory Visit: Payer: Self-pay

## 2023-04-21 MED ORDER — ROSUVASTATIN CALCIUM 5 MG PO TABS: 5.0000 mg | ORAL_TABLET | Freq: Every day | ORAL | 2 refills | Status: DC

## 2023-04-24 ENCOUNTER — Other Ambulatory Visit: Payer: Self-pay

## 2023-04-24 MED ORDER — ROSUVASTATIN CALCIUM 5 MG PO TABS: 5.0000 mg | ORAL_TABLET | Freq: Every day | ORAL | 0 refills | Status: DC

## 2023-04-28 ENCOUNTER — Other Ambulatory Visit: Payer: Self-pay | Admitting: Nurse Practitioner

## 2023-04-28 ENCOUNTER — Other Ambulatory Visit: Payer: Medicare Other

## 2023-04-28 DIAGNOSIS — E782 Mixed hyperlipidemia: Secondary | ICD-10-CM

## 2023-04-29 LAB — HEPATIC FUNCTION PANEL
ALT: 40 IU/L — ABNORMAL HIGH (ref 0–32)
AST: 42 IU/L — ABNORMAL HIGH (ref 0–40)
Albumin: 4.3 g/dL (ref 3.8–4.8)
Alkaline Phosphatase: 62 IU/L (ref 44–121)
Bilirubin Total: 0.5 mg/dL (ref 0.0–1.2)
Bilirubin, Direct: 0.14 mg/dL (ref 0.00–0.40)
Total Protein: 8 g/dL (ref 6.0–8.5)

## 2023-05-05 ENCOUNTER — Encounter: Payer: Self-pay | Admitting: Pharmacist

## 2023-05-06 NOTE — Progress Notes (Signed)
Madelaine Bhat, CMA,acting as a Neurosurgeon for Arnette Felts, FNP.,have documented all relevant documentation on the behalf of Arnette Felts, FNP,as directed by  Arnette Felts, FNP while in the presence of Arnette Felts, FNP.  Subjective:    Patient ID: Adrienne Lopez , female    DOB: 05-30-1951 , 72 y.o.   MRN: [id number]  Chief Complaint  Patient presents with   Annual Exam    HPI  Patient presents today for HM, Patient reports compliance with medications. Patient denies any Chest pain, SOB, and headaches. Patient has no concerns today. Patient reports she has her colonoscopy scheduled. Patient does not want her EKG done today, patient reports she sees cardiologist next month and they will do her EKG. 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  She is to also cut back on iron rich foods, avoid iron containing foods  BP Readings from Last 3 Encounters: 05/07/23 : 120/60 02/26/23 : (!) 142/80 02/20/23 : 126/75       Past Medical History:  Diagnosis Date   Cancer (HCC) 1999   breast, left   Hyperparathyroidism, primary (HCC)    Osteoporosis    TIA (transient ischemic attack) 12/26/2000   NO RESIDUAL PROBLEM     Family History  Problem Relation Age of Onset   Hypertension Mother    Prostate cancer Father    Diabetes Father    Heart attack Maternal Grandmother    Heart disease Maternal Grandmother    Cancer Maternal Grandfather        oral cancer     Current Outpatient Medications:    Ascorbic Acid (VITAMIN C) 1000 MG tablet, Take 1,000 mg by mouth daily., Disp: , Rfl:    aspirin 81 MG tablet, Take 81 mg by mouth daily., Disp: , Rfl:    atenolol-chlorthalidone (TENORETIC) 100-25 MG tablet, Take 1/2 (one-half) tablet by mouth once daily, Disp: 45 tablet, Rfl: 3   B Complex-C (B-COMPLEX WITH VITAMIN C) tablet, Take 1 tablet by mouth daily. 100mg , Disp: , Rfl:    Bempedoic Acid  (NEXLETOL) 180 MG TABS, Take 1 tablet (180 mg total) by mouth daily., Disp: 90 tablet, Rfl: 1   BIOTIN 5000 PO, Take 1 tablet by mouth daily. , Disp: , Rfl:    cholecalciferol (VITAMIN D) 1000 UNITS tablet, Take 5,000 Units by mouth daily., Disp: , Rfl:    Cyanocobalamin (VITAMIN B-12 PO), Take 2,500 mg by mouth daily., Disp: , Rfl:    Multiple Vitamin (MULTIVITAMIN) capsule, Take 1 capsule by mouth daily., Disp: , Rfl:    rosuvastatin (CRESTOR) 5 MG tablet, Take 1 tablet (5 mg total) by mouth daily., Disp: 90 tablet, Rfl: 0   No Known Allergies    The patient states she uses status post hysterectomy No LMP recorded. Patient has had a hysterectomy. Negative for: breast discharge, breast lump(s), breast pain and breast self exam. Associated symptoms include abnormal vaginal bleeding. Pertinent negatives include abnormal bleeding (hematology), anxiety, decreased libido, depression, difficulty falling sleep, dyspareunia, history of infertility, nocturia, sexual dysfunction, sleep disturbances, urinary incontinence, urinary urgency, vaginal discharge and vaginal itching. Diet regular; healthy; she has cut back on Spinach due to the high iron content. She eats Quinoa, baked foods chicken and salmon, fruit, blueberries and strawberries, egg white. She does not eat junk food or processed foods. The patient states her exercise level is     The patient's  tobacco use is:  Social History   Tobacco Use  Smoking Status Never  Smokeless Tobacco Never  . She has been exposed to passive smoke. The patient's alcohol use is:  Social History   Substance and Sexual Activity  Alcohol Use Never    Review of Systems  Constitutional: Negative.   HENT: Negative.    Eyes: Negative.   Respiratory: Negative.    Cardiovascular: Negative.   Gastrointestinal: Negative.   Endocrine: Negative.   Genitourinary: Negative.   Musculoskeletal: Negative.   Skin: Negative.   Allergic/Immunologic: Negative.    Neurological: Negative.   Hematological: Negative.   Psychiatric/Behavioral: Negative.       Today's Vitals   05/07/23 1003  BP: 120/60  Pulse: 85  Temp: 98.4 F (36.9 C)  TempSrc: Oral  Weight: 136 lb 12.8 oz (62.1 kg)  Height: 5\' 3"  (1.6 m)  PainSc: 0-No pain   Body mass index is 24.23 kg/m.  Wt Readings from Last 3 Encounters:  05/07/23 136 lb 12.8 oz (62.1 kg)  02/26/23 134 lb (60.8 kg)  02/20/23 135 lb (61.2 kg)     Objective:  Physical Exam      Assessment And Plan:     Encounter for annual health examination Assessment & Plan: Behavior modifications discussed and diet history reviewed.   Pt will continue to exercise regularly and modify diet with low GI, plant based foods and decrease intake of processed foods.  Recommend intake of daily multivitamin, Vitamin D, and calcium.  Recommend mammogram and colonoscopy for preventive screenings, as well as recommend immunizations that include influenza, TDAP, and Shingles    Mixed hyperlipidemia Assessment & Plan: Cholesterol levels are slightly back up. Will start her on nexlotol  Orders: -     Nexletol; Take 1 tablet (180 mg total) by mouth daily.  Dispense: 90 tablet; Refill: 1  Essential hypertension Assessment & Plan: Blood pressure is well controlled. Continue current medications  Orders: -     POCT URINALYSIS DIP (CLINITEK) -     Microalbumin / creatinine urine ratio  Abnormal glucose Assessment & Plan: HgbA1c is stable.    Influenza vaccination declined Assessment & Plan: Patient declined influenza vaccination at this time. Patient is aware that influenza vaccine prevents illness in 70% of healthy people, and reduces hospitalizations to 30-70% in elderly. This vaccine is recommended annually. Education has been provided regarding the importance of this vaccine but patient still declined. Advised may receive this vaccine at local pharmacy or Health Dept.or vaccine clinic. Aware to provide a copy of  the vaccination record if obtained from local pharmacy or Health Dept.  Pt is willing to accept risk associated with refusing vaccination.    Elevated liver enzymes Assessment & Plan: Liver functions are back elevated after starting statin. Will try her on nexletol due to the intolerance   Statin intolerance Assessment & Plan: Liver functions increased with statins.   Orders: -     Nexletol; Take 1 tablet (180 mg total) by mouth daily.  Dispense: 90 tablet; Refill: 1  History of TIA (transient ischemic attack) -     Nexletol; Take 1 tablet (180 mg total) by mouth daily.  Dispense: 90 tablet; Refill: 1     Return for 1 year physical, add her to my schedule on 10/24 on day of her AWV for medication f/u. Patient was given opportunity to ask questions. Patient verbalized understanding of the plan and was able to repeat key elements of the plan. All questions were answered  to their satisfaction.   Arnette Felts, FNP  I, Arnette Felts, FNP, have reviewed all documentation for this visit. The documentation on 05/07/23 for the exam, diagnosis, procedures, and orders are all accurate and complete.

## 2023-05-07 ENCOUNTER — Ambulatory Visit (INDEPENDENT_AMBULATORY_CARE_PROVIDER_SITE_OTHER): Payer: Medicare Other | Admitting: Nurse Practitioner

## 2023-05-07 ENCOUNTER — Encounter: Payer: Self-pay | Admitting: Nurse Practitioner

## 2023-05-07 VITALS — BP 120/60 | HR 85 | Temp 98.4°F | Ht 63.0 in | Wt 136.8 lb

## 2023-05-07 DIAGNOSIS — Z789 Other specified health status: Secondary | ICD-10-CM

## 2023-05-07 DIAGNOSIS — Z0000 Encounter for general adult medical examination without abnormal findings: Secondary | ICD-10-CM

## 2023-05-07 DIAGNOSIS — R748 Abnormal levels of other serum enzymes: Secondary | ICD-10-CM

## 2023-05-07 DIAGNOSIS — R7309 Other abnormal glucose: Secondary | ICD-10-CM | POA: Diagnosis not present

## 2023-05-07 DIAGNOSIS — Z8673 Personal history of transient ischemic attack (TIA), and cerebral infarction without residual deficits: Secondary | ICD-10-CM | POA: Diagnosis not present

## 2023-05-07 DIAGNOSIS — E782 Mixed hyperlipidemia: Secondary | ICD-10-CM | POA: Diagnosis not present

## 2023-05-07 DIAGNOSIS — I10 Essential (primary) hypertension: Secondary | ICD-10-CM

## 2023-05-07 DIAGNOSIS — Z2821 Immunization not carried out because of patient refusal: Secondary | ICD-10-CM

## 2023-05-07 LAB — POCT URINALYSIS DIP (CLINITEK)
Bilirubin, UA: NEGATIVE
Blood, UA: NEGATIVE
Glucose, UA: NEGATIVE mg/dL
Ketones, POC UA: NEGATIVE mg/dL
Leukocytes, UA: NEGATIVE
Nitrite, UA: NEGATIVE
POC PROTEIN,UA: NEGATIVE
Spec Grav, UA: 1.015 (ref 1.010–1.025)
Urobilinogen, UA: 0.2 U/dL
pH, UA: 7 (ref 5.0–8.0)

## 2023-05-07 MED ORDER — NEXLETOL 180 MG PO TABS
1.0000 | ORAL_TABLET | Freq: Every day | ORAL | 1 refills | Status: DC
Start: 2023-05-07 — End: 2023-08-05

## 2023-05-07 NOTE — Patient Instructions (Signed)
Health Maintenance  Topic Date Due   Colon Cancer Screening  04/20/2023   COVID-19 Vaccine (6 - 2023-24 season) 05/03/2023   Medicare Annual Wellness Visit  06/12/2023   Flu Shot  11/30/2023*   Mammogram  09/24/2023   DTaP/Tdap/Td vaccine (2 - Td or Tdap) 05/18/2030   Pneumonia Vaccine  Completed   DEXA scan (bone density measurement)  Completed   Hepatitis C Screening  Completed   Zoster (Shingles) Vaccine  Completed   HPV Vaccine  Aged Out  *Topic was postponed. The date shown is not the original due date.

## 2023-05-18 ENCOUNTER — Encounter: Payer: Self-pay | Admitting: Nurse Practitioner

## 2023-05-24 DIAGNOSIS — Z2821 Immunization not carried out because of patient refusal: Secondary | ICD-10-CM | POA: Insufficient documentation

## 2023-05-24 DIAGNOSIS — Z789 Other specified health status: Secondary | ICD-10-CM | POA: Insufficient documentation

## 2023-05-24 DIAGNOSIS — Z0000 Encounter for general adult medical examination without abnormal findings: Secondary | ICD-10-CM | POA: Insufficient documentation

## 2023-05-24 NOTE — Assessment & Plan Note (Signed)

## 2023-05-24 NOTE — Assessment & Plan Note (Signed)
Liver functions are back elevated after starting statin. Will try her on nexletol due to the intolerance

## 2023-05-24 NOTE — Assessment & Plan Note (Signed)
Blood pressure is well controlled. Continue current medications.

## 2023-05-24 NOTE — Assessment & Plan Note (Signed)
Cholesterol levels are slightly back up. Will start her on nexlotol

## 2023-05-24 NOTE — Assessment & Plan Note (Signed)
Liver functions increased with statins.

## 2023-05-24 NOTE — Assessment & Plan Note (Signed)
HgbA1c is stable.

## 2023-05-24 NOTE — Assessment & Plan Note (Signed)
Behavior modifications discussed and diet history reviewed.   Pt will continue to exercise regularly and modify diet with low GI, plant based foods and decrease intake of processed foods.  Recommend intake of daily multivitamin, Vitamin D, and calcium.  Recommend mammogram and colonoscopy for preventive screenings, as well as recommend immunizations that include influenza, TDAP, and Shingles  

## 2023-06-01 ENCOUNTER — Encounter: Payer: Self-pay | Admitting: Nurse Practitioner

## 2023-06-25 ENCOUNTER — Encounter: Payer: Self-pay | Admitting: Nurse Practitioner

## 2023-06-25 ENCOUNTER — Ambulatory Visit: Payer: Medicare Other | Admitting: Nurse Practitioner

## 2023-06-25 ENCOUNTER — Ambulatory Visit: Payer: Self-pay | Admitting: Nurse Practitioner

## 2023-06-25 ENCOUNTER — Ambulatory Visit: Payer: Medicare Other

## 2023-06-25 VITALS — BP 124/70 | HR 57 | Temp 98.2°F | Ht 63.0 in | Wt 134.0 lb

## 2023-06-25 VITALS — BP 124/70 | HR 57 | Temp 98.2°F | Ht 63.8 in | Wt 134.6 lb

## 2023-06-25 DIAGNOSIS — Z0000 Encounter for general adult medical examination without abnormal findings: Secondary | ICD-10-CM

## 2023-06-25 DIAGNOSIS — M25572 Pain in left ankle and joints of left foot: Secondary | ICD-10-CM | POA: Diagnosis not present

## 2023-06-25 DIAGNOSIS — Z79899 Other long term (current) drug therapy: Secondary | ICD-10-CM | POA: Diagnosis not present

## 2023-06-25 DIAGNOSIS — R7309 Other abnormal glucose: Secondary | ICD-10-CM

## 2023-06-25 DIAGNOSIS — E782 Mixed hyperlipidemia: Secondary | ICD-10-CM | POA: Diagnosis not present

## 2023-06-25 NOTE — Progress Notes (Signed)
Madelaine Bhat, CMA,acting as a Neurosurgeon for Arnette Felts, FNP.,have documented all relevant documentation on the behalf of Arnette Felts, FNP,as directed by  Arnette Felts, FNP while in the presence of Arnette Felts, FNP.  Subjective:  Patient ID: Adrienne Lopez , female    DOB: 1951-04-24 , 72 y.o.   MRN: [id number]  Chief Complaint  Patient presents with   Hyperlipidemia    HPI  Patient presents today for a chol follow up, Patient reports compliance with medication. Patient denies any chest pain, SOB, or headaches. Patient has no concerns today. Patient recently started Nexetol. She also had her medicare awv. She had an intense pain that shot throught hre ankle on left ankle. She is having to be careful when ambulating. She had seen Dr. Magnus Ivan orthopedics. She had a tightness in her leg to her calf. She had some stiffness to her calf which is no  longer there.   BP Readings from Last 3 Encounters: 06/25/23 : 124/70 06/25/23 : 124/70 05/07/23 : 120/60       Past Medical History:  Diagnosis Date   Cancer (HCC) 09/01/1997   breast, left   Cataract 08/2021   Does not yet require surgery   GERD (gastroesophageal reflux disease) 06/2021   Endoscopy 11/29/21   Hyperparathyroidism, primary (HCC)    Osteoporosis    TIA (transient ischemic attack) 12/26/2000   NO RESIDUAL PROBLEM     Family History  Problem Relation Age of Onset   Hypertension Mother    Prostate cancer Father    Diabetes Father    Heart attack Maternal Grandmother    Heart disease Maternal Grandmother    Cancer Maternal Grandfather        oral cancer     Current Outpatient Medications:    Ascorbic Acid (VITAMIN C) 1000 MG tablet, Take 1,000 mg by mouth daily., Disp: , Rfl:    aspirin 81 MG tablet, Take 81 mg by mouth daily., Disp: , Rfl:    atenolol-chlorthalidone (TENORETIC) 100-25 MG tablet, Take 1/2 (one-half) tablet by mouth once daily, Disp: 45 tablet, Rfl: 3   B Complex-C (B-COMPLEX WITH VITAMIN C) tablet,  Take 1 tablet by mouth daily. 100mg , Disp: , Rfl:    Bempedoic Acid (NEXLETOL) 180 MG TABS, Take 1 tablet (180 mg total) by mouth daily., Disp: 90 tablet, Rfl: 1   BIOTIN 5000 PO, Take 1 tablet by mouth daily. , Disp: , Rfl:    cholecalciferol (VITAMIN D) 1000 UNITS tablet, Take 5,000 Units by mouth daily., Disp: , Rfl:    Cyanocobalamin (VITAMIN B-12 PO), Take 2,500 mg by mouth daily., Disp: , Rfl:    Multiple Vitamin (MULTIVITAMIN) capsule, Take 1 capsule by mouth daily., Disp: , Rfl:    No Known Allergies   Review of Systems  Constitutional: Negative.   Respiratory: Negative.    Cardiovascular: Negative.   Gastrointestinal: Negative.   Musculoskeletal: Negative.   Neurological: Negative.   Psychiatric/Behavioral: Negative.       Today's Vitals   06/25/23 1042  BP: 124/70  Pulse: (!) 57  Temp: 98.2 F (36.8 C)  TempSrc: Oral  Weight: 134 lb (60.8 kg)  Height: 5\' 3"  (1.6 m)  PainSc: 0-No pain   Body mass index is 23.74 kg/m.  Wt Readings from Last 3 Encounters:  06/26/23 137 lb 6.4 oz (62.3 kg)  06/25/23 134 lb (60.8 kg)  06/25/23 134 lb 9.6 oz (61.1 kg)     Objective:  Physical Exam Vitals reviewed.  Constitutional:  General: She is not in acute distress.    Appearance: Normal appearance.  Cardiovascular:     Rate and Rhythm: Normal rate and regular rhythm.     Pulses: Normal pulses.     Heart sounds: Normal heart sounds. No murmur heard. Pulmonary:     Effort: Pulmonary effort is normal. No respiratory distress.     Breath sounds: Normal breath sounds. No wheezing.  Skin:    General: Skin is warm and dry.     Capillary Refill: Capillary refill takes less than 2 seconds.  Neurological:     General: No focal deficit present.     Mental Status: She is alert and oriented to person, place, and time.     Cranial Nerves: No cranial nerve deficit.     Motor: No weakness.  Psychiatric:        Mood and Affect: Mood normal.        Behavior: Behavior normal.         Thought Content: Thought content normal.        Judgment: Judgment normal.         Assessment And Plan:  Mixed hyperlipidemia Assessment & Plan: Cholesterol levels are slightly back up. Continue nexlotolol will try for a PA. She is tolerating well.   Orders: -     Lipid panel  Abnormal glucose Assessment & Plan: HgbA1c is stable. Continue focusing on healthy diet and regular exercise  Orders: -     Hemoglobin A1c -     CMP14+EGFR  Acute left ankle pain Assessment & Plan: Pain to left ankle may be a sign of gout. Will check uric acid this pain has improved.   Orders: -     DG Ankle Complete Left; Future  Encounter for long-term current use of medication -     CMP14+EGFR -     Uric acid    Return for 6 month chol check.  Patient was given opportunity to ask questions. Patient verbalized understanding of the plan and was able to repeat key elements of the plan. All questions were answered to their satisfaction.    Jeanell Sparrow, FNP, have reviewed all documentation for this visit. The documentation on 07/01/23 for the exam, diagnosis, procedures, and orders are all accurate and complete.   IF YOU HAVE BEEN REFERRED TO A SPECIALIST, IT MAY TAKE 1-2 WEEKS TO SCHEDULE/PROCESS THE REFERRAL. IF YOU HAVE NOT HEARD FROM US/SPECIALIST IN TWO WEEKS, PLEASE GIVE [place] A CALL AT [phone] X [phone].

## 2023-06-25 NOTE — Patient Instructions (Signed)
Ms. Womac , Thank you for taking time to come for your Medicare Wellness Visit. I appreciate your ongoing commitment to your health goals. Please review the following plan we discussed and let me know if I can assist you in the future.   Referrals/Orders/Follow-Ups/Clinician Recommendations: none  This is a list of the screening recommended for you and due dates:  Health Maintenance  Topic Date Due   Colon Cancer Screening  04/20/2023   COVID-19 Vaccine (6 - 2023-24 season) 07/11/2023*   Flu Shot  11/30/2023*   Mammogram  09/24/2023   Medicare Annual Wellness Visit  06/24/2024   DTaP/Tdap/Td vaccine (2 - Td or Tdap) 05/18/2030   Pneumonia Vaccine  Completed   DEXA scan (bone density measurement)  Completed   Hepatitis C Screening  Completed   Zoster (Shingles) Vaccine  Completed   HPV Vaccine  Aged Out  *Topic was postponed. The date shown is not the original due date.    Advanced directives: (Declined) Advance directive discussed with you today. Even though you declined this today, please call our office should you change your mind, and we can give you the proper paperwork for you to fill out.  Next Medicare Annual Wellness Visit scheduled for next year: No, office will schedule  Insert Preventive Care attachment Insert FALL PREVENTION attachment if needed

## 2023-06-25 NOTE — Patient Instructions (Signed)
Go to [facility] at [employer] for your ankle xray

## 2023-06-25 NOTE — Progress Notes (Signed)
Subjective:   Adrienne Lopez is a 72 y.o. female who presents for Medicare Annual (Subsequent) preventive examination.  Visit Complete: In person  Patient Medicare AWV questionnaire was completed by the patient on 06/21/2023; I have confirmed that all information answered by patient is correct and no changes since this date.  Cardiac Risk Factors include: advanced age (>8men, >99 women);hypertension     Objective:    Today's Vitals   06/25/23 1036  BP: 124/70  Pulse: (!) 57  Temp: 98.2 F (36.8 C)  TempSrc: Oral  SpO2: 96%  Weight: 134 lb 9.6 oz (61.1 kg)  Height: 5' 3.8" (1.621 m)   Body mass index is 23.25 kg/m.     06/25/2023   10:42 AM 02/20/2023   11:41 AM 06/11/2022   10:54 AM 02/12/2022   10:15 AM 05/16/2021    8:46 AM 04/18/2020   10:25 AM 07/08/2013   11:54 PM  Advanced Directives  Does Patient Have a Medical Advance Directive? No No No No No No Patient would not like information;Patient does not have advance directive  Would patient like information on creating a medical advance directive?  No - Patient declined No - Patient declined No - Patient declined No - Patient declined Yes (MAU/Ambulatory/Procedural Areas - Information given)   Pre-existing out of facility DNR order (yellow form or pink MOST form)       No    Current Medications (verified) Outpatient Encounter Medications as of 06/25/2023  Medication Sig   Ascorbic Acid (VITAMIN C) 1000 MG tablet Take 1,000 mg by mouth daily.   aspirin 81 MG tablet Take 81 mg by mouth daily.   atenolol-chlorthalidone (TENORETIC) 100-25 MG tablet Take 1/2 (one-half) tablet by mouth once daily   B Complex-C (B-COMPLEX WITH VITAMIN C) tablet Take 1 tablet by mouth daily. 100mg    Bempedoic Acid (NEXLETOL) 180 MG TABS Take 1 tablet (180 mg total) by mouth daily.   BIOTIN 5000 PO Take 1 tablet by mouth daily.    cholecalciferol (VITAMIN D) 1000 UNITS tablet Take 5,000 Units by mouth daily.   Cyanocobalamin (VITAMIN B-12 PO)  Take 2,500 mg by mouth daily.   Multiple Vitamin (MULTIVITAMIN) capsule Take 1 capsule by mouth daily.   [DISCONTINUED] rosuvastatin (CRESTOR) 5 MG tablet Take 1 tablet (5 mg total) by mouth daily.   No facility-administered encounter medications on file as of 06/25/2023.    Allergies (verified) Patient has no known allergies.   History: Past Medical History:  Diagnosis Date   Cancer (HCC) 09/01/1997   breast, left   Cataract 08/2021   Does not yet require surgery   GERD (gastroesophageal reflux disease) 06/2021   Endoscopy 11/29/21   Hyperparathyroidism, primary (HCC)    Osteoporosis    TIA (transient ischemic attack) 12/26/2000   NO RESIDUAL PROBLEM   Past Surgical History:  Procedure Laterality Date   ABDOMINAL HYSTERECTOMY     BREAST LUMPECTOMY  09/01/1997   left   PARATHYROIDECTOMY N/A 05/06/2013   Procedure: PARATHYROIDECTOMY;  Surgeon: Velora Heckler, MD;  Location: [facility];  Service: General;  Laterality: N/A;   PARTIAL HYSTERECTOMY  09/01/1989   Family History  Problem Relation Age of Onset   Hypertension Mother    Prostate cancer Father    Diabetes Father    Heart attack Maternal Grandmother    Heart disease Maternal Grandmother    Cancer Maternal Grandfather        oral cancer   Social History   Socioeconomic History  Marital status: Divorced    Spouse name: Not on file   Number of children: Not on file   Years of education: Not on file   Highest education level: 12th grade  Occupational History   Not on file  Tobacco Use   Smoking status: Never   Smokeless tobacco: Never  Vaping Use   Vaping status: Never Used  Substance and Sexual Activity   Alcohol use: Never   Drug use: Never   Sexual activity: Not Currently    Birth control/protection: None  Other Topics Concern   Not on file  Social History Narrative   Not on file   Social Determinants of Health   Financial Resource Strain: Low Risk  (06/21/2023)   Overall Financial Resource Strain  (CARDIA)    Difficulty of Paying Living Expenses: Not hard at all  Food Insecurity: No Food Insecurity (06/21/2023)   Hunger Vital Sign    Worried About Running Out of Food in the Last Year: Never true    Ran Out of Food in the Last Year: Never true  Transportation Needs: No Transportation Needs (06/21/2023)   PRAPARE - Administrator, Civil Service (Medical): No    Lack of Transportation (Non-Medical): No  Physical Activity: Patient Declined (06/21/2023)   Exercise Vital Sign    Days of Exercise per Week: Patient declined    Minutes of Exercise per Session: Patient declined  Stress: No Stress Concern Present (06/21/2023)   [employer] of Occupational Health - Occupational Stress Questionnaire    Feeling of Stress : Not at all  Social Connections: Unknown (06/21/2023)   Social Connection and Isolation Panel [NHANES]    Frequency of Communication with Friends and Family: Twice a week    Frequency of Social Gatherings with Friends and Family: Once a week    Attends Religious Services: Patient declined    Database administrator or Organizations: No    Attends Engineer, structural: Not on file    Marital Status: Divorced    Tobacco Counseling Counseling given: Not Answered   Clinical Intake:  Pre-visit preparation completed: Yes  Pain : No/denies pain     Nutritional Status: BMI of 19-24  Normal Nutritional Risks: None Diabetes: No  How often do you need to have someone help you when you read instructions, pamphlets, or other written materials from your doctor or pharmacy?: 1 - Never  Interpreter Needed?: No  Information entered by :: NAllen LPN   Activities of Daily Living    06/21/2023    7:34 PM 02/20/2023   11:41 AM  In your present state of health, do you have any difficulty performing the following activities:  Hearing? 0 0  Vision? 0 0  Difficulty concentrating or making decisions? 0 0  Walking or climbing stairs? 0 0  Dressing  or bathing? 0 0  Doing errands, shopping? 0   Preparing Food and eating ? N   Using the Toilet? N   In the past six months, have you accidently leaked urine? N   Do you have problems with loss of bowel control? N   Managing your Medications? N   Managing your Finances? N   Housekeeping or managing your Housekeeping? N     Patient Care Team: Arnette Felts, FNP as PCP - General (General Practice) Rennis Golden Lisette Abu, MD as PCP - Cardiology (Cardiology) Chalmers Guest, MD as Consulting Physician (Ophthalmology)  Indicate any recent Medical Services you may have received from other than [facility]  providers in the past year (date may be approximate).     Assessment:   This is a routine wellness examination for Adrienne Lopez.  Hearing/Vision screen Hearing Screening - Comments:: Denies hearing issues Vision Screening - Comments:: Regular eye exams, Dr. Harlon Flor   Goals Addressed             This Visit's Progress    Patient Stated       06/25/2023, denies goals at this time       Depression Screen    06/25/2023   10:43 AM 05/07/2023    9:58 AM 02/26/2023   10:13 AM 10/29/2022    9:53 AM 06/11/2022   10:55 AM 05/01/2022    9:42 AM 05/16/2021    8:47 AM  PHQ 2/9 Scores  PHQ - 2 Score 0 0 0 0 0 0 0  PHQ- 9 Score 0 0 0        Fall Risk    06/21/2023    7:34 PM 05/07/2023    9:58 AM 02/26/2023   10:13 AM 10/29/2022    9:53 AM 06/11/2022   10:55 AM  Fall Risk   Falls in the past year? 0 0 0 0 0  Number falls in past yr: 0 0 0  0  Injury with Fall? 0 0 0  0  Risk for fall due to : Medication side effect No Fall Risks No Fall Risks  Medication side effect  Follow up Falls prevention discussed;Falls evaluation completed Falls evaluation completed Falls evaluation completed  Falls prevention discussed;Education provided;Falls evaluation completed    MEDICARE RISK AT HOME: Medicare Risk at Home Any stairs in or around the home?: No Home free of loose throw rugs in walkways, pet beds,  electrical cords, etc?: Yes Adequate lighting in your home to reduce risk of falls?: Yes Life alert?: No Use of a cane, walker or w/c?: No Grab bars in the bathroom?: No Shower chair or bench in shower?: No Elevated toilet seat or a handicapped toilet?: No  TIMED UP AND GO:  Was the test performed?  Yes  Length of time to ambulate 10 feet: 5 sec Gait steady and fast without use of assistive device    Cognitive Function:        06/25/2023   10:43 AM 06/11/2022   10:55 AM 05/16/2021    8:48 AM 04/18/2020    9:31 AM  6CIT Screen  What Year? 0 points 0 points 0 points 0 points  What month? 0 points 0 points 0 points 0 points  What time? 0 points 0 points 0 points 3 points  Count back from 20 0 points 0 points 0 points 0 points  Months in reverse 0 points 0 points 0 points 0 points  Repeat phrase 6 points 0 points 0 points 0 points  Total Score 6 points 0 points 0 points 3 points    Immunizations Immunization History  Administered Date(s) Administered   PFIZER(Purple Top)SARS-COV-2 Vaccination 10/31/2019, 11/29/2019, 07/14/2020, 06/13/2021   Pfizer Covid-19 Vaccine Bivalent Booster 82yrs & up 07/10/2022   Pneumococcal Conjugate-13 04/28/2020   Pneumococcal Polysaccharide-23 05/02/2021   Tdap 05/18/2020   Zoster Recombinant(Shingrix) 05/23/2021, 08/28/2021    TDAP status: Up to date  Flu Vaccine status: Declined, Education has been provided regarding the importance of this vaccine but patient still declined. Advised may receive this vaccine at local pharmacy or Health Dept. Aware to provide a copy of the vaccination record if obtained from local pharmacy or Health Dept. Verbalized acceptance  and understanding.  Pneumococcal vaccine status: Up to date  Covid-19 vaccine status: Information provided on how to obtain vaccines.   Qualifies for Shingles Vaccine? Yes   Zostavax completed Yes   Shingrix Completed?: Yes  Screening Tests Health Maintenance  Topic Date Due    Colonoscopy  04/20/2023   COVID-19 Vaccine (6 - 2023-24 season) 07/11/2023 (Originally 05/03/2023)   INFLUENZA VACCINE  11/30/2023 (Originally 04/02/2023)   MAMMOGRAM  09/24/2023   Medicare Annual Wellness (AWV)  06/24/2024   DTaP/Tdap/Td (2 - Td or Tdap) 05/18/2030   Pneumonia Vaccine 58+ Years old  Completed   DEXA SCAN  Completed   Hepatitis C Screening  Completed   Zoster Vaccines- Shingrix  Completed   HPV VACCINES  Aged Out    Health Maintenance  Health Maintenance Due  Topic Date Due   Colonoscopy  04/20/2023    Colorectal cancer screening: Type of screening: Colonoscopy. Completed 04/19/2018. Repeat every 5 years, scheduled for 09/07/2023  Mammogram status: Completed 09/23/2022. Repeat every year  Bone Density status: Completed 06/25/2020. R  Lung Cancer Screening: (Low Dose CT Chest recommended if Age 55-80 years, 20 pack-year currently smoking OR have quit w/in 15years.) does not qualify.   Lung Cancer Screening Referral: no  Additional Screening:  Hepatitis C Screening: does qualify; Completed 04/18/2020  Vision Screening: Recommended annual ophthalmology exams for early detection of glaucoma and other disorders of the eye. Is the patient up to date with their annual eye exam?  Yes  Who is the provider or what is the name of the office in which the patient attends annual eye exams? Dr. Harlon Flor If pt is not established with a provider, would they like to be referred to a provider to establish care? No .   Dental Screening: Recommended annual dental exams for proper oral hygiene  Diabetic Foot Exam: n/a  Community Resource Referral / Chronic Care Management: CRR required this visit?  No   CCM required this visit?  No     Plan:     I have personally reviewed and noted the following in the patient's chart:   Medical and social history Use of alcohol, tobacco or illicit drugs  Current medications and supplements including opioid prescriptions. Patient is not  currently taking opioid prescriptions. Functional ability and status Nutritional status Physical activity Advanced directives List of other physicians Hospitalizations, surgeries, and ER visits in previous 12 months Vitals Screenings to include cognitive, depression, and falls Referrals and appointments  In addition, I have reviewed and discussed with patient certain preventive protocols, quality metrics, and best practice recommendations. A written personalized care plan for preventive services as well as general preventive health recommendations were provided to patient.     Barb Merino, LPN   57/32/2025   After Visit Summary: (In Person-Printed) AVS printed and given to the patient  Nurse Notes: none

## 2023-06-26 ENCOUNTER — Ambulatory Visit: Payer: Medicare Other | Attending: Internal Medicine | Admitting: Internal Medicine

## 2023-06-26 ENCOUNTER — Encounter: Payer: Self-pay | Admitting: Internal Medicine

## 2023-06-26 VITALS — BP 150/76 | HR 60 | Ht 63.0 in | Wt 137.4 lb

## 2023-06-26 DIAGNOSIS — I491 Atrial premature depolarization: Secondary | ICD-10-CM

## 2023-06-26 DIAGNOSIS — I493 Ventricular premature depolarization: Secondary | ICD-10-CM

## 2023-06-26 DIAGNOSIS — R748 Abnormal levels of other serum enzymes: Secondary | ICD-10-CM

## 2023-06-26 DIAGNOSIS — E785 Hyperlipidemia, unspecified: Secondary | ICD-10-CM

## 2023-06-26 DIAGNOSIS — I10 Essential (primary) hypertension: Secondary | ICD-10-CM | POA: Diagnosis not present

## 2023-06-26 LAB — CMP14+EGFR
ALT: 16 [IU]/L (ref 0–32)
AST: 34 [IU]/L (ref 0–40)
Albumin: 4.5 g/dL (ref 3.8–4.8)
Alkaline Phosphatase: 48 [IU]/L (ref 44–121)
BUN/Creatinine Ratio: 28 (ref 12–28)
BUN: 29 mg/dL — ABNORMAL HIGH (ref 8–27)
Bilirubin Total: 0.4 mg/dL (ref 0.0–1.2)
CO2: 24 mmol/L (ref 20–29)
Calcium: 10 mg/dL (ref 8.7–10.3)
Chloride: 98 mmol/L (ref 96–106)
Creatinine, Ser: 1.03 mg/dL — ABNORMAL HIGH (ref 0.57–1.00)
Globulin, Total: 3.6 g/dL (ref 1.5–4.5)
Glucose: 74 mg/dL (ref 70–99)
Potassium: 3.6 mmol/L (ref 3.5–5.2)
Sodium: 138 mmol/L (ref 134–144)
Total Protein: 8.1 g/dL (ref 6.0–8.5)
eGFR: 57 mL/min/{1.73_m2} — ABNORMAL LOW (ref >59)

## 2023-06-26 LAB — LIPID PANEL
Chol/HDL Ratio: 2.2 ratio (ref 0.0–4.4)
Cholesterol, Total: 175 mg/dL (ref 100–199)
HDL: 81 mg/dL (ref >39)
LDL Chol Calc (NIH): 83 mg/dL (ref 0–99)
Triglycerides: 54 mg/dL (ref 0–149)
VLDL Cholesterol Cal: 11 mg/dL (ref 5–40)

## 2023-06-26 LAB — URIC ACID: Uric Acid: 8.9 mg/dL — ABNORMAL HIGH (ref 3.1–7.9)

## 2023-06-26 LAB — HEMOGLOBIN A1C
Est. average glucose Bld gHb Est-mCnc: 131 mg/dL
Hgb A1c MFr Bld: 6.2 % — ABNORMAL HIGH (ref 4.8–5.6)

## 2023-06-26 NOTE — Progress Notes (Unsigned)
OFFICE NOTE  Chief Complaint:  Follow-up  Primary Care Physician: Arnette Felts, FNP  HPI:  Adrienne Lopez is a pleasant 72 year old female from [place]. She was noted to be hypercalcemic and ultimately diagnosed with hyperparathyroidism. She underwent parathyroid surgery by Dr. Gerrit Friends in September. She did well since then however subsequently developed palpitations. She describes the palpitations as a hard thumping in her chest and neck were occurring several times a day after surgery. The symptoms have improved somewhat and it seems to be coincidental with her taking aspirin.  She does have a history of TIA in the past, but the episode was unclear as she "blacked out" when she was taking medication for breast cancer, specifically tamoxifen. She was taken off of that at that time. She did have a history of breast cancer and underwent chest wall radiation in 1999 but has been free of recurrence. Otherwise she has no other medical history and is currently not on any medications other than aspirin. He recently had blood work through Dr. Theora Gianotti office which indicates her thyroid function is low normal but her parathyroid is within normal limits and her calcium is now 9.3. She reports her palpitations occur a couple times a day and feel like a strong beating of the heart that causes her some mild shortness of breath. She denies any chest pain or anginal symptoms. There is no significant family history of heart disease.   At her last visit I recommended an echocardiogram and 48 hour monitor. She did wear the monitor for 48 hours which reported actually a number of both ventricular and atrial ectopic beats. Infectious thousand and 26 bigeminal ventricular beats. There was no significant nonsustained VT but there was some sustained supraventricular tachycardia.  She underwent an echocardiogram which was essentially normal except for some mitral regurgitation, but no wall motion abnormalities and preserved  EF of 55-60%.  Subsequent to this, she had an episode of chest pain which awoke her up at night on November 7. She went to the emergency department with symptoms of chest tightness and left arm pain. The symptoms resolved fairly quickly and she ruled out for MI. She did report some palpitations associated with this event.  In addition to her parathyroid problems, there is a question about thyroid abnormality as well. She is currently undergoing testing for this. She says all of these symptoms have come on since her surgery for the parathyroid glands.  Adrienne Lopez returns today and reports feeling well. She continues to have infrequent palpitations, despite atenolol.  She denies any chest pain or worsening shortness of breath. She's had no further TIA events. She's been compliant with daily aspirin.  12/02/2016  Adrienne Lopez returns for follow-up. Over the past year she has done well without complaints. She denies any palpitations. She followed up with Dr. Talmage Nap and was noted to have elevated blood sugars - she was told to lose 10 pounds and she was able to successfully. BP was mildly elevated today, but came down to 134/86 after a re-check.  01/05/2018  Adrienne Lopez was seen today in follow-up.  Again she continues to do well.  She denies chest pain or worsening shortness of breath.  She has no palpitations.  Labs from July 2018 showed total cholesterol 195, HDL 66, LDL 111 and triglycerides 92.  Serum creatinine of 1.07.  Blood pressures been stable.  EKG today shows sinus bradycardia at 53.  05/03/2019  Adrienne Lopez is seen today for follow-up of hypertension.  Recently I did a telemedicine visit with her and her blood pressure was elevated.  I asked her to take some home blood pressure readings and submit that to me.  This was done in late May and early June.  Her blood pressures averaged between 130 and 140 systolic in the morning and generally between 115-120 2 in the evening.  We had readjusted her  medications to 50 mg of atenolol once a day in the morning.  Overall though I am not convinced that we have had very good control of her morning blood pressures.  06/02/2019  Adrienne Lopez returns today for follow-up of hypertension.  After further adjusting her medication it seems that we have now improved her blood pressures adequately.  She is currently taking atenolol 50 mg in the morning and chlorthalidone 12.5 mg daily.  She says this medicine has been difficult to cut up and oftentimes pulverize is the pill.  We discussed the possibility of taking the higher dose atenolol 100 mg / 25 mg combination with chlorthalidone and breaking it in half since the tablet is scored.  11/19/2020  Adrienne Lopez is seen today in follow-up.  Unfortunately January was not a good month for her.  It sounds like she may have had a TIA.  She had initial symptoms of right arm numbness and tingling as well as heaviness.  She also had unilateral headache and fullness in her ear with hearing loss.  This did resolve after less than 24 hours.  She saw her PCP who ordered a CT scan of her head which apparently showed no acute findings.  She was off of her aspirin and that was restarted.  She is also now on low-dose atorvastatin.  Labs as of August 2021 showed total cholesterol 246, HDL 71, LDL 157 and triglycerides 102.  If this was TIA then her target is an LDL less than 70.  Her history also suggest that she had TIA in the distant past as well.  She felt some palpitations recently however they were short-lived.  EKG today shows a sinus bradycardia at 59.  01/10/2021  Adrienne Lopez returns today for follow-up.  She underwent a work-up for her TIA including an echo which showed some mild valvular insufficiency but normal LV function and negative for PFO.  Carotid Doppler showed minimal to near normal findings.  She did have a repeat lipid which showed further improvement from her labs a month ago.  Total cholesterol is now 156,  triglycerides 75, HDL 69 and LDL 73 down from an LDL of 90 and 157 approximately 8 months ago.  Overall she feels well.  06/09/2022  Adrienne Lopez is seen today in follow-up.  Overall she seems to be doing pretty well.  She has had no further TIA or strokelike symptoms.  EKG shows a sinus bradycardia.  Her blood pressure is very mildly elevated today.  She did undergo a GI work-up at [facility] including endoscopy which showed gastric antral mucosal inflammation.  This was thought to be due to aspirin however aspirin is recommended due to TIA and therefore it was recommended for her to use famotidine in addition to it.  She was keen on stopping both potentially but understands the importance of those medications.  06/26/2023  Adrienne Lopez is seen today in follow-up.  She has had some recent health findings.  She was noted to have elevated liver enzymes necessitating the discontinuation of her statin.  She then underwent a liver biopsy which showed mild chronic  portal inflammation.  She brought a letter today from her liver specialist suggesting that however it was unlikely the cause of your elevated liver enzymes.  And that she should discuss with me the possibility of switching statins.  After being off statin therapy her total cholesterol recently was 221, triglycerides 95, HDL 70 and LDL 134 however recent testing a few days ago showed total cholesterol 175, triglycerides 54, HDL 81 and LDL 83.  PMHx:  Past Medical History:  Diagnosis Date   Cancer (HCC) 09/01/1997   breast, left   Cataract 08/2021   Does not yet require surgery   GERD (gastroesophageal reflux disease) 06/2021   Endoscopy 11/29/21   Hyperparathyroidism, primary (HCC)    Osteoporosis    TIA (transient ischemic attack) 12/26/2000   NO RESIDUAL PROBLEM    Past Surgical History:  Procedure Laterality Date   ABDOMINAL HYSTERECTOMY     BREAST LUMPECTOMY  09/01/1997   left   PARATHYROIDECTOMY N/A 05/06/2013   Procedure:  PARATHYROIDECTOMY;  Surgeon: Velora Heckler, MD;  Location: [facility];  Service: General;  Laterality: N/A;   PARTIAL HYSTERECTOMY  09/01/1989    FAMHx:  Family History  Problem Relation Age of Onset   Hypertension Mother    Prostate cancer Father    Diabetes Father    Heart attack Maternal Grandmother    Heart disease Maternal Grandmother    Cancer Maternal Grandfather        oral cancer    SOCHx:   reports that she has never smoked. She has never used smokeless tobacco. She reports that she does not drink alcohol and does not use drugs.  ALLERGIES:  No Known Allergies  ROS: Pertinent items noted in HPI and remainder of comprehensive ROS otherwise negative.  HOME MEDS: Current Outpatient Medications  Medication Sig Dispense Refill   Ascorbic Acid (VITAMIN C) 1000 MG tablet Take 1,000 mg by mouth daily.     aspirin 81 MG tablet Take 81 mg by mouth daily.     atenolol-chlorthalidone (TENORETIC) 100-25 MG tablet Take 1/2 (one-half) tablet by mouth once daily 45 tablet 3   B Complex-C (B-COMPLEX WITH VITAMIN C) tablet Take 1 tablet by mouth daily. 100mg      Bempedoic Acid (NEXLETOL) 180 MG TABS Take 1 tablet (180 mg total) by mouth daily. 90 tablet 1   BIOTIN 5000 PO Take 1 tablet by mouth daily.      cholecalciferol (VITAMIN D) 1000 UNITS tablet Take 5,000 Units by mouth daily.     Cyanocobalamin (VITAMIN B-12 PO) Take 2,500 mg by mouth daily.     Multiple Vitamin (MULTIVITAMIN) capsule Take 1 capsule by mouth daily.     No current facility-administered medications for this visit.    LABS/IMAGING: No results found for this or any previous visit (from the past 48 hour(s)). No results found.  VITALS: BP (!) 150/76 (BP Location: Right Arm, Patient Position: Sitting, Cuff Size: Normal)   Pulse 60   Ht 5\' 3"  (1.6 m)   Wt 137 lb 6.4 oz (62.3 kg)   SpO2 95%   BMI 24.34 kg/m   EXAM: General appearance: alert and no distress Lungs: clear to auscultation bilaterally Heart:  regular rate and rhythm, S1, S2 normal, no murmur, click, rub or gallop Psych: Pleasant  EKG: EKG Interpretation Date/Time:  Friday June 26 2023 11:16:52 EDT Ventricular Rate:  60 PR Interval:  166 QRS Duration:  84 QT Interval:  394 QTC Calculation: 394 R Axis:   36  Text Interpretation: Normal sinus rhythm Normal ECG When compared with ECG of 08-Jul-2013 19:26, PREVIOUS ECG IS PRESENT No significant change since last tracing Confirmed by Zoila Shutter [phone]) on 06/26/2023 11:34:35 AM    ASSESSMENT: Chest pain-resolved, low risk NST on 08/04/2013 Continued palpitations - PAC's and PVC;s with bigeminy Recent hyperparathyroidism status post surgery 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 Gastritis Elevated liver enzymes with a liver biopsy showing mild chronic inflammation  PLAN: 1.   Adrienne Lopez had to stop her statin due to elevated liver enzymes and was found to have mild chronic inflammation on her liver biopsy but this was not thought to be due to her statin.  Subsequently she was switched to [facility] however has not been able to get prior authorization for this.  She has been taking samples with an improvement in her lipids.  I suggested she may be able to tolerate low-dose rosuvastatin 5 mg daily as well.  Will defer to her PCP who is working on authorization for the [employer].  If this becomes cost prohibitive, I again would consider low-dose rosuvastatin.  Plan follow-up in 6 months or sooner as necessary.  Chrystie Nose, MD, [facility], FACP  [place]  [facility]  Medical Director of the [facility] Lipid Disorders &  Cardiovascular Risk Reduction Clinic Diplomate of the American Board of Clinical Lipidology Attending Cardiologist  Direct Dial: [phone]  Fax: [phone]  Website:  [email or web address]  Contina Strain 06/26/2023, 11:34 AM

## 2023-06-26 NOTE — Patient Instructions (Signed)
Medication Instructions:  No change  *If you need a refill on your cardiac medications before your next appointment, please call your pharmacy*    Follow-Up: At [facility], you and your health needs are our priority.  As part of our continuing mission to provide you with exceptional heart care, we have created designated Provider Care Teams.  These Care Teams include your primary Cardiologist (physician) and Advanced Practice Providers (APPs -  Physician Assistants and Nurse Practitioners) who all work together to provide you with the care you need, when you need it.   Your next appointment:   6 month(s)  Provider:   Chrystie Nose, MD

## 2023-07-01 ENCOUNTER — Encounter: Payer: Self-pay | Admitting: Nurse Practitioner

## 2023-07-01 DIAGNOSIS — M25572 Pain in left ankle and joints of left foot: Secondary | ICD-10-CM | POA: Insufficient documentation

## 2023-07-01 NOTE — Assessment & Plan Note (Signed)
Pain to left ankle may be a sign of gout. Will check uric acid this pain has improved.

## 2023-07-01 NOTE — Assessment & Plan Note (Signed)
Cholesterol levels are slightly back up. Continue nexlotolol will try for a PA. She is tolerating well.

## 2023-07-01 NOTE — Assessment & Plan Note (Signed)
HgbA1c is stable. Continue focusing on healthy diet and regular exercise.

## 2023-07-08 ENCOUNTER — Other Ambulatory Visit (INDEPENDENT_AMBULATORY_CARE_PROVIDER_SITE_OTHER): Payer: Medicare Other

## 2023-07-08 ENCOUNTER — Encounter: Payer: Self-pay | Admitting: Orthopaedic Surgery

## 2023-07-08 ENCOUNTER — Ambulatory Visit: Payer: Medicare Other | Admitting: Orthopaedic Surgery

## 2023-07-08 ENCOUNTER — Encounter: Payer: Self-pay | Admitting: Nurse Practitioner

## 2023-07-08 DIAGNOSIS — G8929 Other chronic pain: Secondary | ICD-10-CM | POA: Diagnosis not present

## 2023-07-08 DIAGNOSIS — M2141 Flat foot [pes planus] (acquired), right foot: Secondary | ICD-10-CM

## 2023-07-08 DIAGNOSIS — M6289 Other specified disorders of muscle: Secondary | ICD-10-CM | POA: Diagnosis not present

## 2023-07-08 DIAGNOSIS — M25572 Pain in left ankle and joints of left foot: Secondary | ICD-10-CM

## 2023-07-08 DIAGNOSIS — M2142 Flat foot [pes planus] (acquired), left foot: Secondary | ICD-10-CM | POA: Diagnosis not present

## 2023-07-08 NOTE — Progress Notes (Signed)
Office Visit Note   Patient: Adrienne Lopez           Date of Birth: 03/09/1951           MRN: [id number] Visit Date: 07/08/2023              Requested by: Arnette Felts, FNP [address] [place],  [place] [address] PCP: Arnette Felts, FNP   Assessment & Plan: Visit Diagnoses:  1. Chronic pain of left ankle   2. Tightness of both gastrocnemius muscles   3. Pes planus of both feet     Plan: Left ankle pain.  Explained to her that there were no radiographic findings to explain her ankle pain.  Recommended several treatment options including conservative treatment with change of shoe wear like for her to avoid open back shoes.  Also get an arch support.  Suggested stretching for hamstrings handouts were given offered formal therapy.  She deferred.  Offered her cortisone injection left ankle for both diagnostic and hopefully therapeutic purposes she deferred.  She does not wish to take any oral medications either.  We will see how she does with the conservative measures if she fails to improve she will follow-up with [place].  Questions were encouraged and answered at length by Dr. Magnus Ivan and myself.  Follow-Up Instructions: Return if symptoms worsen or fail to improve.   Orders:  Orders Placed This Encounter  Procedures   XR Ankle Complete Left   No orders of the defined types were placed in this encounter.     Procedures: No procedures performed   Clinical Data: No additional findings.   Subjective: Chief Complaint  Patient presents with   Left Ankle - Pain    HPI Mrs. Adrienne Lopez is a 72 year old female were seen today for new complaint of left ankle pain.  We have seen her in the past for her shoulder.  She states with exercise that her shoulder pain went away.  She has had on and off left ankle pain for over a year.  Is been worse over the last few weeks.  No known injury.  She states that she has severe pain in her ankle several times a month but no daily pain.   Denies any mechanical symptoms of the ankle.  Denies any injury to the ankle.  She takes no medication for this.  She does note that she has a history of gout but denies any swelling in the ankle.  She points to the ankle as being global pain.  She is prediabetic with a hemoglobin A1c of 6.2.  Review of Systems   Objective: Vital Signs: There were no vitals taken for this visit.  Physical Exam General: Well-developed well-nourished female no acute distress mood affect appropriate. Psych: Alert and oriented x 3.   Respirations: Unlabored. Vascular: Dorsal pedal pulse 2+ equal symmetric bilaterally.  Calves are supple nontender. Ortho Exam Bilateral feet: Sensation is intact to light touch.  5 out of 5 strength with inversion eversion dorsiflexion plantarflexion against resistance bilaterally.  Nontender over the sinus Tarsi regions bilaterally.  Nontender over the posterior tibial tendons peroneal tendons bilaterally.  Able to do a single heel raise bilaterally but has pain globally about the left ankle with single heel raise on the left only.  No over the Achilles bilaterally.  Achilles are intact bilaterally.  Nontender over the medial tubercle of calcaneus bilaterally.  No rashes skin lesions ulcerations of either foot.  Hallux valgus deformity bilateral.  Tight gastrocs  bilaterally.  Bilateral pes planus.  Too many toes sign bilaterally.  Specialty Comments:  No specialty comments available.  Imaging: XR Ankle Complete Left  Result Date: 07/08/2023 Left ankle 3 views: Talus well located within the ankle mortise no diastases.  No acute fracture.  No significant arthritic changes.    PMFS History: Patient Active Problem List   Diagnosis Date Noted   Acute left ankle pain 07/01/2023   Encounter for annual health examination 05/24/2023   Influenza vaccination declined 05/24/2023   Statin intolerance 05/24/2023   Mixed hyperlipidemia 02/26/2023   Dermatofibroma 10/29/2022   Elevated  liver enzymes 10/29/2022   Abnormal MRI, liver 10/29/2022   Abnormal glucose 10/29/2022   Essential hypertension 04/24/2021   Gastroesophageal reflux disease without esophagitis 04/24/2021   History of TIA (transient ischemic attack) 05/23/2014   PVC (premature ventricular contraction) 07/25/2013   PAC (premature atrial contraction) 07/25/2013   Chest pain 07/08/2013   Palpitations 06/30/2013   Dyspnea 06/30/2013   Hyperparathyroidism, primary (HCC) 04/22/2013   Past Medical History:  Diagnosis Date   Cancer (HCC) 09/01/1997   breast, left   Cataract 08/2021   Does not yet require surgery   GERD (gastroesophageal reflux disease) 06/2021   Endoscopy 11/29/21   Hyperparathyroidism, primary (HCC)    Osteoporosis    TIA (transient ischemic attack) 12/26/2000   NO RESIDUAL PROBLEM    Family History  Problem Relation Age of Onset   Hypertension Mother    Prostate cancer Father    Diabetes Father    Heart attack Maternal Grandmother    Heart disease Maternal Grandmother    Cancer Maternal Grandfather        oral cancer    Past Surgical History:  Procedure Laterality Date   ABDOMINAL HYSTERECTOMY     BREAST LUMPECTOMY  09/01/1997   left   PARATHYROIDECTOMY N/A 05/06/2013   Procedure: PARATHYROIDECTOMY;  Surgeon: Velora Heckler, MD;  Location: [facility];  Service: General;  Laterality: N/A;   PARTIAL HYSTERECTOMY  09/01/1989   Social History   Occupational History   Not on file  Tobacco Use   Smoking status: Never   Smokeless tobacco: Never  Vaping Use   Vaping status: Never Used  Substance and Sexual Activity   Alcohol use: Never   Drug use: Never   Sexual activity: Not Currently    Birth control/protection: None

## 2023-07-15 ENCOUNTER — Ambulatory Visit: Payer: Medicare Other | Admitting: Orthopaedic Surgery

## 2023-07-17 ENCOUNTER — Other Ambulatory Visit: Payer: Self-pay | Admitting: Internal Medicine

## 2023-07-17 ENCOUNTER — Encounter: Payer: Self-pay | Admitting: Nurse Practitioner

## 2023-07-20 ENCOUNTER — Encounter: Payer: Self-pay | Admitting: Nurse Practitioner

## 2023-07-28 ENCOUNTER — Other Ambulatory Visit: Payer: Self-pay | Admitting: Nurse Practitioner

## 2023-07-28 DIAGNOSIS — E782 Mixed hyperlipidemia: Secondary | ICD-10-CM

## 2023-07-29 DIAGNOSIS — R748 Abnormal levels of other serum enzymes: Secondary | ICD-10-CM | POA: Diagnosis not present

## 2023-07-29 DIAGNOSIS — D1803 Hemangioma of intra-abdominal structures: Secondary | ICD-10-CM | POA: Diagnosis not present

## 2023-08-03 ENCOUNTER — Telehealth: Payer: Self-pay

## 2023-08-03 NOTE — Progress Notes (Signed)
   Care Guide Note  08/03/2023 Name: Adrienne Lopez MRN: [id number] DOB: 18-Sep-1950  Referred by: Arnette Felts, FNP Reason for referral : Care Coordination (Outreach to schedule with Pharm d )   Adrienne Lopez is a 72 y.o. year old female who is a primary care patient of Arnette Felts, FNP. Adrienne Lopez was referred to the pharmacist for assistance related to HLD.    Successful contact was made with the patient to discuss pharmacy services including being ready for the pharmacist to call at least 5 minutes before the scheduled appointment time, to have medication bottles and any blood sugar or blood pressure readings ready for review. The patient agreed to meet with the pharmacist via with the pharmacist via telephone visit on (date/time).  08/05/2023  Penne Lash , RMA       [facility], [facility] Guide  Direct Dial: [phone]  Website: [email or web address]

## 2023-08-05 ENCOUNTER — Other Ambulatory Visit: Payer: Medicare Other

## 2023-08-05 DIAGNOSIS — Z2821 Immunization not carried out because of patient refusal: Secondary | ICD-10-CM

## 2023-08-05 DIAGNOSIS — E782 Mixed hyperlipidemia: Secondary | ICD-10-CM

## 2023-08-05 DIAGNOSIS — I10 Essential (primary) hypertension: Secondary | ICD-10-CM

## 2023-08-05 MED ORDER — ATENOLOL 50 MG PO TABS: 50.0000 mg | ORAL_TABLET | Freq: Every day | ORAL | 3 refills | Status: DC

## 2023-08-05 MED ORDER — LOSARTAN POTASSIUM 50 MG PO TABS: 50.0000 mg | ORAL_TABLET | Freq: Every day | ORAL | 1 refills | Status: DC

## 2023-08-05 MED ORDER — NEXLIZET 180-10 MG PO TABS: 1.0000 | ORAL_TABLET | Freq: Every day | ORAL | 3 refills | Status: DC

## 2023-08-05 NOTE — Progress Notes (Signed)
Spoke with Dr. Rennis Golden. Stop chlorthalidone/atenolol combination. Start losartan 50 mg daily and atenolol 50 mg daily separately. BMP scheduled in 2 weeks. 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   Catie Eppie Gibson, PharmD, BCACP, CPP Clinical Pharmacist [facility] [phone]

## 2023-08-05 NOTE — Progress Notes (Signed)
08/05/2023 Name: Adrienne Lopez MRN: [id number] DOB: [birth year]  Chief Complaint  Patient presents with   Medication Management    Adrienne Lopez is a 72 y.o. year old female who presented for a telephone visit.   They were referred to the pharmacist by their PCP for assistance in managing hyperlipidemia.    Subjective:  Care Team: Primary Care Provider: Arnette Felts, FNP ; Next Scheduled Visit: 12/24/2023  Medication Access/Adherence  Current Pharmacy:  [id number] - [place] ([place]), [place] - [address]  ([place]) [place] [address] Phone: [phone] Fax: [phone]   Patient reports affordability concerns with their medications: Yes  Patient reports access/transportation concerns to their pharmacy: No  Patient reports adherence concerns with their medications:  No     Hyperlipidemia/ASCVD Risk Reduction  Current lipid lowering medications: nothing currently Medications tried in the past:  - Previously on atorvastatin 10 mg, 20 mg, and 40 mg with liver enzyme elevations- stopped March 2024 - Rosuvastatin 5 mg July - September; August liver enzymes were elevated AST= 42; ALT = 40 - Nexletol and then Nexlizet via samples from September until recently   Antiplatelet regimen: aspirin 81 mg daily    Clinical ASCVD: No  The 10-year ASCVD risk score (Arnett DK, et al., 2019) is: 18.5%   Values used to calculate the score:     Age: 27 years     Sex: Female     Is Non-Hispanic African American: Yes     Diabetic: No     Tobacco smoker: No     Systolic Blood Pressure: 150 mmHg     Is BP treated: Yes     HDL Cholesterol: 81 mg/dL     Total Cholesterol: 175 mg/dL   Hypertension:  Current medications: atenolol/chlorthalidone 100/25 mg - 1/2 tablet daily   Patient denies hypotensive s/sx including dizziness, lightheadedness.  Patient denies hypertensive symptoms including headache, chest pain, shortness of  breath   Objective:  Lab Results  Component Value Date   HGBA1C 6.2 (H) 06/25/2023    Lab Results  Component Value Date   CREATININE 1.03 (H) 06/25/2023   BUN 29 (H) 06/25/2023   NA 138 06/25/2023   K 3.6 06/25/2023   CL 98 06/25/2023   CO2 24 06/25/2023    Lab Results  Component Value Date   CHOL 175 06/25/2023   HDL 81 06/25/2023   LDLCALC 83 06/25/2023   TRIG 54 06/25/2023   CHOLHDL 2.2 06/25/2023    Medications Reviewed Today     Reviewed by Alden Hipp, RPH-CPP (Pharmacist) on 08/05/23 at 1316  Med List Status: <None>   Medication Order Taking? Sig Documenting Provider Last Dose Status Informant  Ascorbic Acid (VITAMIN C) 1000 MG tablet [id number] Yes Take 1,000 mg by mouth daily. [provider] Taking Active Self  aspirin 81 MG tablet [id number] Yes Take 81 mg by mouth daily. [provider] Taking Active   atenolol-chlorthalidone (TENORETIC) 100-25 MG tablet [id number] Yes Take 1/2 (one-half) tablet by mouth once daily Hilty, Lisette Abu, MD Taking Active   B Complex-C (B-COMPLEX WITH VITAMIN C) tablet [id number] Yes Take 1 tablet by mouth daily. 100mg  [provider] Taking Active Self  Bempedoic Acid (NEXLETOL) 180 MG TABS [id number] No Take 1 tablet (180 mg total) by mouth daily.  Patient not taking: Reported on 08/05/2023   Arnette Felts, FNP Not Taking Active   BIOTIN 5000 PO [id number] Yes Take 1 tablet by mouth daily.  [provider] Taking Active Self  cholecalciferol (VITAMIN D) 1000 UNITS tablet [id number] Yes Take 5,000 Units by mouth daily. [provider] Taking Active   Cyanocobalamin (VITAMIN B-12 PO) [id number] Yes Take 2,500 mg by mouth daily. [provider] Taking Active   Multiple Vitamin (MULTIVITAMIN) capsule [id number] Yes Take 1 capsule by mouth daily. [provider] Taking Active               Assessment/Plan:    Hyperlipidemia/ASCVD Risk Reduction: - Currently  uncontrolled. Given prior liver enzyme elevations with rosuvastatin 5 mg daily and several doses of atorvastatin daily, would avoid statin therapy moving forward. Patient appears to be tolerating Nexlitol/Nexlizet samples, however, bempedoic acid may raise uric acid up to ~0.9 mg/dL. Discussed this with patient. Also discussed alternative option of PCSK9i, pending insurance coverage. She does not want to start allopurinol at this time, would like to monitor. See chlorthalidone recommendation below.  - Recommend to start Nexlizet daily. Assisted with application for [facility]. Completed Nexlizet PA. Approved for both.  - Discussed all of the above with PCP. She is in agreement. Will collaborate to place orders.   Hypertension: - Currently controlled, but chlorthalidone may be increasing uric acid by ~0.9 mg/dL - Will discuss with Dr. Rennis Golden if changing chlorthalidone to an alternative agent would be appropriate.    Follow Up Plan: 6 weeks.   Catie Eppie Gibson, PharmD, BCACP, CPP Clinical Pharmacist [facility] [phone]

## 2023-08-17 ENCOUNTER — Telehealth: Payer: Self-pay | Admitting: Pharmacist

## 2023-08-17 NOTE — Telephone Encounter (Signed)
She can come and pick up samples.

## 2023-08-17 NOTE — Telephone Encounter (Signed)
Received voicemail from patient that [facility] was not running [employer] card as copay was $114. Contacted [facility]. They are fixing how they ran the prescription. Contacted patient to notify.   Catie Eppie Gibson, PharmD, BCACP, CPP Clinical Pharmacist [facility] [phone]

## 2023-08-21 ENCOUNTER — Other Ambulatory Visit: Payer: Medicare Other

## 2023-08-21 DIAGNOSIS — I10 Essential (primary) hypertension: Secondary | ICD-10-CM | POA: Diagnosis not present

## 2023-08-21 LAB — BASIC METABOLIC PANEL
BUN/Creatinine Ratio: 21 (ref 12–28)
BUN: 22 mg/dL (ref 8–27)
CO2: 24 mmol/L (ref 20–29)
Calcium: 9.7 mg/dL (ref 8.7–10.3)
Chloride: 101 mmol/L (ref 96–106)
Creatinine, Ser: 1.07 mg/dL — ABNORMAL HIGH (ref 0.57–1.00)
Glucose: 70 mg/dL (ref 70–99)
Potassium: 4.1 mmol/L (ref 3.5–5.2)
Sodium: 138 mmol/L (ref 134–144)
eGFR: 55 mL/min/{1.73_m2} — ABNORMAL LOW (ref >59)

## 2023-09-07 DIAGNOSIS — K5730 Diverticulosis of large intestine without perforation or abscess without bleeding: Secondary | ICD-10-CM | POA: Diagnosis not present

## 2023-09-07 DIAGNOSIS — Z1211 Encounter for screening for malignant neoplasm of colon: Secondary | ICD-10-CM | POA: Diagnosis not present

## 2023-09-07 DIAGNOSIS — K635 Polyp of colon: Secondary | ICD-10-CM | POA: Diagnosis not present

## 2023-09-07 DIAGNOSIS — D122 Benign neoplasm of ascending colon: Secondary | ICD-10-CM | POA: Diagnosis not present

## 2023-09-07 LAB — HM COLONOSCOPY

## 2023-09-21 ENCOUNTER — Telehealth: Payer: Self-pay | Admitting: Pharmacist

## 2023-09-21 ENCOUNTER — Other Ambulatory Visit: Payer: Self-pay | Admitting: Pharmacist

## 2023-09-21 DIAGNOSIS — Z789 Other specified health status: Secondary | ICD-10-CM

## 2023-09-21 DIAGNOSIS — T466X5A Adverse effect of antihyperlipidemic and antiarteriosclerotic drugs, initial encounter: Secondary | ICD-10-CM

## 2023-09-21 NOTE — Progress Notes (Signed)
Attempted to contact patient for scheduled appointment for medication management. Left HIPAA compliant message for patient to return my call at their convenience.   Catie T. Harper, PharmD, BCACP, CPP Clinical Pharmacist [facility] [phone]  

## 2023-09-21 NOTE — Progress Notes (Signed)
09/21/2023 Name: Adrienne Lopez MRN: [id number] DOB: 09/20/1950  No chief complaint on file.   Adrienne Lopez is a 73 y.o. year old female who presented for a telephone visit.   They were referred to the pharmacist by their PCP for assistance in managing hypertension and hyperlipidemia.    Subjective:  Care Team: Primary Care Provider: Arnette Felts, FNP ; Next Scheduled Visit: 12/24/23  Medication Access/Adherence  Current Pharmacy:  [facility] - [place] ([place]), [place] - [address] [place] ([place]) [place] [address] Phone: [phone] Fax: [phone]   Patient reports affordability concerns with their medications: No  Patient reports access/transportation concerns to their pharmacy: No  Patient reports adherence concerns with their medications:  No     Hypertension:  Current medications: losartan 50 mg daily, atenolol 50 mg daily   Patient has a validated, automated, upper arm home BP cuff Current blood pressure readings readings: has not been checking  Patient denies hypotensive s/sx including dizziness, lightheadedness.  Patient denies hypertensive symptoms including headache, chest pain, shortness of breath  Hyperlipidemia/ASCVD Risk Reduction  Current lipid lowering medications: Nexlizet 180/10 mg daily - Previously on atorvastatin 10 mg, 20 mg, and 40 mg with liver enzyme elevations- stopped March 2024 - Rosuvastatin 5 mg July - September; August liver enzymes were elevated AST= 42; ALT = 40  History of statin intolerance due to above liver enzyme elevations. No noted issues with myalgias (no diagnoses that would exclude from statin measure)  Denies any muscle aches/pains. Denies any symptoms of recent gout flare.   Antiplatelet regimen: aspirin 81 mg daily    Objective:  Lab Results  Component Value Date   HGBA1C 6.2 (H) 06/25/2023    Lab Results  Component Value Date   CREATININE 1.07 (H) 08/21/2023   BUN 22  08/21/2023   NA 138 08/21/2023   K 4.1 08/21/2023   CL 101 08/21/2023   CO2 24 08/21/2023    Lab Results  Component Value Date   CHOL 175 06/25/2023   HDL 81 06/25/2023   LDLCALC 83 06/25/2023   TRIG 54 06/25/2023   CHOLHDL 2.2 06/25/2023    Medications Reviewed Today     Reviewed by Alden Hipp, RPH-CPP (Pharmacist) on 09/21/23 at 1636  Med List Status: <None>   Medication Order Taking? Sig Documenting Provider Last Dose Status Informant  Ascorbic Acid (VITAMIN C) 1000 MG tablet [id number]  Take 1,000 mg by mouth daily. [provider]  Active Self  aspirin 81 MG tablet [id number]  Take 81 mg by mouth daily. [provider]  Active   atenolol (TENORMIN) 50 MG tablet [id number] Yes Take 1 tablet (50 mg total) by mouth daily. Arnette Felts, FNP Taking Active   B Complex-C (B-COMPLEX WITH VITAMIN C) tablet [id number]  Take 1 tablet by mouth daily. 100mg  [provider]  Active Self  Bempedoic Acid-Ezetimibe (NEXLIZET) 180-10 MG TABS [id number] Yes Take 1 tablet by mouth daily. Arnette Felts, FNP Taking Active   BIOTIN 5000 PO [id number]  Take 1 tablet by mouth daily.  [provider]  Active Self  cholecalciferol (VITAMIN D) 1000 UNITS tablet [id number]  Take 5,000 Units by mouth daily. [provider]  Active   Cyanocobalamin (VITAMIN B-12 PO) [id number]  Take 2,500 mg by mouth daily. [provider]  Active   losartan (COZAAR) 50 MG tablet [id number] Yes Take 1 tablet (50 mg total) by mouth daily. Arnette Felts, FNP Taking Active   Multiple Vitamin (  MULTIVITAMIN) capsule [id number]  Take 1 capsule by mouth daily. [provider]  Active               Assessment/Plan:   Hypertension: - Currently unknown control, patient has not been checking readings at home.  - Reviewed long term cardiovascular and renal outcomes of uncontrolled blood pressure - Reviewed appropriate blood pressure monitoring technique and reviewed goal  blood pressure. Recommended to check home blood pressure and heart rate periodically, document, and provide at future visits - Recommend to continue current regimen at this time. She will send MyChart message next week with home readings.    Hyperlipidemia/ASCVD Risk Reduction: - Currently tolerating regimen - Recommend to continue current regimen at this time. Lipids with next lab work    Follow Up Plan: follow up with PCP as scheduled  Catie TClearance Coots, PharmD, BCACP, CPP Clinical Pharmacist [facility] [phone]

## 2023-09-29 ENCOUNTER — Encounter: Payer: Self-pay | Admitting: Nurse Practitioner

## 2023-09-29 DIAGNOSIS — Z1231 Encounter for screening mammogram for malignant neoplasm of breast: Secondary | ICD-10-CM | POA: Diagnosis not present

## 2023-09-29 LAB — HM MAMMOGRAPHY

## 2023-09-30 ENCOUNTER — Other Ambulatory Visit (INDEPENDENT_AMBULATORY_CARE_PROVIDER_SITE_OTHER): Payer: Medicare Other | Admitting: Pharmacist

## 2023-09-30 ENCOUNTER — Encounter: Payer: Self-pay | Admitting: Nurse Practitioner

## 2023-09-30 DIAGNOSIS — I10 Essential (primary) hypertension: Secondary | ICD-10-CM

## 2023-09-30 NOTE — Progress Notes (Cosign Needed Addendum)
Care Coordination Call  Received home blood pressure readings from patient:     Average home reading 133/86. Encouraged patient to ensure she is resting for at least 5-10 minutes before checking readings.   Recommend to continue current regimen at this time.    Catie Eppie Gibson, PharmD, BCACP, CPP Clinical Pharmacist [facility] [phone]

## 2023-10-15 ENCOUNTER — Other Ambulatory Visit: Payer: Self-pay | Admitting: Internal Medicine

## 2023-12-24 ENCOUNTER — Encounter: Payer: Self-pay | Admitting: Nurse Practitioner

## 2023-12-24 ENCOUNTER — Ambulatory Visit (INDEPENDENT_AMBULATORY_CARE_PROVIDER_SITE_OTHER): Payer: Medicare Other | Admitting: Nurse Practitioner

## 2023-12-24 VITALS — BP 120/70 | HR 56 | Temp 98.7°F | Ht 63.0 in | Wt 139.2 lb

## 2023-12-24 DIAGNOSIS — Z2821 Immunization not carried out because of patient refusal: Secondary | ICD-10-CM | POA: Diagnosis not present

## 2023-12-24 DIAGNOSIS — R7309 Other abnormal glucose: Secondary | ICD-10-CM

## 2023-12-24 DIAGNOSIS — E782 Mixed hyperlipidemia: Secondary | ICD-10-CM | POA: Diagnosis not present

## 2023-12-24 DIAGNOSIS — R7989 Other specified abnormal findings of blood chemistry: Secondary | ICD-10-CM | POA: Diagnosis not present

## 2023-12-24 DIAGNOSIS — I10 Essential (primary) hypertension: Secondary | ICD-10-CM

## 2023-12-24 NOTE — Progress Notes (Signed)
 [place], CMA,acting as a Neurosurgeon for Susanna Epley, FNP.,have documented all relevant documentation on the behalf of Susanna Epley, FNP,as directed by  Susanna Epley, FNP while in the presence of Susanna Epley, FNP.  Subjective:  Patient ID: Adrienne Lopez , female    DOB: 1950/11/13 , 73 y.o.   MRN: [id number]  Chief Complaint  Patient presents with   Hypertension   Hyperlipidemia    HPI  Patient presents today for a bp and chol follow up, Patient reports compliance with medication. Patient denies any chest pain, SOB, or headaches. Patient has no concerns today.      Past Medical History:  Diagnosis Date   Cancer (HCC) 09/01/1997   breast, left   Cataract 08/2021   Does not yet require surgery   Elevated liver enzymes 10/29/2022   GERD (gastroesophageal reflux disease) 06/2021   Endoscopy 11/29/21   Hyperparathyroidism, primary (HCC)    Osteoporosis    TIA (transient ischemic attack) 12/26/2000   NO RESIDUAL PROBLEM     Family History  Problem Relation Age of Onset   Hypertension Mother    Prostate cancer Father    Diabetes Father    Heart attack Maternal Grandmother    Heart disease Maternal Grandmother    Cancer Maternal Grandfather        oral cancer     Current Outpatient Medications:    Ascorbic Acid (VITAMIN C) 1000 MG tablet, Take 1,000 mg by mouth daily., Disp: , Rfl:    aspirin  81 MG tablet, Take 81 mg by mouth daily., Disp: , Rfl:    atenolol  (TENORMIN ) 50 MG tablet, Take 1 tablet (50 mg total) by mouth daily., Disp: 90 tablet, Rfl: 3   B Complex-C (B-COMPLEX WITH VITAMIN C) tablet, Take 1 tablet by mouth daily. 100mg , Disp: , Rfl:    Bempedoic Acid -Ezetimibe (NEXLIZET ) 180-10 MG TABS, Take 1 tablet by mouth daily., Disp: 90 tablet, Rfl: 3   BIOTIN 5000 PO, Take 1 tablet by mouth daily. , Disp: , Rfl:    cholecalciferol (VITAMIN D) 1000 UNITS tablet, Take 5,000 Units by mouth daily., Disp: , Rfl:    Cyanocobalamin (VITAMIN B-12 PO), Take 2,500 mg by mouth  daily., Disp: , Rfl:    losartan  (COZAAR ) 50 MG tablet, Take 1 tablet (50 mg total) by mouth daily., Disp: 90 tablet, Rfl: 1   No Known Allergies   Review of Systems  Constitutional: Negative.   Respiratory: Negative.    Cardiovascular: Negative.   Gastrointestinal: Negative.   Musculoskeletal: Negative.   Neurological: Negative.   Psychiatric/Behavioral: Negative.       Today's Vitals   12/24/23 1104  BP: 120/70  Pulse: (!) 56  Temp: 98.7 F (37.1 C)  TempSrc: Oral  Weight: 139 lb 3.2 oz (63.1 kg)  Height: 5\' 3"  (1.6 m)  PainSc: 0-No pain   Body mass index is 24.66 kg/m.  Wt Readings from Last 3 Encounters:  12/24/23 139 lb 3.2 oz (63.1 kg)  06/26/23 137 lb 6.4 oz (62.3 kg)  06/25/23 134 lb (60.8 kg)     Objective:  Physical Exam Vitals and nursing note reviewed.  Constitutional:      General: She is not in acute distress.    Appearance: Normal appearance.  Cardiovascular:     Rate and Rhythm: Normal rate and regular rhythm.     Pulses: Normal pulses.     Heart sounds: Normal heart sounds. No murmur heard. Pulmonary:     Effort: Pulmonary effort is  normal. No respiratory distress.     Breath sounds: Normal breath sounds. No wheezing.  Skin:    General: Skin is warm and dry.     Capillary Refill: Capillary refill takes less than 2 seconds.  Neurological:     General: No focal deficit present.     Mental Status: She is alert and oriented to person, place, and time.     Cranial Nerves: No cranial nerve deficit.     Motor: No weakness.  Psychiatric:        Mood and Affect: Mood normal.        Behavior: Behavior normal.        Thought Content: Thought content normal.        Judgment: Judgment normal.         Assessment And Plan:  Essential hypertension Assessment & Plan: Blood pressure is well controlled. Continue current medications  Orders: -     BMP8+eGFR -     Microalbumin / creatinine urine ratio  Mixed hyperlipidemia Assessment &  Plan: Cholesterol levels were normal at last visit, continue nexlitol. Will check liver functions  Orders: -     BMP8+eGFR -     Lipid panel  Abnormal glucose Assessment & Plan: HgbA1c is stable. Continue focusing on healthy diet and regular exercise  Orders: -     Hemoglobin A1c  COVID-19 vaccination declined Assessment & Plan: Declines covid 19 vaccine. Discussed risk of covid 52 and if she changes her mind about the vaccine to call the office. Education has been provided regarding the importance of this vaccine but patient still declined. Advised may receive this vaccine at local pharmacy or Health Dept.or vaccine clinic. Aware to provide a copy of the vaccination record if obtained from local pharmacy or Health Dept.  Encouraged to take multivitamin, vitamin d, vitamin c and zinc to increase immune system. Aware can call office if would like to have vaccine here at office. Verbalized acceptance and understanding.      Return for keep same next.  Patient was given opportunity to ask questions. Patient verbalized understanding of the plan and was able to repeat key elements of the plan. All questions were answered to their satisfaction.    Inge Mangle, FNP, have reviewed all documentation for this visit. The documentation on 12/24/23 for the exam, diagnosis, procedures, and orders are all accurate and complete.   IF YOU HAVE BEEN REFERRED TO A SPECIALIST, IT MAY TAKE 1-2 WEEKS TO SCHEDULE/PROCESS THE REFERRAL. IF YOU HAVE NOT HEARD FROM US /SPECIALIST IN TWO WEEKS, PLEASE GIVE US  A CALL AT [phone] X [phone].

## 2023-12-24 NOTE — Assessment & Plan Note (Signed)
 Blood pressure is well controlled. Continue current medications.

## 2023-12-24 NOTE — Assessment & Plan Note (Signed)
HgbA1c is stable. Continue focusing on healthy diet and regular exercise.

## 2023-12-24 NOTE — Assessment & Plan Note (Signed)

## 2023-12-24 NOTE — Assessment & Plan Note (Signed)
 Cholesterol levels were normal at last visit, continue nexlitol. Will check liver functions

## 2023-12-25 LAB — BMP8+EGFR
BUN/Creatinine Ratio: 25 (ref 12–28)
BUN: 28 mg/dL — ABNORMAL HIGH (ref 8–27)
CO2: 25 mmol/L (ref 20–29)
Calcium: 9.6 mg/dL (ref 8.7–10.3)
Chloride: 104 mmol/L (ref 96–106)
Creatinine, Ser: 1.1 mg/dL — ABNORMAL HIGH (ref 0.57–1.00)
Glucose: 71 mg/dL (ref 70–99)
Potassium: 4.6 mmol/L (ref 3.5–5.2)
Sodium: 142 mmol/L (ref 134–144)
eGFR: 53 mL/min/{1.73_m2} — ABNORMAL LOW (ref >59)

## 2023-12-25 LAB — LIPID PANEL
Chol/HDL Ratio: 2 ratio (ref 0.0–4.4)
Cholesterol, Total: 152 mg/dL (ref 100–199)
HDL: 76 mg/dL (ref >39)
LDL Chol Calc (NIH): 67 mg/dL (ref 0–99)
Triglycerides: 40 mg/dL (ref 0–149)
VLDL Cholesterol Cal: 9 mg/dL (ref 5–40)

## 2023-12-25 LAB — MICROALBUMIN / CREATININE URINE RATIO
Creatinine, Urine: 21.4 mg/dL
Microalb/Creat Ratio: 24 mg/g{creat} (ref 0–29)
Microalbumin, Urine: 5.2 ug/mL

## 2023-12-25 LAB — HEMOGLOBIN A1C
Est. average glucose Bld gHb Est-mCnc: 126 mg/dL
Hgb A1c MFr Bld: 6 % — ABNORMAL HIGH (ref 4.8–5.6)

## 2023-12-27 ENCOUNTER — Encounter: Payer: Self-pay | Admitting: Nurse Practitioner

## 2023-12-30 ENCOUNTER — Encounter: Payer: Self-pay | Admitting: Nurse Practitioner

## 2024-01-02 LAB — HEPATIC FUNCTION PANEL
ALT: 19 IU/L (ref 0–32)
AST: 25 IU/L (ref 0–40)
Albumin: 4.3 g/dL (ref 3.8–4.8)
Alkaline Phosphatase: 53 IU/L (ref 44–121)
Bilirubin Total: <0.2 mg/dL (ref 0.0–1.2)
Bilirubin, Direct: 0.09 mg/dL (ref 0.00–0.40)
Total Protein: 7.7 g/dL (ref 6.0–8.5)

## 2024-01-02 LAB — SPECIMEN STATUS REPORT

## 2024-01-15 ENCOUNTER — Other Ambulatory Visit: Payer: Self-pay | Admitting: Nurse Practitioner

## 2024-04-14 ENCOUNTER — Other Ambulatory Visit: Payer: Self-pay | Admitting: Nurse Practitioner

## 2024-05-09 ENCOUNTER — Encounter: Payer: Self-pay | Admitting: Nurse Practitioner

## 2024-05-09 ENCOUNTER — Other Ambulatory Visit: Payer: Self-pay

## 2024-05-09 ENCOUNTER — Ambulatory Visit (INDEPENDENT_AMBULATORY_CARE_PROVIDER_SITE_OTHER): Payer: Medicare Other | Admitting: Nurse Practitioner

## 2024-05-09 VITALS — BP 110/80 | HR 85 | Temp 97.8°F | Ht 63.0 in | Wt 138.0 lb

## 2024-05-09 DIAGNOSIS — R319 Hematuria, unspecified: Secondary | ICD-10-CM

## 2024-05-09 DIAGNOSIS — M810 Age-related osteoporosis without current pathological fracture: Secondary | ICD-10-CM | POA: Diagnosis not present

## 2024-05-09 DIAGNOSIS — Z0000 Encounter for general adult medical examination without abnormal findings: Secondary | ICD-10-CM

## 2024-05-09 DIAGNOSIS — Z8739 Personal history of other diseases of the musculoskeletal system and connective tissue: Secondary | ICD-10-CM

## 2024-05-09 DIAGNOSIS — E782 Mixed hyperlipidemia: Secondary | ICD-10-CM | POA: Diagnosis not present

## 2024-05-09 DIAGNOSIS — Z2821 Immunization not carried out because of patient refusal: Secondary | ICD-10-CM

## 2024-05-09 DIAGNOSIS — Z79899 Other long term (current) drug therapy: Secondary | ICD-10-CM | POA: Diagnosis not present

## 2024-05-09 DIAGNOSIS — I10 Essential (primary) hypertension: Secondary | ICD-10-CM

## 2024-05-09 DIAGNOSIS — R7309 Other abnormal glucose: Secondary | ICD-10-CM

## 2024-05-09 LAB — POCT URINALYSIS DIP (CLINITEK)
Bilirubin, UA: NEGATIVE
Glucose, UA: NEGATIVE mg/dL
Ketones, POC UA: NEGATIVE mg/dL
Leukocytes, UA: NEGATIVE
Nitrite, UA: NEGATIVE
POC PROTEIN,UA: NEGATIVE
Spec Grav, UA: 1.015 (ref 1.010–1.025)
Urobilinogen, UA: 0.2 U/dL
pH, UA: 7 (ref 5.0–8.0)

## 2024-05-09 MED ORDER — LOSARTAN POTASSIUM 50 MG PO TABS: 50.0000 mg | ORAL_TABLET | Freq: Every day | ORAL | 1 refills | Status: DC

## 2024-05-09 MED ORDER — LOSARTAN POTASSIUM 50 MG PO TABS: 50.0000 mg | ORAL_TABLET | Freq: Every day | ORAL | 3 refills | Status: AC

## 2024-05-09 NOTE — Assessment & Plan Note (Signed)
 Trace blood in urine, will send for urine culture.

## 2024-05-09 NOTE — Progress Notes (Signed)
 [name], CMA,acting as a Neurosurgeon for Gaines Ada, FNP.,have documented all relevant documentation on the behalf of Gaines Ada, FNP,as directed by  Gaines Ada, FNP while in the presence of Gaines Ada, FNP.  Subjective:    Patient ID: Adrienne Lopez , female    DOB: [birth year] , 73 y.o.   MRN: [id number]  Chief Complaint  Patient presents with   Annual Exam    Patient presents today for HM, Patient reports compliance with medication. Patient denies any chest pain, SOB, or headaches. Patient has no concerns today. Patient would like to get EKG done on Monday at cardiology.      Patient presents today for htn follow up. Patient has no other complaints or concerns. Patient does not have any questions or concerns at this time. She has not taken her blood pressure medications today  Hypertension This is a chronic problem. The current episode started more than 1 year ago. The problem is unchanged. The problem is controlled. Pertinent negatives include no anxiety. There are no associated agents to hypertension. Risk factors for coronary artery disease include sedentary lifestyle. Past treatments include beta blockers and angiotensin blockers. Compliance problems include exercise.  There is no history of angina or kidney disease. There is no history of chronic renal disease.     Discussed the use of AI scribe software for clinical note transcription with the patient, who gave verbal consent to proceed.  History of Present Illness Adrienne Lopez is a 73 year old female who presents for an annual physical exam.  She is currently taking atenolol  50 mg once daily and losartan  50 mg once daily for hypertension. Her losartan  prescription requires authorization for refills. She has not missed any doses.  She has a history of osteoporosis and last had a bone density scan in October 2021.  She experienced a gout attack last year but has not had any flares since then.  She maintains a healthy diet,  avoiding fried and processed foods, and does not follow a specific diet plan. She is not currently exercising regularly.  No current health concerns. No issues with swallowing, swelling in her feet or ankles, or any recent doctor visits since her last appointment in April. She plans to get a COVID shot next month but is unsure if a prescription is needed.   Past Medical History:  Diagnosis Date   Cancer (HCC) 09/01/1997   breast, left   Cataract 08/2021   Does not yet require surgery   Elevated liver enzymes 10/29/2022   GERD (gastroesophageal reflux disease) 06/2021   Endoscopy 11/29/21   Hyperparathyroidism, primary (HCC)    Hypertension 06/2019   Osteoporosis    TIA (transient ischemic attack) 12/26/2000   NO RESIDUAL PROBLEM     Family History  Problem Relation Age of Onset   Hypertension Mother    Prostate cancer Father    Diabetes Father    Heart attack Maternal Grandmother    Heart disease Maternal Grandmother    Cancer Maternal Grandfather        oral cancer     Current Outpatient Medications:    Ascorbic Acid (VITAMIN C) 1000 MG tablet, Take 1,000 mg by mouth daily., Disp: , Rfl:    aspirin  81 MG tablet, Take 81 mg by mouth daily., Disp: , Rfl:    atenolol  (TENORMIN ) 50 MG tablet, Take 1 tablet (50 mg total) by mouth daily., Disp: 90 tablet, Rfl: 3   B Complex-C (B-COMPLEX WITH VITAMIN C) tablet, Take 1  tablet by mouth daily. 100mg , Disp: , Rfl:    Bempedoic Acid -Ezetimibe (NEXLIZET ) 180-10 MG TABS, Take 1 tablet by mouth daily., Disp: 90 tablet, Rfl: 3   BIOTIN 5000 PO, Take 1 tablet by mouth daily. , Disp: , Rfl:    cholecalciferol (VITAMIN D) 1000 UNITS tablet, Take 5,000 Units by mouth daily., Disp: , Rfl:    Cyanocobalamin (VITAMIN B-12 PO), Take 2,500 mg by mouth daily., Disp: , Rfl:    losartan  (COZAAR ) 50 MG tablet, Take 1 tablet (50 mg total) by mouth daily., Disp: 90 tablet, Rfl: 3   No Known Allergies    The patient states she uses status post  hysterectomy for birth control. No LMP recorded. Patient has had a hysterectomy.   Negative for: breast discharge, breast lump(s), breast pain and breast self exam. Associated symptoms include abnormal vaginal bleeding. Pertinent negatives include abnormal bleeding (hematology), anxiety, decreased libido, depression, difficulty falling sleep, dyspareunia, history of infertility, nocturia, sexual dysfunction, sleep disturbances, urinary incontinence, urinary urgency, vaginal discharge and vaginal itching. Diet regular; healthy. The patient states her exercise level is minimal.   The patient's tobacco use is:  Social History   Tobacco Use  Smoking Status Never  Smokeless Tobacco Never   She has been exposed to passive smoke. The patient's alcohol use is:  Social History   Substance and Sexual Activity  Alcohol Use Never    Review of Systems  Constitutional: Negative.   HENT: Negative.    Eyes: Negative.   Respiratory: Negative.    Cardiovascular: Negative.   Gastrointestinal: Negative.   Endocrine: Negative.   Genitourinary: Negative.   Musculoskeletal: Negative.   Skin: Negative.   Allergic/Immunologic: Negative.   Neurological: Negative.   Hematological: Negative.   Psychiatric/Behavioral: Negative.       Today's Vitals   05/09/24 0959  BP: 110/80  Pulse: 85  Temp: 97.8 F (36.6 C)  TempSrc: Oral  Weight: 138 lb (62.6 kg)  Height: 5' 3 (1.6 m)  PainSc: 0-No pain   Body mass index is 24.45 kg/m.  Wt Readings from Last 3 Encounters:  05/09/24 138 lb (62.6 kg)  12/24/23 139 lb 3.2 oz (63.1 kg)  06/26/23 137 lb 6.4 oz (62.3 kg)     Objective:  Physical Exam Vitals and nursing note reviewed.  Constitutional:      General: She is not in acute distress.    Appearance: Normal appearance. She is well-developed.  HENT:     Head: Normocephalic and atraumatic.     Right Ear: Hearing, tympanic membrane and external ear normal. There is no impacted cerumen.     Left  Ear: Hearing, tympanic membrane, ear canal and external ear normal. There is no impacted cerumen.     Nose: Nose normal.     Mouth/Throat:     Mouth: Mucous membranes are moist.  Eyes:     General: Lids are normal.     Extraocular Movements: Extraocular movements intact.     Conjunctiva/sclera: Conjunctivae normal.     Pupils: Pupils are equal, round, and reactive to light.     Funduscopic exam:    Right eye: No papilledema.        Left eye: No papilledema.  Neck:     Thyroid : No thyroid  mass.     Vascular: No carotid bruit.  Cardiovascular:     Rate and Rhythm: Normal rate and regular rhythm.     Pulses: Normal pulses.     Heart sounds: Normal heart sounds. No murmur  heard. Pulmonary:     Effort: Pulmonary effort is normal. No respiratory distress.     Breath sounds: Normal breath sounds. No wheezing.  Chest:     Chest wall: No mass.  Breasts:    Tanner Score is 5.     Right: Normal. No mass or tenderness.     Left: Normal. No mass or tenderness.  Abdominal:     General: Abdomen is flat. Bowel sounds are normal. There is no distension.     Palpations: Abdomen is soft.     Tenderness: There is no abdominal tenderness.  Genitourinary:    Comments: Hysterectomy Musculoskeletal:        General: No swelling. Normal range of motion.     Cervical back: Full passive range of motion without pain, normal range of motion and neck supple.     Right lower leg: No edema.     Left lower leg: No edema.  Lymphadenopathy:     Upper Body:     Right upper body: No supraclavicular, axillary or pectoral adenopathy.     Left upper body: No supraclavicular, axillary or pectoral adenopathy.  Skin:    General: Skin is warm and dry.     Capillary Refill: Capillary refill takes less than 2 seconds.  Neurological:     General: No focal deficit present.     Mental Status: She is alert and oriented to person, place, and time.     Cranial Nerves: No cranial nerve deficit.     Sensory: No sensory  deficit.     Motor: No weakness.  Psychiatric:        Mood and Affect: Mood normal.        Behavior: Behavior normal.        Thought Content: Thought content normal.        Judgment: Judgment normal.     Assessment And Plan:     Encounter for annual health examination Assessment & Plan: BMI normal at 24.45. Blood pressure well-controlled at 110/80 mmHg. - Discussed healthy diet and regular exercise. - Encouraged to maintain BMI under 25. - Discussed importance of regular dental visits.   Essential hypertension Assessment & Plan: Hypertension well-controlled with current medication. Blood pressure 110/80 mmHg. - Continue atenolol  50 mg oral daily. - Continue losartan  50 mg oral daily. - Refill losartan  prescription. - EKG not done at the request of patient since she is seeing her Cardiologist next week.   Orders: -     POCT URINALYSIS DIP (CLINITEK) -     CMP14+EGFR; Future -     Microalbumin / creatinine urine ratio; Future -     Losartan  Potassium; Take 1 tablet (50 mg total) by mouth daily.  Dispense: 90 tablet; Refill: 3  Mixed hyperlipidemia Assessment & Plan: Cholesterol levels were normal at last visit, continue nexlitol. Will check liver functions and lipid panel, will return on Wednesday  Orders: -     Lipid panel; Future  Abnormal glucose Assessment & Plan: HgbA1c is stable. Continue focusing on healthy diet and regular exercise  Orders: -     Hemoglobin A1c; Future  COVID-19 vaccination declined Assessment & Plan: She plans to get next month at [facility]  - If needed, provide prescription for COVID-19 vaccine.   Influenza vaccination declined Assessment & Plan: Patient declined influenza vaccination at this time. Patient is aware that influenza vaccine prevents illness in 70% of healthy people, and reduces hospitalizations to 30-70% in elderly. This vaccine is recommended annually. Education has been  provided regarding the importance of this vaccine but  patient still declined. Advised may receive this vaccine at local pharmacy or Health Dept.or vaccine clinic. Aware to provide a copy of the vaccination record if obtained from local pharmacy or Health Dept.  Pt is willing to accept risk associated with refusing vaccination.    Hematuria, unspecified type Assessment & Plan: Trace blood in urine, will send for urine culture.   Orders: -     Urine Culture; Future  Other long term (current) drug therapy -     CBC with Differential/Platelet; Future  Age-related osteoporosis without current pathological fracture Assessment & Plan: Osteoporosis monitored. Last bone density scan in March 2021, due for repeat. - Order bone density scan.  Orders: -     DG Bone Density; Future  History of gout -     Uric acid; Future    Return for 1 year physical, 6 month bp check; labs on Wednesday. Patient was given opportunity to ask questions. Patient verbalized understanding of the plan and was able to repeat key elements of the plan. All questions were answered to their satisfaction.   Gaines Ada, FNP  I, Gaines Ada, FNP, have reviewed all documentation for this visit. The documentation on 05/09/24 for the exam, diagnosis, procedures, and orders are all accurate and complete.

## 2024-05-09 NOTE — Assessment & Plan Note (Signed)
 She plans to get next month at [facility]  - If needed, provide prescription for COVID-19 vaccine.

## 2024-05-09 NOTE — Assessment & Plan Note (Signed)
HgbA1c is stable. Continue focusing on healthy diet and regular exercise.

## 2024-05-09 NOTE — Assessment & Plan Note (Signed)
 Osteoporosis monitored. Last bone density scan in March 2021, due for repeat. - Order bone density scan.

## 2024-05-09 NOTE — Assessment & Plan Note (Addendum)
 Cholesterol levels were normal at last visit, continue nexlitol. Will check liver functions and lipid panel, will return on Wednesday

## 2024-05-09 NOTE — Patient Instructions (Signed)
 Health Maintenance  Topic Date Due   Medicare Annual Wellness Visit  06/24/2024   COVID-19 Vaccine (7 - 2025-26 season) 05/25/2024*   Flu Shot  11/29/2024*   Mammogram  09/28/2024   Colon Cancer Screening  09/06/2028   DTaP/Tdap/Td vaccine (2 - Td or Tdap) 05/18/2030   Pneumococcal Vaccine for age over 74  Completed   DEXA scan (bone density measurement)  Completed   Hepatitis C Screening  Completed   Zoster (Shingles) Vaccine  Completed   HPV Vaccine  Aged Out   Meningitis B Vaccine  Aged Out  *Topic was postponed. The date shown is not the original due date.

## 2024-05-09 NOTE — Assessment & Plan Note (Signed)
 Hypertension well-controlled with current medication. Blood pressure 110/80 mmHg. - Continue atenolol  50 mg oral daily. - Continue losartan  50 mg oral daily. - Refill losartan  prescription. - EKG not done at the request of patient since she is seeing her Cardiologist next week.

## 2024-05-09 NOTE — Assessment & Plan Note (Signed)

## 2024-05-09 NOTE — Assessment & Plan Note (Signed)
 BMI normal at 24.45. Blood pressure well-controlled at 110/80 mmHg. - Discussed healthy diet and regular exercise. - Encouraged to maintain BMI under 25. - Discussed importance of regular dental visits.

## 2024-05-10 LAB — MICROALBUMIN / CREATININE URINE RATIO
Creatinine, Urine: 72.7 mg/dL
Microalb/Creat Ratio: 10 mg/g{creat} (ref 0–29)
Microalbumin, Urine: 7 ug/mL

## 2024-05-11 ENCOUNTER — Other Ambulatory Visit

## 2024-05-11 ENCOUNTER — Other Ambulatory Visit: Payer: Self-pay

## 2024-05-11 DIAGNOSIS — R7309 Other abnormal glucose: Secondary | ICD-10-CM | POA: Diagnosis not present

## 2024-05-11 DIAGNOSIS — I10 Essential (primary) hypertension: Secondary | ICD-10-CM

## 2024-05-11 DIAGNOSIS — E782 Mixed hyperlipidemia: Secondary | ICD-10-CM | POA: Diagnosis not present

## 2024-05-11 DIAGNOSIS — N183 Chronic kidney disease, stage 3 unspecified: Secondary | ICD-10-CM | POA: Diagnosis not present

## 2024-05-11 DIAGNOSIS — Z79899 Other long term (current) drug therapy: Secondary | ICD-10-CM

## 2024-05-11 DIAGNOSIS — Z8739 Personal history of other diseases of the musculoskeletal system and connective tissue: Secondary | ICD-10-CM | POA: Diagnosis not present

## 2024-05-11 LAB — CMP14+EGFR
ALT: 20 IU/L (ref 0–32)
AST: 27 IU/L (ref 0–40)
Albumin: 4.5 g/dL (ref 3.8–4.8)
Alkaline Phosphatase: 61 IU/L (ref 44–121)
BUN/Creatinine Ratio: 30 — ABNORMAL HIGH (ref 12–28)
BUN: 30 mg/dL — ABNORMAL HIGH (ref 8–27)
Bilirubin Total: 0.6 mg/dL (ref 0.0–1.2)
CO2: 23 mmol/L (ref 20–29)
Calcium: 10 mg/dL (ref 8.7–10.3)
Chloride: 101 mmol/L (ref 96–106)
Creatinine, Ser: 0.99 mg/dL (ref 0.57–1.00)
Globulin, Total: 3.8 g/dL (ref 1.5–4.5)
Glucose: 81 mg/dL (ref 70–99)
Potassium: 4 mmol/L (ref 3.5–5.2)
Sodium: 140 mmol/L (ref 134–144)
Total Protein: 8.3 g/dL (ref 6.0–8.5)
eGFR: 59 mL/min/1.73 — ABNORMAL LOW (ref >59)

## 2024-05-11 LAB — CBC WITH DIFFERENTIAL/PLATELET
Basophils Absolute: 0 x10E3/uL (ref 0.0–0.2)
Basos: 0 %
EOS (ABSOLUTE): 0 x10E3/uL (ref 0.0–0.4)
Eos: 1 %
Hematocrit: 43.8 % (ref 34.0–46.6)
Hemoglobin: 13.9 g/dL (ref 11.1–15.9)
Immature Grans (Abs): 0 x10E3/uL (ref 0.0–0.1)
Immature Granulocytes: 0 %
Lymphocytes Absolute: 1.4 x10E3/uL (ref 0.7–3.1)
Lymphs: 28 %
MCH: 31 pg (ref 26.6–33.0)
MCHC: 31.7 g/dL (ref 31.5–35.7)
MCV: 98 fL — ABNORMAL HIGH (ref 79–97)
Monocytes Absolute: 0.4 x10E3/uL (ref 0.1–0.9)
Monocytes: 8 %
Neutrophils Absolute: 3.1 x10E3/uL (ref 1.4–7.0)
Neutrophils: 63 %
Platelets: 152 x10E3/uL (ref 150–450)
RBC: 4.48 x10E6/uL (ref 3.77–5.28)
RDW: 14.1 % (ref 11.7–15.4)
WBC: 5 x10E3/uL (ref 3.4–10.8)

## 2024-05-11 LAB — LIPID PANEL
Chol/HDL Ratio: 2 ratio (ref 0.0–4.4)
Cholesterol, Total: 143 mg/dL (ref 100–199)
HDL: 72 mg/dL (ref >39)
LDL Chol Calc (NIH): 59 mg/dL (ref 0–99)
Triglycerides: 53 mg/dL (ref 0–149)
VLDL Cholesterol Cal: 12 mg/dL (ref 5–40)

## 2024-05-11 LAB — URINE CULTURE

## 2024-05-11 LAB — HEMOGLOBIN A1C
Est. average glucose Bld gHb Est-mCnc: 117 mg/dL
Hgb A1c MFr Bld: 5.7 % — ABNORMAL HIGH (ref 4.8–5.6)

## 2024-05-12 LAB — URIC ACID: Uric Acid: 8.1 mg/dL — ABNORMAL HIGH (ref 3.1–7.9)

## 2024-05-16 ENCOUNTER — Encounter: Payer: Self-pay | Admitting: Internal Medicine

## 2024-05-16 ENCOUNTER — Ambulatory Visit: Attending: Internal Medicine | Admitting: Internal Medicine

## 2024-05-16 ENCOUNTER — Ambulatory Visit: Admitting: Family Medicine

## 2024-05-16 ENCOUNTER — Encounter: Payer: Self-pay | Admitting: Family Medicine

## 2024-05-16 ENCOUNTER — Other Ambulatory Visit: Payer: Self-pay

## 2024-05-16 ENCOUNTER — Other Ambulatory Visit (HOSPITAL_COMMUNITY)
Admission: RE | Admit: 2024-05-16 | Discharge: 2024-05-16 | Disposition: A | Discharge status: Home / Self Care | Source: Ambulatory Visit | Attending: Family Medicine | Admitting: Family Medicine

## 2024-05-16 VITALS — BP 170/80 | HR 55 | Ht 63.5 in | Wt 137.0 lb

## 2024-05-16 VITALS — BP 169/88 | HR 99 | Wt 134.9 lb

## 2024-05-16 DIAGNOSIS — E785 Hyperlipidemia, unspecified: Secondary | ICD-10-CM

## 2024-05-16 DIAGNOSIS — Z8739 Personal history of other diseases of the musculoskeletal system and connective tissue: Secondary | ICD-10-CM | POA: Diagnosis not present

## 2024-05-16 DIAGNOSIS — I491 Atrial premature depolarization: Secondary | ICD-10-CM

## 2024-05-16 DIAGNOSIS — I10 Essential (primary) hypertension: Secondary | ICD-10-CM

## 2024-05-16 DIAGNOSIS — N9089 Other specified noninflammatory disorders of vulva and perineum: Secondary | ICD-10-CM

## 2024-05-16 DIAGNOSIS — I493 Ventricular premature depolarization: Secondary | ICD-10-CM | POA: Diagnosis not present

## 2024-05-16 LAB — PE+INTERP(RFX IFE+FLC), S
A/G Ratio: 0.9 (ref 0.7–1.7)
Albumin ELP: 3.9 g/dL (ref 2.9–4.4)
Alpha 1: 0.2 g/dL (ref 0.0–0.4)
Alpha 2: 0.7 g/dL (ref 0.4–1.0)
Beta: 1 g/dL (ref 0.7–1.3)
Gamma Globulin: 2.4 g/dL — ABNORMAL HIGH (ref 0.4–1.8)
Globulin, Total: 4.3 g/dL — ABNORMAL HIGH (ref 2.2–3.9)
Total Protein: 8.2 g/dL (ref 6.0–8.5)

## 2024-05-16 LAB — SPECIMEN STATUS REPORT

## 2024-05-16 NOTE — Progress Notes (Signed)
   Subjective:    Patient ID: Adrienne Lopez is a 73 y.o. female presenting with Vagina Abscess   on 05/16/2024  HPI: Has h/o ingrown hair and had an I and D years ago. Was firm and then did not bother her for 20 years. She noted some increased soreness and pain and erythema. She had a bump that remained and then fell off. The bump appeared black.  Review of Systems  Constitutional:  Negative for chills and fever.  Respiratory:  Negative for shortness of breath.   Cardiovascular:  Negative for chest pain.  Gastrointestinal:  Negative for abdominal pain, nausea and vomiting.  Genitourinary:  Positive for vaginal pain. Negative for dysuria.  Skin:  Negative for rash.      Objective:    BP (!) 188/88   Pulse 99   Wt 134 lb 14.4 oz (61.2 kg)   BMI 23.90 kg/m  Physical Exam Exam conducted with a chaperone present.  Constitutional:      General: She is not in acute distress.    Appearance: She is well-developed.  HENT:     Head: Normocephalic and atraumatic.  Eyes:     General: No scleral icterus. Cardiovascular:     Rate and Rhythm: Normal rate.  Pulmonary:     Effort: Pulmonary effort is normal.  Abdominal:     Palpations: Abdomen is soft.  Genitourinary:    Comments: There is a a small area of pink skin with gray ulcer in the middle. No drainage noted. Musculoskeletal:     Cervical back: Neck supple.  Skin:    General: Skin is warm and dry.  Neurological:     Mental Status: She is alert and oriented to person, place, and time.    Procedure: Patient identified, informed consent signed, copy in chart, time out performed.    Area cleansed with Alcohol.  Injected with 1% Lidocaine  with Epi.  3 mL. Area cleaned with Betadine and 3 mm punch biopsy performed without difficulty.  Hemostasis obtained with Silver Nitrate.  Patient tolerated procedure well.       Assessment & Plan:   Problem List Items Addressed This Visit       Unprioritized   Essential hypertension    BP is high in the office today. Seeing another MD later today for recheck. Was normal with PCP last week.      Other Visit Diagnoses       Vulvar lesion    -  Primary   will obtain biopsy to r/o any pathology that would need f/u.   Relevant Orders   Surgical pathology( [place]/ [facility])        Return if symptoms worsen or fail to improve.  Glenys GORMAN Birk, MD 05/16/2024 9:02 AM

## 2024-05-16 NOTE — Assessment & Plan Note (Signed)
 BP is high in the office today. Seeing another MD later today for recheck. Was normal with PCP last week.

## 2024-05-16 NOTE — Patient Instructions (Signed)
 Medication Instructions:  NO CHANGES  Please send a log of your home BP readings in 7-10 days  *If you need a refill on your cardiac medications before your next appointment, please call your pharmacy*  Follow-Up: At [facility], you and your health needs are our priority.  As part of our continuing mission to provide you with exceptional heart care, our providers are all part of one team.  This team includes your primary Cardiologist (physician) and Advanced Practice Providers or APPs (Physician Assistants and Nurse Practitioners) who all work together to provide you with the care you need, when you need it.  Your next appointment:    6 months with NP or PA  We recommend signing up for the patient portal called MyChart.  Sign up information is provided on this After Visit Summary.  MyChart is used to connect with patients for Virtual Visits (Telemedicine).  Patients are able to view lab/test results, encounter notes, upcoming appointments, etc.  Non-urgent messages can be sent to your provider as well.   To learn more about what you can do with MyChart, go to [email or web address].   Other Instructions  Tips to Measure your Blood Pressure Correctly  To determine whether you have hypertension, a medical professional will take a blood pressure reading. How you prepare for the test, the position of your arm, and other factors can change a blood pressure reading by 10% or more. That could be enough to hide high blood pressure, start you on a drug you don't really need, or lead your doctor to incorrectly adjust your medications.  National and international guidelines offer specific instructions for measuring blood pressure. If a doctor, nurse, or medical assistant isn't doing it right, don't hesitate to ask him or her to get with the guidelines.  Here's what you can do to ensure a correct reading:  Don't drink a caffeinated beverage or smoke during the 30 minutes before the  test.  Sit quietly for five minutes before the test begins.  During the measurement, sit in a chair with your feet on the floor and your arm supported so your elbow is at about heart level.  The inflatable part of the cuff should completely cover at least 80% of your upper arm, and the cuff should be placed on bare skin, not over a shirt.  Don't talk during the measurement.  Have your blood pressure measured twice, with a brief break in between. If the readings are different by 5 points or more, have it done a third time.  In 2017, new guidelines from the American Heart Association, the [employer] of Cardiology, and nine other health organizations lowered the diagnosis of high blood pressure to 130/80 mm Hg or higher for all adults. The guidelines also redefined the various blood pressure categories to now include normal, elevated, Stage 1 hypertension, Stage 2 hypertension, and hypertensive crisis (see Blood pressure categories).  Blood pressure categories  Blood pressure category SYSTOLIC (upper number)  DIASTOLIC (lower number)  Normal Less than 120 mm Hg and Less than 80 mm Hg  Elevated 120-129 mm Hg and Less than 80 mm Hg  High blood pressure: Stage 1 hypertension 130-139 mm Hg or 80-89 mm Hg  High blood pressure: Stage 2 hypertension 140 mm Hg or higher or 90 mm Hg or higher  Hypertensive crisis (consult your doctor immediately) Higher than 180 mm Hg and/or Higher than 120 mm Hg  Source: American Heart Association and American Stroke Association. For more on getting  your blood pressure under control, buy Controlling Your Blood Pressure, a Special Health Report from [facility].   Blood Pressure Log   Date   Time  Blood Pressure  Position  Example: Nov 1 9 AM 124/78 sitting

## 2024-05-16 NOTE — Progress Notes (Signed)
 OFFICE NOTE  Chief Complaint:  Follow-up  Primary Care Physician: Georgina Speaks, FNP  HPI:  Adrienne Lopez is a pleasant 73 year old female from [place]. She was noted to be hypercalcemic and ultimately diagnosed with hyperparathyroidism. She underwent parathyroid  surgery by Dr. Eletha in September. She did well since then however subsequently developed palpitations. She describes the palpitations as a hard thumping in her chest and neck were occurring several times a day after surgery. The symptoms have improved somewhat and it seems to be coincidental with her taking aspirin .  She does have a history of TIA in the past, but the episode was unclear as she blacked out when she was taking medication for breast cancer, specifically tamoxifen. She was taken off of that at that time. She did have a history of breast cancer and underwent chest wall radiation in 1999 but has been free of recurrence. Otherwise she has no other medical history and is currently not on any medications other than aspirin . He recently had blood work through Dr. Orlinda office which indicates her thyroid  function is low normal but her parathyroid  is within normal limits and her calcium  is now 9.3. She reports her palpitations occur a couple times a day and feel like a strong beating of the heart that causes her some mild shortness of breath. She denies any chest pain or anginal symptoms. There is no significant family history of heart disease.   At her last visit I recommended an echocardiogram and 48 hour monitor. She did wear the monitor for 48 hours which reported actually a number of both ventricular and atrial ectopic beats. Infectious thousand and 26 bigeminal ventricular beats. There was no significant nonsustained VT but there was some sustained supraventricular tachycardia.  She underwent an echocardiogram which was essentially normal except for some mitral regurgitation, but no wall motion abnormalities and preserved  EF of 55-60%.  Subsequent to this, she had an episode of chest pain which awoke her up at night on November 7. She went to the emergency department with symptoms of chest tightness and left arm pain. The symptoms resolved fairly quickly and she ruled out for MI. She did report some palpitations associated with this event.  In addition to her parathyroid  problems, there is a question about thyroid  abnormality as well. She is currently undergoing testing for this. She says all of these symptoms have come on since her surgery for the parathyroid  glands.  Adrienne Lopez returns today and reports feeling well. She continues to have infrequent palpitations, despite atenolol .  She denies any chest pain or worsening shortness of breath. She's had no further TIA events. She's been compliant with daily aspirin .  12/02/2016  Adrienne Lopez returns for follow-up. Over the past year she has done well without complaints. She denies any palpitations. She followed up with Dr. Tommas and was noted to have elevated blood sugars - she was told to lose 10 pounds and she was able to successfully. BP was mildly elevated today, but came down to 134/86 after a re-check.  01/05/2018  Adrienne Lopez was seen today in follow-up.  Again she continues to do well.  She denies chest pain or worsening shortness of breath.  She has no palpitations.  Labs from July 2018 showed total cholesterol 195, HDL 66, LDL 111 and triglycerides 92.  Serum creatinine of 1.07.  Blood pressures been stable.  EKG today shows sinus bradycardia at 53.  05/03/2019  Adrienne Lopez is seen today for follow-up of hypertension.  Recently I did a telemedicine visit with her and her blood pressure was elevated.  I asked her to take some home blood pressure readings and submit that to me.  This was done in late May and early June.  Her blood pressures averaged between 130 and 140 systolic in the morning and generally between 115-120 2 in the evening.  We had readjusted her  medications to 50 mg of atenolol  once a day in the morning.  Overall though I am not convinced that we have had very good control of her morning blood pressures.  06/02/2019  Adrienne Lopez returns today for follow-up of hypertension.  After further adjusting her medication it seems that we have now improved her blood pressures adequately.  She is currently taking atenolol  50 mg in the morning and chlorthalidone  12.5 mg daily.  She says this medicine has been difficult to cut up and oftentimes pulverize is the pill.  We discussed the possibility of taking the higher dose atenolol  100 mg / 25 mg combination with chlorthalidone  and breaking it in half since the tablet is scored.  11/19/2020  Adrienne Lopez is seen today in follow-up.  Unfortunately January was not a good month for her.  It sounds like she may have had a TIA.  She had initial symptoms of right arm numbness and tingling as well as heaviness.  She also had unilateral headache and fullness in her ear with hearing loss.  This did resolve after less than 24 hours.  She saw her PCP who ordered a CT scan of her head which apparently showed no acute findings.  She was off of her aspirin  and that was restarted.  She is also now on low-dose atorvastatin .  Labs as of August 2021 showed total cholesterol 246, HDL 71, LDL 157 and triglycerides 894.  If this was TIA then her target is an LDL less than 70.  Her history also suggest that she had TIA in the distant past as well.  She felt some palpitations recently however they were short-lived.  EKG today shows a sinus bradycardia at 59.  01/10/2021  Adrienne Lopez returns today for follow-up.  She underwent a work-up for her TIA including an echo which showed some mild valvular insufficiency but normal LV function and negative for PFO.  Carotid Doppler showed minimal to near normal findings.  She did have a repeat lipid which showed further improvement from her labs a month ago.  Total cholesterol is now 156,  triglycerides 75, HDL 69 and LDL 73 down from an LDL of 90 and 157 approximately 8 months ago.  Overall she feels well.  06/09/2022  Ms. Cavendish is seen today in follow-up.  Overall she seems to be doing pretty well.  She has had no further TIA or strokelike symptoms.  EKG shows a sinus bradycardia.  Her blood pressure is very mildly elevated today.  She did undergo a GI work-up at [facility] including endoscopy which showed gastric antral mucosal inflammation.  This was thought to be due to aspirin  however aspirin  is recommended due to TIA and therefore it was recommended for her to use famotidine in addition to it.  She was keen on stopping both potentially but understands the importance of those medications.  06/26/2023  Adrienne Lopez is seen today in follow-up.  She has had some recent health findings.  She was noted to have elevated liver enzymes necessitating the discontinuation of her statin.  She then underwent a liver biopsy which showed mild chronic  portal inflammation.  She brought a letter today from her liver specialist suggesting that however it was unlikely the cause of your elevated liver enzymes.  And that she should discuss with me the possibility of switching statins.  After being off statin therapy her total cholesterol recently was 221, triglycerides 95, HDL 70 and LDL 134 however recent testing a few days ago showed total cholesterol 175, triglycerides 54, HDL 81 and LDL 83.  05/16/2024  Adrienne Lopez returns today for follow-up.  Her lipids appear to be well-controlled on Nexlizet .  Total cholesterol now 143, triglycerides 53, HDL 72 and LDL 59.  She did have a repeat uric acid which was 8.1.  She is not on any uric acid lowering therapy and did have an episode of gout in the past and will need to monitor closely for this.  We discussed possibility of allopurinol or Uloric today however she was concerned about cost and side effects of the medication.  Blood pressure was elevated today 170/80.   This was also seen earlier in the day at her GYN office.  She said this is unusual for her but was agreeable to monitoring this at home.  PMHx:  Past Medical History:  Diagnosis Date   Cancer (HCC) 09/01/1997   breast, left   Cataract 08/2021   Does not yet require surgery   Elevated liver enzymes 10/29/2022   GERD (gastroesophageal reflux disease) 06/2021   Endoscopy 11/29/21   Hyperparathyroidism, primary (HCC)    Hypertension 06/2019   Osteoporosis    TIA (transient ischemic attack) 12/26/2000   NO RESIDUAL PROBLEM    Past Surgical History:  Procedure Laterality Date   ABDOMINAL HYSTERECTOMY     BREAST LUMPECTOMY  09/01/1997   left   PARATHYROIDECTOMY N/A 05/06/2013   Procedure: PARATHYROIDECTOMY;  Surgeon: Krystal CHRISTELLA Spinner, MD;  Location: [facility];  Service: General;  Laterality: N/A;   PARTIAL HYSTERECTOMY  09/01/1989    FAMHx:  Family History  Problem Relation Age of Onset   Hypertension Mother    Prostate cancer Father    Diabetes Father    Heart attack Maternal Grandmother    Heart disease Maternal Grandmother    Cancer Maternal Grandfather        oral cancer    SOCHx:   reports that she has never smoked. She has never used smokeless tobacco. She reports that she does not drink alcohol and does not use drugs.  ALLERGIES:  No Known Allergies  ROS: Pertinent items noted in HPI and remainder of comprehensive ROS otherwise negative.  HOME MEDS: Current Outpatient Medications  Medication Sig Dispense Refill   Ascorbic Acid (VITAMIN C) 1000 MG tablet Take 1,000 mg by mouth daily.     aspirin  81 MG tablet Take 81 mg by mouth daily.     atenolol  (TENORMIN ) 50 MG tablet Take 1 tablet (50 mg total) by mouth daily. 90 tablet 3   B Complex-C (B-COMPLEX WITH VITAMIN C) tablet Take 1 tablet by mouth daily. 100mg      Bempedoic Acid -Ezetimibe (NEXLIZET ) 180-10 MG TABS Take 1 tablet by mouth daily. 90 tablet 3   BIOTIN 5000 PO Take 1 tablet by mouth daily.       Cholecalciferol (VITAMIN D-3) 125 MCG (5000 UT) TABS Take 5,000 Units by mouth daily at 6 (six) AM.     Cyanocobalamin (VITAMIN B-12 PO) Take 2,500 mg by mouth daily.     losartan  (COZAAR ) 50 MG tablet Take 1 tablet (50 mg total) by mouth  daily. 90 tablet 3   cholecalciferol (VITAMIN D) 1000 UNITS tablet Take 5,000 Units by mouth daily.     No current facility-administered medications for this visit.    LABS/IMAGING: No results found for this or any previous visit (from the past 48 hours). No results found.  VITALS: BP (!) 170/80 (BP Location: Left Arm, Patient Position: Sitting, Cuff Size: Normal)   Pulse (!) 55   Ht 5' 3.5 (1.613 m)   Wt 137 lb (62.1 kg)   SpO2 99%   BMI 23.89 kg/m   EXAM: General appearance: alert and no distress Lungs: clear to auscultation bilaterally Heart: regular rate and rhythm, S1, S2 normal, no murmur, click, rub or gallop Psych: Pleasant  EKG: EKG Interpretation Date/Time:  Monday May 16 2024 15:45:45 EDT Ventricular Rate:  55 PR Interval:  164 QRS Duration:  76 QT Interval:  392 QTC Calculation: 375 R Axis:   34  Text Interpretation: Sinus bradycardia When compared with ECG of 26-Jun-2023 11:16, No significant change was found Confirmed by Mona Kent [phone]) on 05/16/2024 3:59:52 PM    ASSESSMENT: Chest pain-resolved, low risk NST on 08/04/2013 Continued palpitations - PAC's and PVC;s with bigeminy Recent hyperparathyroidism status post surgery 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 Gastritis Elevated liver enzymes with a liver biopsy showing mild chronic inflammation  PLAN: 1.   Ms. Tarleton seems to be doing well without chest pain or shortness of breath.  Her blood pressure is noted to be elevated today to different offices.  I encouraged her to take home blood pressure readings and we  may need to further adjust her medications.  Her cholesterol is well treated.  Her uric acid level however is not at goal less than 6 given a history of gout.  We talked about increased risk of potential gout on bempedoic acid  and that she may need to consider being on allopurinol or Uloric however medication cost and side effects at this time are concerning for.  Will advise her on medication changes for hypertension once we see her home blood pressure readings.  Plan otherwise follow-up in 6 months or sooner as necessary  Kent KYM Mona, MD, [facility], FNLA, FACP  [place]  [facility]  Medical Director of the [facility]  [facility] Diplomate of the American Board of Clinical Lipidology Attending Cardiologist  Direct Dial: [phone]  Fax: [phone]  Website:  [email or web address]    Kent BROCKS Chen Holzman 05/16/2024, 4:27 PM

## 2024-05-24 ENCOUNTER — Ambulatory Visit: Payer: Self-pay | Admitting: Family Medicine

## 2024-05-24 LAB — SURGICAL PATHOLOGY

## 2024-05-28 ENCOUNTER — Encounter: Payer: Self-pay | Admitting: Internal Medicine

## 2024-06-30 ENCOUNTER — Ambulatory Visit: Payer: Self-pay

## 2024-07-01 LAB — HM DEXA SCAN

## 2024-07-04 ENCOUNTER — Encounter: Payer: Self-pay | Admitting: Nurse Practitioner

## 2024-07-04 ENCOUNTER — Encounter: Payer: Self-pay | Admitting: Radiology

## 2024-07-07 ENCOUNTER — Telehealth: Payer: Self-pay

## 2024-07-07 ENCOUNTER — Encounter: Payer: Self-pay | Admitting: Nurse Practitioner

## 2024-07-07 NOTE — Telephone Encounter (Signed)
 Copied from CRM #[id number]. Topic: General - Other >> Jul 07, 2024  3:44 PM Thersia C wrote: Reason for CRM: Patient called in stated she received a letter from her insurance stated NP Gaines Ada will no longer be her provider, wanted to know what she needs to do as she has appointment with her coming up . Would like a callback regarding this.  LVM for patient to bring letter to office so we can see letter.

## 2024-07-11 ENCOUNTER — Other Ambulatory Visit: Payer: Self-pay | Admitting: Nurse Practitioner

## 2024-07-27 ENCOUNTER — Ambulatory Visit

## 2024-07-29 ENCOUNTER — Other Ambulatory Visit: Payer: Self-pay | Admitting: Nurse Practitioner

## 2024-08-02 ENCOUNTER — Other Ambulatory Visit: Payer: Self-pay | Admitting: Nurse Practitioner

## 2024-08-02 ENCOUNTER — Encounter: Payer: Self-pay | Admitting: Nurse Practitioner

## 2024-08-02 DIAGNOSIS — E782 Mixed hyperlipidemia: Secondary | ICD-10-CM

## 2024-08-08 ENCOUNTER — Other Ambulatory Visit: Payer: Self-pay | Admitting: Nurse Practitioner

## 2024-08-08 DIAGNOSIS — E782 Mixed hyperlipidemia: Secondary | ICD-10-CM

## 2024-08-09 ENCOUNTER — Telehealth: Payer: Self-pay | Admitting: Pharmacist

## 2024-08-09 DIAGNOSIS — E782 Mixed hyperlipidemia: Secondary | ICD-10-CM

## 2024-08-09 NOTE — Progress Notes (Unsigned)
.\    08/09/2024 Name: Adrienne Lopez MRN: [id number] DOB: [birth year]  Chief Complaint  Patient presents with   Medication Assistance    Nexlizet     {Visit Ubez:73349}   Subjective:  Care Team: Primary Care Provider: Georgina Speaks, FNP ; Next Scheduled Visit: *** {careteamprovider:27366}  Medication Access/Adherence  Current Pharmacy:  [id number] - [place] ([place]), [place] - [address] [place]) [place] [address] Phone: [phone] Fax: [phone]   Patient reports affordability concerns with their medications: {YES/NO:21197} Patient reports access/transportation concerns to their pharmacy: {YES/NO:21197} Patient reports adherence concerns with their medications:  {YES/NO:21197} ***   {Pharmacy S/O Choices:26420}   Objective:  Lab Results  Component Value Date   HGBA1C 5.7 (H) 05/11/2024    Lab Results  Component Value Date   CREATININE 0.99 05/11/2024   BUN 30 (H) 05/11/2024   NA 140 05/11/2024   K 4.0 05/11/2024   CL 101 05/11/2024   CO2 23 05/11/2024    Lab Results  Component Value Date   CHOL 143 05/11/2024   HDL 72 05/11/2024   LDLCALC 59 05/11/2024   TRIG 53 05/11/2024   CHOLHDL 2.0 05/11/2024    Medications Reviewed Today   Medications were not reviewed in this encounter       Assessment/Plan:   [employer] ID [id number] Reference Number [id number] [name] Hypercholesterolemia - Medicare Access Assistance Type Co-pay Start Date 07/10/2024 End Date 07/09/2025   Card No. [id number] Card Status Active BIN [id number] PCN [id number] [employer] [facility] [id number] Help Desk [phone] Provider PDMI Processor PDMI  {Pharmacy A/P Choices:26421}  Follow Up Plan: ***  ***

## 2024-08-10 ENCOUNTER — Ambulatory Visit: Payer: Self-pay

## 2024-08-10 VITALS — BP 150/80 | HR 57 | Temp 98.1°F | Ht 63.5 in | Wt 138.0 lb

## 2024-08-10 DIAGNOSIS — Z0000 Encounter for general adult medical examination without abnormal findings: Secondary | ICD-10-CM

## 2024-08-10 NOTE — Progress Notes (Cosign Needed Addendum)
 Chief Complaint  Patient presents with   Medicare Wellness     Subjective:   Adrienne Lopez is a 73 y.o. female who presents for a Medicare Annual Wellness Visit.  Visit info / Clinical Intake: Medicare Wellness Visit Type:: Subsequent Annual Wellness Visit Persons participating in visit and providing information:: patient Medicare Wellness Visit Mode:: In-person (required for WTM) Interpreter Needed?: No Pre-visit prep was completed: yes AWV questionnaire completed by patient prior to visit?: yes Date:: 08/06/24 Living arrangements:: (!) lives alone Patient's Overall Health Status Rating: very good Typical amount of pain: none Does pain affect daily life?: no Are you currently prescribed opioids?: no  Dietary Habits and Nutritional Risks How many meals a day?: 3 Eats fruit and vegetables daily?: yes Most meals are obtained by: preparing own meals In the last 2 weeks, have you had any of the following?: none Diabetic:: no  Functional Status Activities of Daily Living (to include ambulation/medication): Independent Ambulation: Independent Medication Administration: Independent Home Management (perform basic housework or laundry): Independent Manage your own finances?: yes Primary transportation is: driving Concerns about vision?: no *vision screening is required for WTM* Concerns about hearing?: no  Fall Screening Falls in the past year?: 0 Number of falls in past year: 0 Was there an injury with Fall?: 0 Fall Risk Category Calculator: 0 Patient Fall Risk Level: Low Fall Risk  Fall Risk Patient at Risk for Falls Due to: Medication side effect Fall risk Follow up: Falls prevention discussed; Falls evaluation completed  Home and Transportation Safety: All rugs have non-skid backing?: yes All stairs or steps have railings?: yes Grab bars in the bathtub or shower?: (!) no Have non-skid surface in bathtub or shower?: yes Good home lighting?: yes Regular seat belt  use?: yes Hospital stays in the last year:: no  Cognitive Assessment Difficulty concentrating, remembering, or making decisions? : no Will 6CIT or Mini Cog be Completed: yes What year is it?: 0 points What month is it?: 0 points Give patient an address phrase to remember (5 components): [address] About what time is it?: 0 points Count backwards from 20 to 1: 0 points Say the months of the year in reverse: 0 points Repeat the address phrase from earlier: 0 points 6 CIT Score: 0 points  Advance Directives (For Healthcare) Does Patient Have a Medical Advance Directive?: No Would patient like information on creating a medical advance directive?: No - Patient declined  Reviewed/Updated  Reviewed/Updated: Reviewed All (Medical, Surgical, Family, Medications, Allergies, Care Teams, Patient Goals)    Allergies (verified) Patient has no known allergies.   Current Medications (verified) Outpatient Encounter Medications as of 08/10/2024  Medication Sig   Ascorbic Acid (VITAMIN C) 1000 MG tablet Take 1,000 mg by mouth daily.   aspirin  81 MG tablet Take 81 mg by mouth daily.   atenolol  (TENORMIN ) 50 MG tablet Take 1 tablet by mouth once daily   B Complex-C (B-COMPLEX WITH VITAMIN C) tablet Take 1 tablet by mouth daily. 100mg    BIOTIN 5000 PO Take 1 tablet by mouth daily.    cholecalciferol (VITAMIN D) 1000 UNITS tablet Take 5,000 Units by mouth daily.   Cholecalciferol (VITAMIN D-3) 125 MCG (5000 UT) TABS Take 5,000 Units by mouth daily at 6 (six) AM.   Cyanocobalamin (VITAMIN B-12 PO) Take 2,500 mg by mouth daily.   losartan  (COZAAR ) 50 MG tablet Take 1 tablet (50 mg total) by mouth daily.   NEXLIZET  180-10 MG TABS Take 1 tablet by mouth  once daily   No facility-administered encounter medications on file as of 08/10/2024.    History: Past Medical History:  Diagnosis Date   Cancer (HCC) 09/01/1997   breast, left   Cataract 08/2021   Does not yet require surgery    Elevated liver enzymes 10/29/2022   GERD (gastroesophageal reflux disease) 06/2021   Endoscopy 11/29/21   Hyperparathyroidism, primary    Hypertension 06/2019   Osteoporosis    TIA (transient ischemic attack) 12/26/2000   NO RESIDUAL PROBLEM   Past Surgical History:  Procedure Laterality Date   ABDOMINAL HYSTERECTOMY     BREAST LUMPECTOMY  09/01/1997   left   PARATHYROIDECTOMY N/A 05/06/2013   Procedure: PARATHYROIDECTOMY;  Surgeon: Krystal CHRISTELLA Spinner, MD;  Location: [facility];  Service: General;  Laterality: N/A;   PARTIAL HYSTERECTOMY  09/01/1989   Family History  Problem Relation Age of Onset   Hypertension Mother    Prostate cancer Father    Diabetes Father    Heart attack Maternal Grandmother    Heart disease Maternal Grandmother    Cancer Maternal Grandfather        oral cancer   Social History   Occupational History   Not on file  Tobacco Use   Smoking status: Never   Smokeless tobacco: Never  Vaping Use   Vaping status: Never Used  Substance and Sexual Activity   Alcohol use: Never   Drug use: Never   Sexual activity: Not Currently    Birth control/protection: None   Tobacco Counseling Counseling given: Not Answered  SDOH Screenings   Food Insecurity: No Food Insecurity (08/06/2024)  Housing: Low Risk  (08/06/2024)  Transportation Needs: No Transportation Needs (08/06/2024)  Utilities: Not At Risk (08/10/2024)  Alcohol Screen: Low Risk  (08/06/2024)  Depression (PHQ2-9): Low Risk  (08/10/2024)  Financial Resource Strain: Low Risk  (08/06/2024)  Physical Activity: Inactive (08/06/2024)  Social Connections: Socially Isolated (08/06/2024)  Stress: No Stress Concern Present (08/06/2024)  Tobacco Use: Low Risk  (08/10/2024)  Health Literacy: Adequate Health Literacy (08/10/2024)   See flowsheets for full screening details  Depression Screen PHQ 2 & 9 Depression Scale- Over the past 2 weeks, how often have you been bothered by any of the following problems? Little  interest or pleasure in doing things: 0 Feeling down, depressed, or hopeless (PHQ Adolescent also includes...irritable): 0 PHQ-2 Total Score: 0 Trouble falling or staying asleep, or sleeping too much: 0 Feeling tired or having little energy: 0 Poor appetite or overeating (PHQ Adolescent also includes...weight loss): 0 Feeling bad about yourself - or that you are a failure or have let yourself or your family down: 0 Trouble concentrating on things, such as reading the newspaper or watching television (PHQ Adolescent also includes...like school work): 0 Moving or speaking so slowly that other people could have noticed. Or the opposite - being so fidgety or restless that you have been moving around a lot more than usual: 0 Thoughts that you would be better off dead, or of hurting yourself in some way: 0 PHQ-9 Total Score: 0 If you checked off any problems, how difficult have these problems made it for you to do your work, take care of things at home, or get along with other people?: Not difficult at all  Depression Treatment Depression Interventions/Treatment : EYV7-0 Score <4 Follow-up Not Indicated     Goals Addressed             This Visit's Progress    Patient Stated  08/10/2024, denies goals             Objective:    Today's Vitals   08/10/24 1216 08/10/24 1229  BP: (!) 160/80 (!) 150/80  Pulse: (!) 57   Temp: 98.1 F (36.7 C)   TempSrc: Oral   SpO2: 99%   Weight: 138 lb (62.6 kg)   Height: 5' 3.5 (1.613 m)    Body mass index is 24.06 kg/m.  Hearing/Vision screen Hearing Screening - Comments:: Denies hearing issues Vision Screening - Comments:: Regular eye exams, Dr. Cyrus Immunizations and Health Maintenance Health Maintenance  Topic Date Due   COVID-19 Vaccine (7 - 2025-26 season) 05/02/2024   Mammogram  09/28/2024   Influenza Vaccine  11/29/2024 (Originally 04/01/2024)   Medicare Annual Wellness (AWV)  08/10/2025   Colonoscopy  09/06/2028    DTaP/Tdap/Td (2 - Td or Tdap) 05/18/2030   Pneumococcal Vaccine: 50+ Years  Completed   Bone Density Scan  Completed   Hepatitis C Screening  Completed   Zoster Vaccines- Shingrix   Completed   Meningococcal B Vaccine  Aged Out        Assessment/Plan:  This is a routine wellness examination for Jorie.  Patient Care Team: Georgina Speaks, FNP as PCP - General (General Practice) Mona Vinie BROCKS, MD as PCP - Cardiology (Cardiology) Cyrus Carwin, MD as Consulting Physician (Ophthalmology) Jolee Cassius PARAS, [facility] as Pharmacist (Pharmacist)  I have personally reviewed and noted the following in the patients chart:   Medical and social history Use of alcohol, tobacco or illicit drugs  Current medications and supplements including opioid prescriptions. Functional ability and status Nutritional status Physical activity Advanced directives List of other physicians Hospitalizations, surgeries, and ER visits in previous 12 months Vitals Screenings to include cognitive, depression, and falls Referrals and appointments  No orders of the defined types were placed in this encounter.  In addition, I have reviewed and discussed with patient certain preventive protocols, quality metrics, and best practice recommendations. A written personalized care plan for  preventive services as well as general preventive health recommendations were provided to patient.   Ardella FORBES Dawn, LPN   [id number]   Return in 1 year (on 08/10/2025).  After Visit Summary: (In Person-Declined) Patient declined AVS at this time.  Nurse Notes: Vaccines not given: Will obtain covid at pharmacy

## 2024-08-10 NOTE — Patient Instructions (Signed)
 [name],  Thank you for taking the time for your Medicare Wellness Visit. I appreciate your continued commitment to your health goals. Please review the care plan we discussed, and feel free to reach out if I can assist you further.  Please note that Annual Wellness Visits do not include a physical exam. Some assessments may be limited, especially if the visit was conducted virtually. If needed, we may recommend an in-person follow-up with your provider.  Ongoing Care Seeing your primary care provider every 3 to 6 months helps us  monitor your health and provide consistent, personalized care.   Referrals If a referral was made during today's visit and you haven't received any updates within two weeks, please contact the referred provider directly to check on the status.  Recommended Screenings:  Health Maintenance  Topic Date Due   COVID-19 Vaccine (7 - 2025-26 season) 05/02/2024   Medicare Annual Wellness Visit  06/24/2024   Breast Cancer Screening  09/28/2024   Flu Shot  11/29/2024*   Colon Cancer Screening  09/06/2028   DTaP/Tdap/Td vaccine (2 - Td or Tdap) 05/18/2030   Pneumococcal Vaccine for age over 18  Completed   Osteoporosis screening with Bone Density Scan  Completed   Hepatitis C Screening  Completed   Zoster (Shingles) Vaccine  Completed   Meningitis B Vaccine  Aged Out  *Topic was postponed. The date shown is not the original due date.       08/10/2024   12:19 PM  Advanced Directives  Does Patient Have a Medical Advance Directive? No  Would patient like information on creating a medical advance directive? No - Patient declined    Vision: Annual vision screenings are recommended for early detection of glaucoma, cataracts, and diabetic retinopathy. These exams can also reveal signs of chronic conditions such as diabetes and high blood pressure.  Dental: Annual dental screenings help detect early signs of oral cancer, gum disease, and other conditions linked to  overall health, including heart disease and diabetes.  Please see the attached documents for additional preventive care recommendations.

## 2024-11-07 ENCOUNTER — Ambulatory Visit: Payer: Self-pay | Admitting: Nurse Practitioner

## 2024-12-12 ENCOUNTER — Ambulatory Visit: Admitting: Internal Medicine

## 2025-05-11 ENCOUNTER — Encounter: Payer: Self-pay | Admitting: Nurse Practitioner
